# Patient Record
Sex: Male | Born: 1945
Health system: Southern US, Community
[De-identification: ages and names within clinical notes are randomized; demographics above are authoritative.]

## PROBLEM LIST (undated history)

## (undated) DIAGNOSIS — E119 Type 2 diabetes mellitus without complications: Secondary | ICD-10-CM

## (undated) DIAGNOSIS — I10 Essential (primary) hypertension: Secondary | ICD-10-CM

## (undated) DIAGNOSIS — E785 Hyperlipidemia, unspecified: Secondary | ICD-10-CM

## (undated) DIAGNOSIS — M1990 Unspecified osteoarthritis, unspecified site: Secondary | ICD-10-CM

## (undated) DIAGNOSIS — G2581 Restless legs syndrome: Secondary | ICD-10-CM

## (undated) DIAGNOSIS — N529 Male erectile dysfunction, unspecified: Secondary | ICD-10-CM

## (undated) DIAGNOSIS — B029 Zoster without complications: Secondary | ICD-10-CM

## (undated) HISTORY — PX: JOINT REPLACEMENT: SHX530

## (undated) HISTORY — DX: Unspecified osteoarthritis, unspecified site: M19.90

## (undated) HISTORY — DX: Essential (primary) hypertension: I10

## (undated) HISTORY — DX: Hyperlipidemia, unspecified: E78.5

## (undated) HISTORY — DX: Male erectile dysfunction, unspecified: N52.9

## (undated) HISTORY — DX: Restless legs syndrome: G25.81

## (undated) HISTORY — PX: SPINE SURGERY: SHX786

## (undated) HISTORY — DX: Type 2 diabetes mellitus without complications: E11.9

## (undated) HISTORY — DX: Zoster without complications: B02.9

---

## 2000-02-07 ENCOUNTER — Encounter: Admission: RE | Admit: 2000-02-07 | Discharge: 2000-02-07 | Payer: Self-pay | Admitting: Infectious Diseases

## 2007-10-25 ENCOUNTER — Ambulatory Visit (HOSPITAL_COMMUNITY): Admission: RE | Admit: 2007-10-25 | Discharge: 2007-10-25 | Payer: Self-pay | Admitting: Urology

## 2010-11-09 ENCOUNTER — Encounter: Payer: Self-pay | Admitting: Physician Assistant

## 2010-11-09 DIAGNOSIS — E119 Type 2 diabetes mellitus without complications: Secondary | ICD-10-CM | POA: Insufficient documentation

## 2010-11-09 DIAGNOSIS — I10 Essential (primary) hypertension: Secondary | ICD-10-CM

## 2010-12-09 LAB — HM DIABETES EYE EXAM

## 2010-12-14 ENCOUNTER — Encounter: Payer: Self-pay | Admitting: Physician Assistant

## 2010-12-14 NOTE — Op Note (Signed)
NAMEWILLIAN, DONSON                 ACCOUNT NO.:  [id number]   MEDICAL RECORD NO.:  [id number]          PATIENT TYPE:  AMB   LOCATION:  DAY                          FACILITY:  [facility]   PHYSICIAN:  Excell Seltzer. Annabell Howells, M.D.    DATE OF BIRTH:  [birth year]   DATE OF PROCEDURE:  10/25/2007  DATE OF DISCHARGE:                               OPERATIVE REPORT   PREOPERATIVE DIAGNOSIS:  Phimosis and balanitis.   POSTOPERATIVE DIAGNOSIS:  Phimosis and balanitis.   SURGEON:  Dr. Bjorn Pippin.   ANESTHESIA:  General.   SPECIMEN:  None.   COMPLICATIONS:  None.   INDICATIONS:  Bary is a 65 year old white male sent by Dr. Elana Alm for  progressive phimosis and intermittent balanitis.  He has elected  circumcision for therapy.   PROCEDURE:  The patient was taken to the operating room, where general  anesthetic was induced.  He was placed in the supine position.  His  genitalia was prepped with Betadine solution and draped in the usual  sterile fashion.  Dorsal and ventral slits were made and the redundant  wings of foreskin were excised.  Bleeding was controlled with the Bovie.  The skin edges were reapproximated with running 4-0 chromic interrupted  in the quadrants.  A dressing of Xeroform, Kling and Coban was placed,  and a penile block was performed with 0.5% Marcaine 8 mL.  His  anesthetic was reversed and he was moved to the recovery room in stable  condition.  There were no complications.      Excell Seltzer. Annabell Howells, M.D.  Electronically Signed     JJW/MEDQ  D:  10/25/2007  T:  10/25/2007  Job:  [id number]   cc:   S. Kyra Manges, M.D.  Fax: [phone]

## 2011-04-25 LAB — HEMOGLOBIN AND HEMATOCRIT, BLOOD
HCT: 39.3
Hemoglobin: 13.4

## 2011-04-25 LAB — URINALYSIS, ROUTINE W REFLEX MICROSCOPIC
Nitrite: NEGATIVE
Specific Gravity, Urine: 1.017
Urobilinogen, UA: 0.2

## 2011-04-25 LAB — BASIC METABOLIC PANEL
Chloride: 107
GFR calc Af Amer: >60
Potassium: 4.5
Sodium: 142

## 2012-10-15 ENCOUNTER — Other Ambulatory Visit: Payer: Self-pay | Admitting: Family Medicine

## 2012-10-15 LAB — COMPLETE METABOLIC PANEL WITH GFR
ALT: 16 U/L (ref 0–53)
AST: 18 U/L (ref 0–37)
Albumin: 4.3 g/dL (ref 3.5–5.2)
Alkaline Phosphatase: 57 U/L (ref 39–117)
BUN: 22 mg/dL (ref 6–23)
CO2: 30 mEq/L (ref 19–32)
Calcium: 9.9 mg/dL (ref 8.4–10.5)
Chloride: 101 mEq/L (ref 96–112)
Creat: 1.13 mg/dL (ref 0.50–1.35)
GFR, Est African American: 78 mL/min
GFR, Est Non African American: 67 mL/min
Glucose, Bld: 115 mg/dL — ABNORMAL HIGH (ref 70–99)
Potassium: 4.5 mEq/L (ref 3.5–5.3)
Sodium: 139 mEq/L (ref 135–145)
Total Bilirubin: 0.9 mg/dL (ref 0.3–1.2)
Total Protein: 7.4 g/dL (ref 6.0–8.3)

## 2012-10-16 LAB — MICROALBUMIN, URINE: Microalb, Ur: 1.61 mg/dL (ref 0.00–1.89)

## 2012-10-18 LAB — NMR LIPOPROFILE WITH LIPIDS
Cholesterol, Total: 170 mg/dL (ref <200)
HDL Particle Number: 29.9 umol/L — ABNORMAL LOW (ref >=30.5)
HDL Size: 8.5 nm — ABNORMAL LOW (ref >=9.2)
HDL-C: 44 mg/dL (ref >=40)
LDL (calc): 112 mg/dL — ABNORMAL HIGH (ref <100)
LDL Particle Number: 1137 nmol/L — ABNORMAL HIGH (ref <1000)
LDL Size: 21.7 nm (ref >20.5)
LP-IR Score: 51 — ABNORMAL HIGH (ref <=45)
Large HDL-P: 2.3 umol/L — ABNORMAL LOW (ref >=4.8)
Large VLDL-P: 1.8 nmol/L (ref <=2.7)
Small LDL Particle Number: 253 nmol/L (ref <=527)
Triglycerides: 72 mg/dL (ref <150)
VLDL Size: 41.7 nm (ref >=46.6)

## 2012-12-02 ENCOUNTER — Other Ambulatory Visit: Payer: Self-pay | Admitting: Family Medicine

## 2012-12-30 ENCOUNTER — Other Ambulatory Visit: Payer: Self-pay | Admitting: Family Medicine

## 2013-01-27 ENCOUNTER — Other Ambulatory Visit: Payer: Self-pay | Admitting: Family Medicine

## 2013-02-14 ENCOUNTER — Encounter: Payer: Self-pay | Admitting: Family Medicine

## 2013-02-14 ENCOUNTER — Ambulatory Visit (INDEPENDENT_AMBULATORY_CARE_PROVIDER_SITE_OTHER): Payer: Medicare PPO | Admitting: Family Medicine

## 2013-02-14 VITALS — BP 124/73 | HR 56 | Temp 97.0°F | Ht 71.0 in | Wt 290.4 lb

## 2013-02-14 DIAGNOSIS — F17200 Nicotine dependence, unspecified, uncomplicated: Secondary | ICD-10-CM

## 2013-02-14 DIAGNOSIS — E669 Obesity, unspecified: Secondary | ICD-10-CM

## 2013-02-14 DIAGNOSIS — E785 Hyperlipidemia, unspecified: Secondary | ICD-10-CM

## 2013-02-14 DIAGNOSIS — N529 Male erectile dysfunction, unspecified: Secondary | ICD-10-CM | POA: Insufficient documentation

## 2013-02-14 DIAGNOSIS — M25559 Pain in unspecified hip: Secondary | ICD-10-CM

## 2013-02-14 DIAGNOSIS — M25552 Pain in left hip: Secondary | ICD-10-CM | POA: Insufficient documentation

## 2013-02-14 DIAGNOSIS — I10 Essential (primary) hypertension: Secondary | ICD-10-CM

## 2013-02-14 DIAGNOSIS — E119 Type 2 diabetes mellitus without complications: Secondary | ICD-10-CM

## 2013-02-14 LAB — COMPLETE METABOLIC PANEL WITH GFR
ALT: 20 U/L (ref 0–53)
AST: 19 U/L (ref 0–37)
Albumin: 4.2 g/dL (ref 3.5–5.2)
Alkaline Phosphatase: 49 U/L (ref 39–117)
BUN: 17 mg/dL (ref 6–23)
CO2: 28 mEq/L (ref 19–32)
Calcium: 9.5 mg/dL (ref 8.4–10.5)
Chloride: 102 mEq/L (ref 96–112)
Creat: 0.95 mg/dL (ref 0.50–1.35)
GFR, Est African American: >89 mL/min
GFR, Est Non African American: 82 mL/min
Glucose, Bld: 104 mg/dL — ABNORMAL HIGH (ref 70–99)
Potassium: 4.7 mEq/L (ref 3.5–5.3)
Sodium: 138 mEq/L (ref 135–145)
Total Bilirubin: 1.1 mg/dL (ref 0.3–1.2)
Total Protein: 7.1 g/dL (ref 6.0–8.3)

## 2013-02-14 LAB — POCT UA - MICROALBUMIN: Microalbumin Ur, POC: 20 mg/L

## 2013-02-14 LAB — POCT GLYCOSYLATED HEMOGLOBIN (HGB A1C): Hemoglobin A1C: 6.1

## 2013-02-14 MED ORDER — PIOGLITAZONE HCL 30 MG PO TABS: ORAL_TABLET | ORAL | Status: DC

## 2013-02-14 MED ORDER — LOSARTAN POTASSIUM-HCTZ 100-12.5 MG PO TABS: ORAL_TABLET | ORAL | Status: DC

## 2013-02-14 MED ORDER — PRAVASTATIN SODIUM 20 MG PO TABS: 20.0000 mg | ORAL_TABLET | Freq: Every day | ORAL | Status: DC

## 2013-02-14 MED ORDER — METHYLPREDNISOLONE ACETATE 80 MG/ML IJ SUSP
80.0000 mg | Freq: Once | INTRAMUSCULAR | Status: AC
  Administered 2013-02-14: 80 mg via INTRAMUSCULAR

## 2013-02-14 NOTE — Progress Notes (Signed)
Patient ID: Brett Jensen, male   DOB: 17-Aug-1945, 67 y.o.   MRN: [id number] SUBJECTIVE: CC: Chief Complaint  Patient presents with  . Follow-up    4 month follow up  c/o left hip pain   . Medication Refill    needs refillls     HPI: 1)Patient is here for follow up of Diabetes Mellitus: Symptoms of DM: Denies Nocturia ,Denies Urinary Frequency , denies Blurred vision ,deniesDizziness,denies.Dysuria,denies paresthesias, denies extremity pain or ulcers.Brett Kitchendenies chest pain. has had an annual eye exam. do check the feet. Does check CBGs. Average CBG:113 Denies episodes of hypoglycemia. Does have an emergency hypoglycemic plan. admits toCompliance with medications. Denies Problems with medications.  Breakfast:1 boiled egg Lunch: chicken sandwich Dinner: 2 tomato sandwiches.  2)Patient is here for follow up of hypertension: denies Headache;deniesChest Pain;denies weakness;denies Shortness of Breath or Orthopnea;denies Visual changes;denies palpitations;denies cough;denies pedal edema;denies symptoms of TIA or stroke; admits to Compliance with medications. denies Problems with medications.  3) left hip pain : 2 months ago started to hurt while he was walking. He wants to get a steroid shot in the left buttocks  Even though it may make his  Sugar go high.  Past Medical History  Diagnosis Date  . Diabetes mellitus without complication   . Hypertension   . Restless leg syndrome   . Hyperlipidemia   . Erectile dysfunction   . Shingles    Past Surgical History  Procedure Laterality Date  . Spine surgery      lower back  . Joint replacement Right     ankle   History   Social History  . Marital Status: Widowed    Spouse Name: N/A    Number of Children: N/A  . Years of Education: N/A   Occupational History  . Not on file.   Social History Main Topics  . Smoking status: Current Every Day Smoker    Types: Cigars  . Smokeless tobacco: Not on file  . Alcohol Use: No  . Drug  Use: No  . Sexually Active: Not on file   Other Topics Concern  . Not on file   Social History Narrative  . No narrative on file   Family History  Problem Relation Age of Onset  . Cancer Mother   . Cancer Father    Current Outpatient Prescriptions on File Prior to Visit  Medication Sig Dispense Refill  . losartan-hydrochlorothiazide (HYZAAR) 100-12.5 MG per tablet TAKE ONE TABLET BY MOUTH ONE TIME DAILY  30 tablet  2  . pioglitazone (ACTOS) 30 MG tablet TAKE ONE TABLET BY MOUTH ONE TIME DAILY  30 tablet  1   No current facility-administered medications on file prior to visit.   No Known Allergies  There is no immunization history on file for this patient. Prior to Admission medications   Medication Sig Start Date End Date Taking? Authorizing Provider  losartan-hydrochlorothiazide (HYZAAR) 100-12.5 MG per tablet TAKE ONE TABLET BY MOUTH ONE TIME DAILY 01/27/13  Yes Ileana Ladd, MD  pioglitazone (ACTOS) 30 MG tablet TAKE ONE TABLET BY MOUTH ONE TIME DAILY 12/30/12  Yes Ileana Ladd, MD  pravastatin (PRAVACHOL) 20 MG tablet Take by mouth daily.  01/27/13  Yes Historical Provider, MD    ROS: As above in the HPI. All other systems are stable or negative.  OBJECTIVE: APPEARANCE:  Patient in no acute distress.The patient appeared well nourished and normally developed. Acyanotic. Waist: VITAL SIGNS:BP 124/73  Pulse 56  Temp(Src) 97 F (36.1 C) (  Oral)  Ht 5\' 11"  (1.803 m)  Wt 290 lb 6.4 oz (131.725 kg)  BMI 40.52 kg/m2 WM obese  SKIN: warm and  Dry without overt rashes, tattoos and scars  HEAD and Neck: without JVD, Head and scalp: normal Eyes:No scleral icterus. Fundi normal, eye movements normal. Ears: Auricle normal, canal normal, Tympanic membranes normal, insufflation normal. Nose: normal Throat: normal Neck & thyroid: normal  CHEST & LUNGS: Chest wall: normal Lungs: Clear  CVS: Reveals the PMI to be normally located. Regular rhythm, First and Second Heart  sounds are normal,  absence of murmurs, rubs or gallops. Peripheral vasculature: Radial pulses: normal Dorsal pedis pulses: normal Posterior pulses: normal  ABDOMEN:  Appearance: obese Benign, no organomegaly, no masses, no Abdominal Aortic enlargement. No Guarding , no rebound. No Bruits. Bowel sounds: normal  RECTAL: N/A GU: N/A  EXTREMETIES: nonedematous. Both Femoral and Pedal pulses are normal.  MUSCULOSKELETAL:  Spine: normal Joints: left hip pain on ROM especially on internal rotation Right ankle surgical scars  NEUROLOGIC: oriented to time,place and person; nonfocal. Strength is normal Sensory is normal Reflexes are normal Cranial Nerves are normal.  ASSESSMENT: Left hip pain - Plan: methylPREDNISolone acetate (DEPO-MEDROL) injection 80 mg  Obesity, unspecified  HTN (hypertension) - Plan: losartan-hydrochlorothiazide (HYZAAR) 100-12.5 MG per tablet, COMPLETE METABOLIC PANEL WITH GFR  DM (diabetes mellitus) - Plan: pioglitazone (ACTOS) 30 MG tablet, POCT glycosylated hemoglobin (Hb A1C), POCT UA - Microalbumin, COMPLETE METABOLIC PANEL WITH GFR, Microalbumin, urine  Erectile dysfunction  HLD (hyperlipidemia) - Plan: pravastatin (PRAVACHOL) 20 MG tablet, COMPLETE METABOLIC PANEL WITH GFR, NMR Lipoprofile with Lipids    PLAN:      Dr Woodroe Mode Recommendations  Diet and Exercise discussed with patient.  For nutrition information, I recommend books:  1).Eat to Live by Dr Monico Hoar. 2).Prevent and Reverse Heart Disease by Dr Suzzette Righter. 3) Dr Katherina Right Book:  Program to Reverse Diabetes  Exercise recommendations are:  If unable to walk, then the patient can exercise in a chair 3 times a day. By flapping arms like a bird gently and raising legs outwards to the front.  If ambulatory, the patient can go for walks for 30 minutes 3 times a week. Then increase the intensity and duration as tolerated.  Goal is to try to attain exercise  frequency to 5 times a week.  If applicable: Best to perform resistance exercises (machines or weights) 2 days a week and cardio type exercises 3 days per week.  Counseled on Obesity and DM and  Smoking cessation Counseled on smoking cessation for 8 minutes. Lifestyle changes needed and weight loss needed Hand outs in the AVS .                             Orders Placed This Encounter  Procedures  . COMPLETE METABOLIC PANEL WITH GFR  . NMR Lipoprofile with Lipids  . Microalbumin, urine  . POCT glycosylated hemoglobin (Hb A1C)  . POCT UA - Microalbumin   Meds ordered this encounter  Medications  . DISCONTD: pravastatin (PRAVACHOL) 20 MG tablet    Sig: Take by mouth daily.   . pioglitazone (ACTOS) 30 MG tablet    Sig: TAKE ONE TABLET BY MOUTH ONE TIME DAILY    Dispense:  30 tablet    Refill:  11  . losartan-hydrochlorothiazide (HYZAAR) 100-12.5 MG per tablet    Sig: TAKE ONE TABLET BY MOUTH ONE TIME DAILY  Dispense:  30 tablet    Refill:  11  . pravastatin (PRAVACHOL) 20 MG tablet    Sig: Take 1 tablet (20 mg total) by mouth daily.    Dispense:  30 tablet    Refill:  11  . methylPREDNISolone acetate (DEPO-MEDROL) injection 80 mg    Sig:    Return in about 4 months (around 06/17/2013) for Recheck medical problems.  Wiktoria Hemrick P. Modesto Charon, M.D.

## 2013-02-14 NOTE — Progress Notes (Signed)
tolerarted injection well

## 2013-02-14 NOTE — Patient Instructions (Addendum)
Dr Woodroe Mode Recommendations  Diet and Exercise discussed with patient.  For nutrition information, I recommend books:  1).Eat to Live by Dr Monico Hoar. 2).Prevent and Reverse Heart Disease by Dr Suzzette Righter. 3) Dr Katherina Right Book:  Program to Reverse Diabetes  Exercise recommendations are:  If unable to walk, then the patient can exercise in a chair 3 times a day. By flapping arms like a bird gently and raising legs outwards to the front.  If ambulatory, the patient can go for walks for 30 minutes 3 times a week. Then increase the intensity and duration as tolerated.  Goal is to try to attain exercise frequency to 5 times a week.  If applicable: Best to perform resistance exercises (machines or weights) 2 days a week and cardio type exercises 3 days per week.  Smoking Cessation Quitting smoking is important to your health and has many advantages. However, it is not always easy to quit since nicotine is a very addictive drug. Often times, people try 3 times or more before being able to quit. This document explains the best ways for you to prepare to quit smoking. Quitting takes hard work and a lot of effort, but you can do it. ADVANTAGES OF QUITTING SMOKING  You will live longer, feel better, and live better.  Your body will feel the impact of quitting smoking almost immediately.  Within 20 minutes, blood pressure decreases. Your pulse returns to its normal level.  After 8 hours, carbon monoxide levels in the blood return to normal. Your oxygen level increases.  After 24 hours, the chance of having a heart attack starts to decrease. Your breath, hair, and body stop smelling like smoke.  After 48 hours, damaged nerve endings begin to recover. Your sense of taste and smell improve.  After 72 hours, the body is virtually free of nicotine. Your bronchial tubes relax and breathing becomes easier.  After 2 to 12 weeks, lungs can hold more air. Exercise becomes  easier and circulation improves.  The risk of having a heart attack, stroke, cancer, or lung disease is greatly reduced.  After 1 year, the risk of coronary heart disease is cut in half.  After 5 years, the risk of stroke falls to the same as a nonsmoker.  After 10 years, the risk of lung cancer is cut in half and the risk of other cancers decreases significantly.  After 15 years, the risk of coronary heart disease drops, usually to the level of a nonsmoker.  If you are pregnant, quitting smoking will improve your chances of having a healthy baby.  The people you live with, especially any children, will be healthier.  You will have extra money to spend on things other than cigarettes. QUESTIONS TO THINK ABOUT BEFORE ATTEMPTING TO QUIT You may want to talk about your answers with your caregiver.  Why do you want to quit?  If you tried to quit in the past, what helped and what did not?  What will be the most difficult situations for you after you quit? How will you plan to handle them?  Who can help you through the tough times? Your family? Friends? A caregiver?  What pleasures do you get from smoking? What ways can you still get pleasure if you quit? Here are some questions to ask your caregiver:  How can you help me to be successful at quitting?  What medicine do you think would be best for me and how should I  take it?  What should I do if I need more help?  What is smoking withdrawal like? How can I get information on withdrawal? GET READY  Set a quit date.  Change your environment by getting rid of all cigarettes, ashtrays, matches, and lighters in your home, car, or work. Do not let people smoke in your home.  Review your past attempts to quit. Think about what worked and what did not. GET SUPPORT AND ENCOURAGEMENT You have a better chance of being successful if you have help. You can get support in many ways.  Tell your family, friends, and co-workers that you are  going to quit and need their support. Ask them not to smoke around you.  Get individual, group, or telephone counseling and support. Programs are available at [employer] and health centers. Call your local health department for information about programs in your area.  Spiritual beliefs and practices may help some smokers quit.  Download a "quit meter" on your computer to keep track of quit statistics, such as how long you have gone without smoking, cigarettes not smoked, and money saved.  Get a self-help book about quitting smoking and staying off of tobacco. LEARN NEW SKILLS AND BEHAVIORS  Distract yourself from urges to smoke. Talk to someone, go for a walk, or occupy your time with a task.  Change your normal routine. Take a different route to work. Drink tea instead of coffee. Eat breakfast in a different place.  Reduce your stress. Take a hot bath, exercise, or read a book.  Plan something enjoyable to do every day. Reward yourself for not smoking.  Explore interactive web-based programs that specialize in helping you quit. GET MEDICINE AND USE IT CORRECTLY Medicines can help you stop smoking and decrease the urge to smoke. Combining medicine with the above behavioral methods and support can greatly increase your chances of successfully quitting smoking.  Nicotine replacement therapy helps deliver nicotine to your body without the negative effects and risks of smoking. Nicotine replacement therapy includes nicotine gum, lozenges, inhalers, nasal sprays, and skin patches. Some may be available over-the-counter and others require a prescription.  Antidepressant medicine helps people abstain from smoking, but how this works is unknown. This medicine is available by prescription.  Nicotinic receptor partial agonist medicine simulates the effect of nicotine in your brain. This medicine is available by prescription. Ask your caregiver for advice about which medicines to use and how  to use them based on your health history. Your caregiver will tell you what side effects to look out for if you choose to be on a medicine or therapy. Carefully read the information on the package. Do not use any other product containing nicotine while using a nicotine replacement product.  RELAPSE OR DIFFICULT SITUATIONS Most relapses occur within the first 3 months after quitting. Do not be discouraged if you start smoking again. Remember, most people try several times before finally quitting. You may have symptoms of withdrawal because your body is used to nicotine. You may crave cigarettes, be irritable, feel very hungry, cough often, get headaches, or have difficulty concentrating. The withdrawal symptoms are only temporary. They are strongest when you first quit, but they will go away within 10 14 days. To reduce the chances of relapse, try to:  Avoid drinking alcohol. Drinking lowers your chances of successfully quitting.  Reduce the amount of caffeine you consume. Once you quit smoking, the amount of caffeine in your body increases and can give you symptoms,  such as a rapid heartbeat, sweating, and anxiety.  Avoid smokers because they can make you want to smoke.  Do not let weight gain distract you. Many smokers will gain weight when they quit, usually less than 10 pounds. Eat a healthy diet and stay active. You can always lose the weight gained after you quit.  Find ways to improve your mood other than smoking. FOR MORE INFORMATION  www.smokefree.gov  Document Released: 07/12/2001 Document Revised: 01/17/2012 Document Reviewed: 10/27/2011 [facility] Patient Information 2014 [place], [place].   Obesity Obesity is defined as having too much total body fat and a body mass index (BMI) of 30 or more. BMI is an estimate of body fat and is calculated from your height and weight. Obesity happens when you consume more calories than you can burn by exercising or performing daily physical tasks.  Prolonged obesity can cause major illnesses or emergencies, such as:   A stroke.  Heart disease.  Diabetes.  Cancer.  Arthritis.  High blood pressure (hypertension).  High cholesterol.  Sleep apnea.  Erectile dysfunction.  Infertility problems. CAUSES   Regularly eating unhealthy foods.  Physical inactivity.  Certain disorders, such as an underactive thyroid (hypothyroidism), Cushing's syndrome, and polycystic ovarian syndrome.  Certain medicines, such as steroids, some depression medicines, and antipsychotics.  Genetics.  Lack of sleep. DIAGNOSIS  A caregiver can diagnose obesity after calculating your BMI. Obesity will be diagnosed if your BMI is 30 or higher.  There are other methods of measuring obesity levels. Some other methods include measuring your skin fold thickness, your waist circumference, and comparing your hip circumference to your waist circumference. TREATMENT  A healthy treatment program includes some or all of the following:  Long-term dietary changes.  Exercise and physical activity.  Behavioral and lifestyle changes.  Medicine only under the supervision of your caregiver. Medicines may help, but only if they are used with diet and exercise programs. An unhealthy treatment program includes:  Fasting.  Fad diets.  Supplements and drugs. These choices do not succeed in long-term weight control.  HOME CARE INSTRUCTIONS   Exercise and perform physical activity as directed by your caregiver. To increase physical activity, try the following:  Use stairs instead of elevators.  Park farther away from store entrances.  Garden, bike, or walk instead of watching television or using the computer.  Eat healthy, low-calorie foods and drinks on a regular basis. Eat more fruits and vegetables. Use low-calorie cookbooks or take healthy cooking classes.  Limit fast food, sweets, and processed snack foods.  Eat smaller portions.  Keep a daily  journal of everything you eat. There are many free websites to help you with this. It may be helpful to measure your foods so you can determine if you are eating the correct portion sizes.  Avoid drinking alcohol. Drink more water and drinks without calories.  Take vitamins and supplements only as recommended by your caregiver.  Weight-loss support groups, Optometrist, counselors, and stress reduction education can also be very helpful. SEEK IMMEDIATE MEDICAL CARE IF:  You have chest pain or tightness.  You have trouble breathing or feel short of breath.  You have weakness or leg numbness.  You feel confused or have trouble talking.  You have sudden changes in your vision. MAKE SURE YOU:  Understand these instructions.  Will watch your condition.  Will get help right away if you are not doing well or get worse. Document Released: 08/25/2004 Document Revised: 01/17/2012 Document Reviewed: 08/24/2011 [employer] Patient Information  2014 [place], [place]. Methylprednisolone Suspension for Injection What is this medicine? METHYLPREDNISOLONE (meth ill pred NISS oh lone) is a corticosteroid. It is commonly used to treat inflammation of the skin, joints, lungs, and other organs. Common conditions treated include asthma, allergies, and arthritis. It is also used for other conditions, such as blood disorders and diseases of the adrenal glands. This medicine may be used for other purposes; ask your health care provider or pharmacist if you have questions. What should I tell my health care provider before I take this medicine? They need to know if you have any of these conditions: -cataracts or glaucoma -Cushings -heart disease -high blood pressure -infection including tuberculosis -low calcium or potassium levels in the blood -recent surgery -seizures -stomach or intestinal disease, including colitis -threadworms -thyroid problems -an unusual or allergic reaction to  methylprednisolone, corticosteroids, benzyl alcohol, other medicines, foods, dyes, or preservatives -pregnant or trying to get pregnant -breast-feeding How should I use this medicine? This medicine is for injection into a muscle, joint, or other tissue. It is given by a health care professional in a hospital or clinic setting. Talk to your pediatrician regarding the use of this medicine in children. While this drug may be prescribed for selected conditions, precautions do apply. Overdosage: If you think you have taken too much of this medicine contact a poison control center or emergency room at once. NOTE: This medicine is only for you. Do not share this medicine with others. What if I miss a dose? This does not apply. What may interact with this medicine? Do not take this medicine with any of the following medications: -mifepristone -radiopaque contrast agents This medicine may also interact with the following medications: -aspirin and aspirin-like medicines -cyclosporin -ketoconazole -phenobarbital -phenytoin -rifampin -tacrolimus -troleandomycin -vaccines -warfarin This list may not describe all possible interactions. Give your health care provider a list of all the medicines, herbs, non-prescription drugs, or dietary supplements you use. Also tell them if you smoke, drink alcohol, or use illegal drugs. Some items may interact with your medicine. What should I watch for while using this medicine? Visit your doctor or health care professional for regular checks on your progress. If you are taking this medicine for a long time, carry an identification card with your name and address, the type and dose of your medicine, and your doctor's name and address. The medicine may increase your risk of getting an infection. Stay away from people who are sick. Tell your doctor or health care professional if you are around anyone with measles or chickenpox. You may need to avoid some vaccines.  Talk to your health care provider for more information. If you are going to have surgery, tell your doctor or health care professional that you have taken this medicine within the last twelve months. Ask your doctor or health care professional about your diet. You may need to lower the amount of salt you eat. The medicine can increase your blood sugar. If you are a diabetic check with your doctor if you need help adjusting the dose of your diabetic medicine. What side effects may I notice from receiving this medicine? Side effects that you should report to your doctor or health care professional as soon as possible: -allergic reactions like skin rash, itching or hives, swelling of the face, lips, or tongue -bloody or tarry stools -changes in vision -eye pain or bulging eyes -fever, sore throat, sneezing, cough, or other signs of infection, wounds that will not heal -increased thirst -irregular  heartbeat -muscle cramps -pain in hips, back, ribs, arms, shoulders, or legs -swelling of the ankles, feet, hands -trouble passing urine or change in the amount of urine -unusual bleeding or bruising -unusually weak or tired -weight gain or weight loss Side effects that usually do not require medical attention (report to your doctor or health care professional if they continue or are bothersome): -changes in emotions or moods -constipation or diarrhea -headache -irritation at site where injected -nausea, vomiting -skin problems, acne, thin and shiny skin -trouble sleeping -unusual hair growth on the face or body This list may not describe all possible side effects. Call your doctor for medical advice about side effects. You may report side effects to FDA at 1-800-FDA-1088. Where should I keep my medicine? This drug is given in a hospital or clinic and will not be stored at home. NOTE: This sheet is a summary. It may not cover all possible information. If you have questions about this medicine,  talk to your doctor, pharmacist, or health care provider.  2013, [employer]. (02/06/2008 2:36:31 PM)

## 2013-02-15 LAB — MICROALBUMIN, URINE: Microalb, Ur: 1.88 mg/dL (ref 0.00–1.89)

## 2013-02-15 LAB — NMR LIPOPROFILE WITH LIPIDS
Cholesterol, Total: 146 mg/dL (ref <200)
HDL Particle Number: 30.1 umol/L — ABNORMAL LOW (ref >=30.5)
HDL Size: 8.7 nm — ABNORMAL LOW (ref >=9.2)
HDL-C: 44 mg/dL (ref >=40)
LDL (calc): 81 mg/dL (ref <100)
LDL Particle Number: 940 nmol/L (ref <1000)
LDL Size: 21.4 nm (ref >20.5)
LP-IR Score: 54 — ABNORMAL HIGH (ref <=45)
Large HDL-P: 2.8 umol/L — ABNORMAL LOW (ref >=4.8)
Large VLDL-P: 2.8 nmol/L — ABNORMAL HIGH (ref <=2.7)
Small LDL Particle Number: 261 nmol/L (ref <=527)
Triglycerides: 103 mg/dL (ref <150)
VLDL Size: 45.9 nm (ref <=46.6)

## 2013-02-17 NOTE — Progress Notes (Signed)
Quick Note:  Lab result at goal. No change in Medications for now. No Change in plans and follow up. ______ 

## 2013-03-05 ENCOUNTER — Encounter: Payer: Self-pay | Admitting: *Deleted

## 2013-03-06 ENCOUNTER — Other Ambulatory Visit: Payer: Self-pay

## 2013-04-16 ENCOUNTER — Encounter: Payer: Self-pay | Admitting: *Deleted

## 2013-06-06 ENCOUNTER — Other Ambulatory Visit: Payer: Self-pay

## 2013-06-18 ENCOUNTER — Ambulatory Visit: Payer: Medicare PPO | Admitting: Family Medicine

## 2013-06-24 ENCOUNTER — Ambulatory Visit (INDEPENDENT_AMBULATORY_CARE_PROVIDER_SITE_OTHER): Payer: Medicare PPO | Admitting: Family Medicine

## 2013-06-24 ENCOUNTER — Encounter: Payer: Self-pay | Admitting: Family Medicine

## 2013-06-24 VITALS — BP 131/70 | HR 62 | Temp 97.6°F | Ht 71.0 in | Wt 294.2 lb

## 2013-06-24 DIAGNOSIS — M25559 Pain in unspecified hip: Secondary | ICD-10-CM

## 2013-06-24 DIAGNOSIS — M25552 Pain in left hip: Secondary | ICD-10-CM

## 2013-06-24 DIAGNOSIS — E669 Obesity, unspecified: Secondary | ICD-10-CM

## 2013-06-24 DIAGNOSIS — E119 Type 2 diabetes mellitus without complications: Secondary | ICD-10-CM

## 2013-06-24 DIAGNOSIS — E785 Hyperlipidemia, unspecified: Secondary | ICD-10-CM | POA: Insufficient documentation

## 2013-06-24 DIAGNOSIS — E1169 Type 2 diabetes mellitus with other specified complication: Secondary | ICD-10-CM | POA: Insufficient documentation

## 2013-06-24 DIAGNOSIS — N529 Male erectile dysfunction, unspecified: Secondary | ICD-10-CM

## 2013-06-24 DIAGNOSIS — Z23 Encounter for immunization: Secondary | ICD-10-CM | POA: Insufficient documentation

## 2013-06-24 DIAGNOSIS — I10 Essential (primary) hypertension: Secondary | ICD-10-CM

## 2013-06-24 LAB — POCT GLYCOSYLATED HEMOGLOBIN (HGB A1C): Hemoglobin A1C: 5.5

## 2013-06-24 NOTE — Patient Instructions (Signed)
Diabetes and Foot Care Diabetes may cause you to have problems because of poor blood supply (circulation) to your feet and legs. This may cause the skin on your feet to become thinner, break easier, and heal more slowly. Your skin may become dry, and the skin may peel and crack. You may also have nerve damage in your legs and feet causing decreased feeling in them. You may not notice minor injuries to your feet that could lead to infections or more serious problems. Taking care of your feet is one of the most important things you can do for yourself.  HOME CARE INSTRUCTIONS  Wear shoes at all times, even in the house. Do not go barefoot. Bare feet are easily injured.  Check your feet daily for blisters, cuts, and redness. If you cannot see the bottom of your feet, use a mirror or ask someone for help.  Wash your feet with warm water (do not use hot water) and mild soap. Then pat your feet and the areas between your toes until they are completely dry. Do not soak your feet as this can dry your skin.  Apply a moisturizing lotion or petroleum jelly (that does not contain alcohol and is unscented) to the skin on your feet and to dry, brittle toenails. Do not apply lotion between your toes.  Trim your toenails straight across. Do not dig under them or around the cuticle. File the edges of your nails with an emery board or nail file.  Do not cut corns or calluses or try to remove them with medicine.  Wear clean socks or stockings every day. Make sure they are not too tight. Do not wear knee-high stockings since they may decrease blood flow to your legs.  Wear shoes that fit properly and have enough cushioning. To break in new shoes, wear them for just a few hours a day. This prevents you from injuring your feet. Always look in your shoes before you put them on to be sure there are no objects inside.  Do not cross your legs. This may decrease the blood flow to your feet.  If you find a minor scrape,  cut, or break in the skin on your feet, keep it and the skin around it clean and dry. These areas may be cleansed with mild soap and water. Do not cleanse the area with peroxide, alcohol, or iodine.  When you remove an adhesive bandage, be sure not to damage the skin around it.  If you have a wound, look at it several times a day to make sure it is healing.  Do not use heating pads or hot water bottles. They may burn your skin. If you have lost feeling in your feet or legs, you may not know it is happening until it is too late.  Make sure your health care provider performs a complete foot exam at least annually or more often if you have foot problems. Report any cuts, sores, or bruises to your health care provider immediately. SEEK MEDICAL CARE IF:   You have an injury that is not healing.  You have cuts or breaks in the skin.  You have an ingrown nail.  You notice redness on your legs or feet.  You feel burning or tingling in your legs or feet.  You have pain or cramps in your legs and feet.  Your legs or feet are numb.  Your feet always feel cold. SEEK IMMEDIATE MEDICAL CARE IF:   There is increasing redness,   swelling, or pain in or around a wound.  There is a red line that goes up your leg.  Pus is coming from a wound.  You develop a fever or as directed by your health care provider.  You notice a bad smell coming from an ulcer or wound. Document Released: 07/15/2000 Document Revised: 03/20/2013 Document Reviewed: 12/25/2012 [employer] Patient Information 2014 [employer].        Dr Jaimee Corum's Recommendations  For nutrition information, I recommend books:  1).Eat to Live by Dr Joel Fuhrman. 2).Prevent and Reverse Heart Disease by Dr Caldwell Esselstyn. 3) Dr Neal Barnard's Book:  Program to Reverse Diabetes  Exercise recommendations are:  If unable to walk, then the patient can exercise in a chair 3 times a day. By flapping arms like a bird gently and  raising legs outwards to the front.  If ambulatory, the patient can go for walks for 30 minutes 3 times a week. Then increase the intensity and duration as tolerated.  Goal is to try to attain exercise frequency to 5 times a week.  If applicable: Best to perform resistance exercises (machines or weights) 2 days a week and cardio type exercises 3 days per week.  

## 2013-06-24 NOTE — Progress Notes (Signed)
  Subjective:    Patient ID: Brett Jensen, male    DOB: June 05, 1946, 67 y.o.   MRN: [id number]  HPI  4 MONTH FOLLOW UP             Patient Active Problem List   Diagnosis Date Noted  . Obesity, unspecified 02/14/2013  . Left hip pain 02/14/2013  . Erectile dysfunction 02/14/2013  . HTN (hypertension) 11/09/2010  . DM (diabetes mellitus) 11/09/2010    Current outpatient prescriptions:losartan-hydrochlorothiazide (HYZAAR) 100-12.5 MG per tablet, TAKE ONE TABLET BY MOUTH ONE TIME DAILY, Disp: 30 tablet, Rfl: 11;  pioglitazone (ACTOS) 30 MG tablet, TAKE ONE TABLET BY MOUTH ONE TIME DAILY, Disp: 30 tablet, Rfl: 11;  pravastatin (PRAVACHOL) 20 MG tablet, Take 1 tablet (20 mg total) by mouth daily., Disp: 30 tablet, Rfl: 11           Review of Systems     Objective:   Physical Exam BP 131/70  Pulse 62  Temp(Src) 97.6 F (36.4 C) (Oral)  Ht 5\' 11"  (1.803 m)  Wt 294 lb 3.2 oz (133.448 kg)  BMI 41.05 kg/m2           Assessment & Plan:

## 2013-06-24 NOTE — Progress Notes (Signed)
Patient ID: Brett Jensen, male   DOB: 1946/01/18, 67 y.o.   MRN: [id number] SUBJECTIVE: CC: Chief Complaint  Patient presents with  . Follow-up    4 MONTH FOLLOW UP     HPI: Patient is here for follow up of Diabetes Mellitus/HTN/HLD/ED: Symptoms evaluated: Denies Nocturia ,Denies Urinary Frequency , denies Blurred vision ,deniesDizziness,denies.Dysuria,denies paresthesias, denies extremity pain or ulcers.Marland Kitchendenies chest pain. has had an annual eye exam. do check the feet. Does check CBGs. Average CBG:105-108 Denies episodes of hypoglycemia. Does have an emergency hypoglycemic plan. admits toCompliance with medications. Denies Problems with medications.  Past Medical History  Diagnosis Date  . Diabetes mellitus without complication   . Hypertension   . Restless leg syndrome   . Erectile dysfunction   . Shingles   . Hyperlipidemia    Past Surgical History  Procedure Laterality Date  . Spine surgery      lower back  . Joint replacement Right     ankle   History   Social History  . Marital Status: Widowed    Spouse Name: N/A    Number of Children: N/A  . Years of Education: N/A   Occupational History  . Not on file.   Social History Main Topics  . Smoking status: Current Every Day Smoker    Types: Cigars  . Smokeless tobacco: Not on file  . Alcohol Use: No  . Drug Use: No  . Sexual Activity: Not on file   Other Topics Concern  . Not on file   Social History Narrative  . No narrative on file   Family History  Problem Relation Age of Onset  . Cancer Mother   . Cancer Father    Current Outpatient Prescriptions on File Prior to Visit  Medication Sig Dispense Refill  . losartan-hydrochlorothiazide (HYZAAR) 100-12.5 MG per tablet TAKE ONE TABLET BY MOUTH ONE TIME DAILY  30 tablet  11  . pioglitazone (ACTOS) 30 MG tablet TAKE ONE TABLET BY MOUTH ONE TIME DAILY  30 tablet  11  . pravastatin (PRAVACHOL) 20 MG tablet Take 1 tablet (20 mg total) by mouth daily.  30  tablet  11   No current facility-administered medications on file prior to visit.   No Known Allergies Immunization History  Administered Date(s) Administered  . Influenza,inj,Quad PF,36+ Mos 06/24/2013   Prior to Admission medications   Medication Sig Start Date End Date Taking? Authorizing Provider  losartan-hydrochlorothiazide (HYZAAR) 100-12.5 MG per tablet TAKE ONE TABLET BY MOUTH ONE TIME DAILY 02/14/13  Yes Ileana Ladd, MD  pioglitazone (ACTOS) 30 MG tablet TAKE ONE TABLET BY MOUTH ONE TIME DAILY 02/14/13  Yes Ileana Ladd, MD  pravastatin (PRAVACHOL) 20 MG tablet Take 1 tablet (20 mg total) by mouth daily. 02/14/13  Yes Ileana Ladd, MD     ROS: As above in the HPI. All other systems are stable or negative.  OBJECTIVE: APPEARANCE:  Patient in no acute distress.The patient appeared well nourished and normally developed. Acyanotic. Waist: VITAL SIGNS:BP 131/70  Pulse 62  Temp(Src) 97.6 F (36.4 C) (Oral)  Ht 5\' 11"  (1.803 m)  Wt 294 lb 3.2 oz (133.448 kg)  BMI 41.05 kg/m2 WM obese  SKIN: warm and  Dry without overt rashes, tattoos and scars  HEAD and Neck: without JVD, Head and scalp: normal Eyes:No scleral icterus. Fundi normal, eye movements normal. Ears: Auricle normal, canal normal, Tympanic membranes normal, insufflation normal. Nose: normal Throat: normal Neck & thyroid: normal  CHEST & LUNGS:  Chest wall: normal Lungs: Clear  CVS: Reveals the PMI to be normally located. Regular rhythm, First and Second Heart sounds are normal,  absence of murmurs, rubs or gallops. Peripheral vasculature: Radial pulses: normal Dorsal pedis pulses: normal Posterior pulses: normal  ABDOMEN:  Appearance: Obese Benign, no organomegaly, no masses, no Abdominal Aortic enlargement. No Guarding , no rebound. No Bruits. Bowel sounds: normal  RECTAL: N/A GU: N/A  EXTREMETIES: nonedematous.  MUSCULOSKELETAL:  Spine: normal Joints: intact  NEUROLOGIC:  oriented to time,place and person; nonfocal. Strength is normal Sensory is normal Reflexes are normal Cranial Nerves are normal.  DM foot exam: see module  Results for orders placed in visit on 06/24/13  POCT GLYCOSYLATED HEMOGLOBIN (HGB A1C)      Result Value Range   Hemoglobin A1C 5.5      ASSESSMENT:  DM (diabetes mellitus) - Plan: POCT glycosylated hemoglobin (Hb A1C), CMP14+EGFR  HTN (hypertension)  HLD (hyperlipidemia) - Plan: Lipid panel  Need for prophylactic vaccination and inoculation against influenza  Erectile dysfunction - Plan: CMP14+EGFR  Left hip pain  Obesity, unspecified  PLAN:  Orders Placed This Encounter  Procedures  . CMP14+EGFR  . Lipid panel  . POCT glycosylated hemoglobin (Hb A1C)   Meds ordered this encounter  Medications  . aspirin EC 81 MG tablet    Sig: Take 81 mg by mouth daily.        Dr Woodroe Mode Recommendations  For nutrition information, I recommend books:  1).Eat to Live by Dr Monico Hoar. 2).Prevent and Reverse Heart Disease by Dr Suzzette Righter. 3) Dr Katherina Right Book:  Program to Reverse Diabetes  Exercise recommendations are:  If unable to walk, then the patient can exercise in a chair 3 times a day. By flapping arms like a bird gently and raising legs outwards to the front.  If ambulatory, the patient can go for walks for 30 minutes 3 times a week. Then increase the intensity and duration as tolerated.  Goal is to try to attain exercise frequency to 5 times a week.  If applicable: Best to perform resistance exercises (machines or weights) 2 days a week and cardio type exercises 3 days per week.  Smoking cessation counselling.  There are no discontinued medications. Return in about 6 months (around 12/22/2013) for Recheck medical problems.  Cassie Henkels P. Modesto Charon, M.D.

## 2013-06-25 LAB — LIPID PANEL
Chol/HDL Ratio: 3.5 ratio units (ref 0.0–5.0)
Cholesterol, Total: 160 mg/dL (ref 100–199)
HDL: 46 mg/dL (ref >39)
LDL Calculated: 93 mg/dL (ref 0–99)
Triglycerides: 107 mg/dL (ref 0–149)
VLDL Cholesterol Cal: 21 mg/dL (ref 5–40)

## 2013-06-25 LAB — CMP14+EGFR
ALT: 15 IU/L (ref 0–44)
AST: 19 IU/L (ref 0–40)
Albumin/Globulin Ratio: 1.7 (ref 1.1–2.5)
Albumin: 4.3 g/dL (ref 3.6–4.8)
Alkaline Phosphatase: 59 IU/L (ref 39–117)
BUN/Creatinine Ratio: 15 (ref 10–22)
BUN: 15 mg/dL (ref 8–27)
CO2: 25 mmol/L (ref 18–29)
Calcium: 9.5 mg/dL (ref 8.6–10.2)
Chloride: 99 mmol/L (ref 97–108)
Creatinine, Ser: 0.99 mg/dL (ref 0.76–1.27)
GFR calc Af Amer: 91 mL/min/1.73
GFR calc non Af Amer: 78 mL/min/1.73
Globulin, Total: 2.6 g/dL (ref 1.5–4.5)
Glucose: 120 mg/dL — ABNORMAL HIGH (ref 65–99)
Potassium: 4.6 mmol/L (ref 3.5–5.2)
Sodium: 140 mmol/L (ref 134–144)
Total Bilirubin: 0.8 mg/dL (ref 0.0–1.2)
Total Protein: 6.9 g/dL (ref 6.0–8.5)

## 2013-09-03 ENCOUNTER — Other Ambulatory Visit: Payer: Self-pay | Admitting: Family Medicine

## 2013-11-08 ENCOUNTER — Encounter: Payer: Self-pay | Admitting: *Deleted

## 2013-12-24 ENCOUNTER — Ambulatory Visit: Payer: Medicare PPO | Admitting: General Practice

## 2013-12-24 ENCOUNTER — Encounter: Payer: Self-pay | Admitting: Family Medicine

## 2013-12-24 ENCOUNTER — Ambulatory Visit (INDEPENDENT_AMBULATORY_CARE_PROVIDER_SITE_OTHER): Payer: Medicare PPO | Admitting: Family Medicine

## 2013-12-24 ENCOUNTER — Ambulatory Visit: Payer: Medicare PPO | Admitting: Family Medicine

## 2013-12-24 VITALS — BP 137/78 | HR 66 | Temp 97.6°F | Ht 71.0 in | Wt 293.6 lb

## 2013-12-24 DIAGNOSIS — E785 Hyperlipidemia, unspecified: Secondary | ICD-10-CM

## 2013-12-24 DIAGNOSIS — E119 Type 2 diabetes mellitus without complications: Secondary | ICD-10-CM

## 2013-12-24 DIAGNOSIS — I10 Essential (primary) hypertension: Secondary | ICD-10-CM

## 2013-12-24 LAB — POCT CBC
Granulocyte percent: 59.1 %G (ref 37–80)
HCT, POC: 43.3 % — AB (ref 43.5–53.7)
Hemoglobin: 13.8 g/dL — AB (ref 14.1–18.1)
Lymph, poc: 3.3 (ref 0.6–3.4)
MCH, POC: 28.2 pg (ref 27–31.2)
MCHC: 31.8 g/dL (ref 31.8–35.4)
MCV: 88.7 fL (ref 80–97)
MPV: 8.1 fL (ref 0–99.8)
POC Granulocyte: 5.1 (ref 2–6.9)
POC LYMPH PERCENT: 38.2 %L (ref 10–50)
Platelet Count, POC: 271 10*3/uL (ref 142–424)
RBC: 4.9 M/uL (ref 4.69–6.13)
RDW, POC: 12.8 %
WBC: 8.6 10*3/uL (ref 4.6–10.2)

## 2013-12-24 LAB — POCT GLYCOSYLATED HEMOGLOBIN (HGB A1C): Hemoglobin A1C: 6.1

## 2013-12-24 MED ORDER — GLUCOSE BLOOD VI STRP: 1.0000 | ORAL_STRIP | Status: DC | PRN

## 2013-12-24 MED ORDER — PIOGLITAZONE HCL 30 MG PO TABS: ORAL_TABLET | ORAL | Status: DC

## 2013-12-24 MED ORDER — LOSARTAN POTASSIUM-HCTZ 100-12.5 MG PO TABS: ORAL_TABLET | ORAL | Status: DC

## 2013-12-24 MED ORDER — PRAVASTATIN SODIUM 20 MG PO TABS: 20.0000 mg | ORAL_TABLET | Freq: Every day | ORAL | Status: DC

## 2013-12-24 NOTE — Progress Notes (Signed)
   Subjective:    Patient ID: Brett Jensen, male    DOB: 07-11-1946, 68 y.o.   MRN: [id number]  HPI This 68 y.o. male presents for evaluation of routine follow up.  He has hx of hypertension and diabetes.   Review of Systems    No chest pain, SOB, HA, dizziness, vision change, N/V, diarrhea, constipation, dysuria, urinary urgency or frequency, myalgias, arthralgias or rash.  Objective:   Physical Exam  Vital signs noted  Well developed well nourished male.  HEENT - Head atraumatic Normocephalic                Eyes - PERRLA, Conjuctiva - clear Sclera- Clear EOMI                Ears - EAC's Wnl TM's Wnl Gross Hearing WNL                Nose - Nares patent                 Throat - oropharanx wnl Respiratory - Lungs CTA bilateral Cardiac - RRR S1 and S2 without murmur GI - Abdomen soft Nontender and bowel sounds active x 4 Extremities - No edema. Neuro - Grossly intact.      Assessment & Plan:  Hyperlipemia - Plan: Lipid panel  Diabetes mellitus, type 2 - Plan: POCT CBC, POCT glycosylated hemoglobin (Hb A1C), CMP14+EGFR  Hypertension - Plan: POCT CBC  HTN (hypertension) - Plan: losartan-hydrochlorothiazide (HYZAAR) 100-12.5 MG per tablet  DM (diabetes mellitus) - Plan: pioglitazone (ACTOS) 30 MG tablet  HLD (hyperlipidemia) - Plan: pravastatin (PRAVACHOL) 20 MG tablet  Lysbeth Penner FNP

## 2013-12-25 LAB — CMP14+EGFR
ALT: 20 IU/L (ref 0–44)
AST: 24 IU/L (ref 0–40)
Albumin/Globulin Ratio: 1.7 (ref 1.1–2.5)
Albumin: 4.3 g/dL (ref 3.6–4.8)
Alkaline Phosphatase: 62 IU/L (ref 39–117)
BUN/Creatinine Ratio: 15 (ref 10–22)
BUN: 17 mg/dL (ref 8–27)
CO2: 26 mmol/L (ref 18–29)
Calcium: 9.5 mg/dL (ref 8.6–10.2)
Chloride: 101 mmol/L (ref 97–108)
Creatinine, Ser: 1.13 mg/dL (ref 0.76–1.27)
GFR calc Af Amer: 77 mL/min/{1.73_m2} (ref >59)
GFR calc non Af Amer: 67 mL/min/{1.73_m2} (ref >59)
Globulin, Total: 2.6 g/dL (ref 1.5–4.5)
Glucose: 120 mg/dL — ABNORMAL HIGH (ref 65–99)
Potassium: 4.6 mmol/L (ref 3.5–5.2)
Sodium: 141 mmol/L (ref 134–144)
Total Bilirubin: 0.7 mg/dL (ref 0.0–1.2)
Total Protein: 6.9 g/dL (ref 6.0–8.5)

## 2013-12-25 LAB — LIPID PANEL
Chol/HDL Ratio: 3.7 ratio units (ref 0.0–5.0)
Cholesterol, Total: 164 mg/dL (ref 100–199)
HDL: 44 mg/dL (ref >39)
LDL Calculated: 97 mg/dL (ref 0–99)
Triglycerides: 115 mg/dL (ref 0–149)
VLDL Cholesterol Cal: 23 mg/dL (ref 5–40)

## 2014-04-23 ENCOUNTER — Encounter: Payer: Self-pay | Admitting: *Deleted

## 2014-05-16 ENCOUNTER — Other Ambulatory Visit: Payer: Self-pay

## 2014-06-20 ENCOUNTER — Ambulatory Visit (INDEPENDENT_AMBULATORY_CARE_PROVIDER_SITE_OTHER): Payer: Medicare PPO | Admitting: Family Medicine

## 2014-06-20 ENCOUNTER — Encounter: Payer: Self-pay | Admitting: Family Medicine

## 2014-06-20 VITALS — BP 130/71 | HR 67 | Temp 97.0°F | Ht 71.0 in | Wt 299.6 lb

## 2014-06-20 DIAGNOSIS — E119 Type 2 diabetes mellitus without complications: Secondary | ICD-10-CM

## 2014-06-20 DIAGNOSIS — Z139 Encounter for screening, unspecified: Secondary | ICD-10-CM

## 2014-06-20 DIAGNOSIS — I10 Essential (primary) hypertension: Secondary | ICD-10-CM

## 2014-06-20 DIAGNOSIS — R5383 Other fatigue: Secondary | ICD-10-CM

## 2014-06-20 DIAGNOSIS — E785 Hyperlipidemia, unspecified: Secondary | ICD-10-CM

## 2014-06-20 LAB — POCT GLYCOSYLATED HEMOGLOBIN (HGB A1C): Hemoglobin A1C: 6

## 2014-06-20 LAB — POCT UA - MICROALBUMIN: Microalbumin Ur, POC: 20 mg/L

## 2014-06-20 MED ORDER — PRAVASTATIN SODIUM 20 MG PO TABS: 20.0000 mg | ORAL_TABLET | Freq: Every day | ORAL | Status: DC

## 2014-06-20 MED ORDER — PIOGLITAZONE HCL 30 MG PO TABS: ORAL_TABLET | ORAL | Status: DC

## 2014-06-20 MED ORDER — LOSARTAN POTASSIUM-HCTZ 100-12.5 MG PO TABS: ORAL_TABLET | ORAL | Status: DC

## 2014-06-20 NOTE — Progress Notes (Signed)
   Subjective:    Patient ID: Brett Jensen, male    DOB: 11-Nov-1945, 68 y.o.   MRN: [id number]  HPI Patient is here for routine follow up.  He has hx of diabetes, htn, and hyperlipidemia.  He c/o right shoulder arthralgias.    Review of Systems  Constitutional: Negative for fever.  HENT: Negative for ear pain.   Eyes: Negative for discharge.  Respiratory: Negative for cough.   Cardiovascular: Negative for chest pain.  Gastrointestinal: Negative for abdominal distention.  Endocrine: Negative for polyuria.  Genitourinary: Negative for difficulty urinating.  Musculoskeletal: Negative for gait problem and neck pain.  Skin: Negative for color change and rash.  Neurological: Negative for speech difficulty and headaches.  Psychiatric/Behavioral: Negative for agitation.       Objective:    BP 130/71 mmHg  Pulse 67  Temp(Src) 97 F (36.1 C) (Oral)  Ht $R'5\' 11"'vw$  (1.803 m)  Wt 299 lb 9.6 oz (135.898 kg)  BMI 41.80 kg/m2 Physical Exam  Constitutional: He is oriented to person, place, and time. He appears well-developed and well-nourished.  HENT:  Head: Normocephalic and atraumatic.  Mouth/Throat: Oropharynx is clear and moist.  Eyes: Pupils are equal, round, and reactive to light.  Neck: Normal range of motion. Neck supple.  Cardiovascular: Normal rate and regular rhythm.   No murmur heard. Pulmonary/Chest: Effort normal and breath sounds normal.  Abdominal: Soft. Bowel sounds are normal. There is no tenderness.  Neurological: He is alert and oriented to person, place, and time.  Skin: Skin is warm and dry.  Psychiatric: He has a normal mood and affect.   Procedure - one cc of kenalog and one cc 1 % lidocaine injected posterior left shoulder and tolerated well.       Assessment & Plan:     ICD-9-CM ICD-10-CM   1. Other fatigue 780.79 R53.83 CBC With differential/Platelet     PSA, total and free     Thyroid Panel With TSH  2. Hyperlipemia 272.4 E78.5   3. Essential  hypertension, benign 401.1 I10   4. Diabetes mellitus type 2, controlled, without complications 979.15 W41.3 POCT glycosylated hemoglobin (Hb A1C)     POCT UA - Microalbumin     Lipid panel     CMP14+EGFR  5. Screening V82.9 Z13.9 PSA, total and free  6. HLD (hyperlipidemia) 272.4 E78.5 pravastatin (PRAVACHOL) 20 MG tablet  Right shoulder arthralgia - Injection posterior shoulder    No Follow-up on file.  Lysbeth Penner FNP

## 2014-06-20 NOTE — Addendum Note (Signed)
Addended by: Prescott GumLAND, Mykaila Blunck M on: 06/20/2014 03:04 PM   Modules accepted: Orders

## 2014-06-21 LAB — CBC WITH DIFFERENTIAL
Basophils Absolute: 0 10*3/uL (ref 0.0–0.2)
Basos: 0 %
Eos: 4 %
Eosinophils Absolute: 0.3 10*3/uL (ref 0.0–0.4)
HCT: 40.3 % (ref 37.5–51.0)
Hemoglobin: 13.3 g/dL (ref 12.6–17.7)
Immature Grans (Abs): 0 10*3/uL (ref 0.0–0.1)
Immature Granulocytes: 0 %
Lymphocytes Absolute: 3.3 10*3/uL — ABNORMAL HIGH (ref 0.7–3.1)
Lymphs: 43 %
MCH: 29.2 pg (ref 26.6–33.0)
MCHC: 33 g/dL (ref 31.5–35.7)
MCV: 88 fL (ref 79–97)
Monocytes Absolute: 0.6 10*3/uL (ref 0.1–0.9)
Monocytes: 8 %
Neutrophils Absolute: 3.6 10*3/uL (ref 1.4–7.0)
Neutrophils Relative %: 45 %
Platelets: 286 10*3/uL (ref 150–379)
RBC: 4.56 x10E6/uL (ref 4.14–5.80)
RDW: 13.2 % (ref 12.3–15.4)
WBC: 7.7 10*3/uL (ref 3.4–10.8)

## 2014-06-21 LAB — CMP14+EGFR
ALT: 22 IU/L (ref 0–44)
AST: 29 IU/L (ref 0–40)
Albumin/Globulin Ratio: 1.5 (ref 1.1–2.5)
Albumin: 4.4 g/dL (ref 3.6–4.8)
Alkaline Phosphatase: 63 IU/L (ref 39–117)
BUN/Creatinine Ratio: 17 (ref 10–22)
BUN: 17 mg/dL (ref 8–27)
CO2: 25 mmol/L (ref 18–29)
Calcium: 9.5 mg/dL (ref 8.6–10.2)
Chloride: 101 mmol/L (ref 97–108)
Creatinine, Ser: 1.03 mg/dL (ref 0.76–1.27)
GFR calc Af Amer: 86 mL/min/{1.73_m2} (ref >59)
GFR calc non Af Amer: 74 mL/min/{1.73_m2} (ref >59)
Globulin, Total: 2.9 g/dL (ref 1.5–4.5)
Glucose: 122 mg/dL — ABNORMAL HIGH (ref 65–99)
Potassium: 4.3 mmol/L (ref 3.5–5.2)
Sodium: 140 mmol/L (ref 134–144)
Total Bilirubin: 0.8 mg/dL (ref 0.0–1.2)
Total Protein: 7.3 g/dL (ref 6.0–8.5)

## 2014-06-21 LAB — PSA, TOTAL AND FREE
PSA, Free Pct: 35.7 %
PSA, Free: 0.25 ng/mL
PSA: 0.7 ng/mL (ref 0.0–4.0)

## 2014-06-21 LAB — THYROID PANEL WITH TSH
Free Thyroxine Index: 2 (ref 1.2–4.9)
T3 Uptake Ratio: 29 % (ref 24–39)
T4, Total: 7 ug/dL (ref 4.5–12.0)
TSH: 2.68 u[IU]/mL (ref 0.450–4.500)

## 2014-06-21 LAB — LIPID PANEL
Chol/HDL Ratio: 4 ratio units (ref 0.0–5.0)
Cholesterol, Total: 179 mg/dL (ref 100–199)
HDL: 45 mg/dL (ref >39)
LDL Calculated: 109 mg/dL — ABNORMAL HIGH (ref 0–99)
Triglycerides: 123 mg/dL (ref 0–149)
VLDL Cholesterol Cal: 25 mg/dL (ref 5–40)

## 2014-06-21 LAB — MICROALBUMIN, URINE: Microalbumin, Urine: 24.4 ug/mL — ABNORMAL HIGH (ref 0.0–17.0)

## 2014-06-24 ENCOUNTER — Ambulatory Visit: Payer: Medicare PPO | Admitting: Family Medicine

## 2014-06-30 ENCOUNTER — Telehealth: Payer: Self-pay | Admitting: Family Medicine

## 2014-06-30 ENCOUNTER — Other Ambulatory Visit: Payer: Self-pay | Admitting: Family Medicine

## 2014-06-30 NOTE — Telephone Encounter (Signed)
-----   Message from Deatra CanterWilliam J Oxford, FNP sent at 06/30/2014  2:05 PM EST ----- Labs look good

## 2014-07-01 NOTE — Telephone Encounter (Signed)
-----   Message from William J Oxford, FNP sent at 06/30/2014  2:05 PM EST ----- °Labs look good °

## 2014-07-01 NOTE — Telephone Encounter (Signed)
Letter sent with results

## 2014-11-28 ENCOUNTER — Ambulatory Visit (INDEPENDENT_AMBULATORY_CARE_PROVIDER_SITE_OTHER): Payer: Medicare PPO

## 2014-11-28 ENCOUNTER — Ambulatory Visit (INDEPENDENT_AMBULATORY_CARE_PROVIDER_SITE_OTHER): Payer: Medicare PPO | Admitting: Family Medicine

## 2014-11-28 ENCOUNTER — Encounter: Payer: Self-pay | Admitting: Family Medicine

## 2014-11-28 VITALS — BP 139/82 | HR 73 | Temp 97.4°F | Ht 71.0 in | Wt 299.6 lb

## 2014-11-28 DIAGNOSIS — M25562 Pain in left knee: Secondary | ICD-10-CM

## 2014-11-28 MED ORDER — MELOXICAM 15 MG PO TABS: 15.0000 mg | ORAL_TABLET | Freq: Every day | ORAL | Status: DC

## 2014-11-28 NOTE — Progress Notes (Signed)
Subjective:  Patient ID: Brett Jensen, male    DOB: 10-16-1945  Age: 69 y.o. MRN: [id number]  CC: Knee Pain   HPI Brett Jensen presents for recurrent left knee pain. He has had injections in the past and will like to have that done again today. The knee is stiff and swollen and inhibiting ambulation. His pain in the right also but the left is more severe.  History Brett Jensen has a past medical history of Diabetes mellitus without complication; Hypertension; Restless leg syndrome; Erectile dysfunction; Shingles; and Hyperlipidemia.   He has past surgical history that includes Spine surgery and Joint replacement (Right).   His family history includes Cancer in his father and mother.He reports that he has been smoking Cigars.  He does not have any smokeless tobacco history on file. He reports that he does not drink alcohol or use illicit drugs.  Current Outpatient Prescriptions on File Prior to Visit  Medication Sig Dispense Refill  . aspirin EC 81 MG tablet Take 81 mg by mouth daily.    Marland Kitchen glucose blood (ACCU-CHEK AVIVA PLUS) test strip 1 each by Other route as needed for other. Use as instructed 50 each 5  . losartan-hydrochlorothiazide (HYZAAR) 100-12.5 MG per tablet TAKE ONE TABLET BY MOUTH ONE TIME DAILY 30 tablet 11  . pioglitazone (ACTOS) 30 MG tablet TAKE ONE TABLET BY MOUTH ONE TIME DAILY 30 tablet 11  . pravastatin (PRAVACHOL) 20 MG tablet Take 1 tablet (20 mg total) by mouth daily. 30 tablet 11   No current facility-administered medications on file prior to visit.    ROS Review of Systems  Constitutional: Negative for fever, chills, diaphoresis and unexpected weight change.  HENT: Negative for congestion, hearing loss, rhinorrhea, sore throat and trouble swallowing.   Gastrointestinal: Negative for nausea, vomiting, abdominal pain, diarrhea, constipation and abdominal distention.  Endocrine: Negative for cold intolerance and heat intolerance.  Genitourinary: Negative for  dysuria, hematuria and flank pain.  Musculoskeletal: Negative for joint swelling and arthralgias.  Skin: Negative for rash.  Neurological: Negative for dizziness and headaches.  Psychiatric/Behavioral: Negative for dysphoric mood, decreased concentration and agitation. The patient is not nervous/anxious.     Objective:  BP 139/82 mmHg  Pulse 73  Temp(Src) 97.4 F (36.3 C) (Oral)  Ht  (1.803 m)  Wt 299 lb 9.6 oz (135.898 kg)  BMI 41.80 kg/m2  BP Readings from Last 3 Encounters:  11/28/14 139/82  06/20/14 130/71  12/24/13 137/78    Wt Readings from Last 3 Encounters:  11/28/14 299 lb 9.6 oz (135.898 kg)  06/20/14 299 lb 9.6 oz (135.898 kg)  12/24/13 293 lb 9.6 oz (133.176 kg)     Physical Exam  Musculoskeletal: He exhibits tenderness (medial joint line left knee and slightly inferiorly).   Tender range of motion with diminished flexion past about 80  Lab Results  Component Value Date   HGBA1C 6.0 06/20/2014   HGBA1C 6.1 12/24/2013   HGBA1C 5.5 06/24/2013    Lab Results  Component Value Date   WBC 7.7 06/20/2014   HGB 13.3 06/20/2014   HCT 40.3 06/20/2014   PLT 286 06/20/2014   GLUCOSE 122* 06/20/2014   CHOL 179 06/20/2014   TRIG 123 06/20/2014   HDL 45 06/20/2014   LDLCALC 109* 06/20/2014   ALT 22 06/20/2014   AST 29 06/20/2014   NA 140 06/20/2014   K 4.3 06/20/2014   CL 101 06/20/2014   CREATININE 1.03 06/20/2014   BUN 17 06/20/2014  CO2 25 06/20/2014   TSH 2.680 06/20/2014   PSA 0.7 06/20/2014   HGBA1C 6.0 06/20/2014   MICROALBUR 1.88 02/14/2013    Dg Chest 2 View  10/25/2007   Clinical Data: PREOPERATIVE RESPIRATORY EXAM. CIRCUMCISION.   CHEST - 2 VIEW   Comparison: None   Findings: Mild cardiomegaly is noted. Mild peribronchial thickening is present without focal airspace disease, pleural effusions, or pneumothorax. Mild elevation of the right hemidiaphragm is present.   Thoracic spondylosis is present. The visualized upper abdomen is  unremarkable.   IMPRESSION: Mild peribronchial thickening without focal airspace disease.   Mild cardiomegaly.  Provider: Ronalee Beltsobin Jensen   Assessment & Plan:   Brett FearingJames was seen today for knee pain.  Diagnoses and all orders for this visit:  Left knee pain Orders: -     DG Knee 1-2 Views Left  Other orders -     meloxicam (MOBIC) 15 MG tablet; Take 1 tablet (15 mg total) by mouth daily. For muscle and joint pain  A steroid injection was performed at medial joint line left knee. Local with 2% lidocaine at the injection site. With sterile technique the joint space was infiltrated using 1% plain Lidocaine and 9 mg of Celestone.This was well tolerated. I am having Brett Jensen start on meloxicam. I am also having him maintain his aspirin EC, glucose blood, losartan-hydrochlorothiazide, pioglitazone, and pravastatin.  Meds ordered this encounter  Medications  . meloxicam (MOBIC) 15 MG tablet    Sig: Take 1 tablet (15 mg total) by mouth daily. For muscle and joint pain    Dispense:  30 tablet    Refill:  2   Wound care reviewed. Diagnostic knee x-ray performed showing mild to moderate degenerative joint disease.  Follow-up: Return in about 2 weeks (around 12/12/2014), or if symptoms worsen or fail to improve.  Mechele ClaudeWarren Kimbella Jensen, M.D.

## 2014-12-23 ENCOUNTER — Ambulatory Visit (INDEPENDENT_AMBULATORY_CARE_PROVIDER_SITE_OTHER): Payer: Medicare PPO | Admitting: Family Medicine

## 2014-12-23 ENCOUNTER — Encounter: Payer: Self-pay | Admitting: Family Medicine

## 2014-12-23 VITALS — BP 128/66 | HR 61 | Temp 97.1°F | Ht 71.0 in | Wt 296.0 lb

## 2014-12-23 DIAGNOSIS — E119 Type 2 diabetes mellitus without complications: Secondary | ICD-10-CM

## 2014-12-23 DIAGNOSIS — M1712 Unilateral primary osteoarthritis, left knee: Secondary | ICD-10-CM | POA: Diagnosis not present

## 2014-12-23 DIAGNOSIS — E785 Hyperlipidemia, unspecified: Secondary | ICD-10-CM | POA: Diagnosis not present

## 2014-12-23 DIAGNOSIS — I10 Essential (primary) hypertension: Secondary | ICD-10-CM

## 2014-12-23 DIAGNOSIS — M1990 Unspecified osteoarthritis, unspecified site: Secondary | ICD-10-CM

## 2014-12-23 LAB — POCT GLYCOSYLATED HEMOGLOBIN (HGB A1C): Hemoglobin A1C: 6

## 2014-12-23 MED ORDER — LOSARTAN POTASSIUM-HCTZ 100-12.5 MG PO TABS: ORAL_TABLET | ORAL | Status: DC

## 2014-12-23 MED ORDER — PRAVASTATIN SODIUM 20 MG PO TABS: 20.0000 mg | ORAL_TABLET | Freq: Every day | ORAL | Status: DC

## 2014-12-23 MED ORDER — PIOGLITAZONE HCL 30 MG PO TABS: ORAL_TABLET | ORAL | Status: DC

## 2014-12-23 MED ORDER — CLOBETASOL PROPIONATE 0.05 % EX CREA: 1.0000 "application " | TOPICAL_CREAM | Freq: Two times a day (BID) | CUTANEOUS | Status: DC

## 2014-12-23 NOTE — Addendum Note (Signed)
Addended by: Gwenith DailyHUDY, KRISTEN N on: 12/23/2014 09:17 AM   Modules accepted: Orders

## 2014-12-23 NOTE — Progress Notes (Signed)
   Subjective:    Patient ID: Brett Jensen, male    DOB: 10/29/1945, 69 y.o.   MRN: [id number]  HPI  69 year old gentleman here to follow-up diabetes, hyperlipidemia, and hypertension. He is compliant with his medicines. He checks blood sugars at home and generally they are around 100 fasting. His only medi his only complaint today is left knee pain. He had been taking Mobic but saw no relief and I think it would probably be better given his diabetes to stay away from NSAIDs. I have discussed this with him cation is Actos which is a little bit unusual but as long as it's working I'm not inclined to modify treatment.  Patient Active Problem List   Diagnosis Date Noted  . Arthritis   . Hyperlipidemia   . Obesity, unspecified 02/14/2013  . Erectile dysfunction 02/14/2013  . HTN (hypertension) 11/09/2010  . DM (diabetes mellitus) 11/09/2010   Outpatient Encounter Prescriptions as of 12/23/2014  Medication Sig  . aspirin EC 81 MG tablet Take 81 mg by mouth daily.  Marland Kitchen. glucose blood (ACCU-CHEK AVIVA PLUS) test strip 1 each by Other route as needed for other. Use as instructed  . losartan-hydrochlorothiazide (HYZAAR) 100-12.5 MG per tablet TAKE ONE TABLET BY MOUTH ONE TIME DAILY  . meloxicam (MOBIC) 15 MG tablet Take 1 tablet (15 mg total) by mouth daily. For muscle and joint pain (Patient not taking: Reported on 12/23/2014)  . pioglitazone (ACTOS) 30 MG tablet TAKE ONE TABLET BY MOUTH ONE TIME DAILY  . pravastatin (PRAVACHOL) 20 MG tablet Take 1 tablet (20 mg total) by mouth daily.   No facility-administered encounter medications on file as of 12/23/2014.       Review of Systems  Constitutional: Negative.   HENT: Negative.   Respiratory: Negative.   Cardiovascular: Negative.   Gastrointestinal: Negative.   Musculoskeletal: Positive for arthralgias.       Objective:   Physical Exam  Constitutional: He is oriented to person, place, and time. He appears well-developed and well-nourished.    Cardiovascular: Normal rate, regular rhythm and normal heart sounds.   Pulmonary/Chest: Effort normal and breath sounds normal.  Musculoskeletal:  Left knee palpated prepped and injected using lateral approach with 1 mL Depo-Medrol and 1 mL Marcaine. Patient tolerated this well.  Neurological: He is alert and oriented to person, place, and time.  Psychiatric: He has a normal mood and affect.   BP 128/66 mmHg  Pulse 61  Temp(Src) 97.1 F (36.2 C) (Oral)  Ht 5\' 11"  (1.803 m)  Wt 296 lb (134.265 kg)  BMI 41.30 kg/m2        Assessment & Plan:  1. Hyperlipidemia Lipids were last checked 6 months ago. There are no problems with medicine complications. Plan to recheck in 6 more months  2. Essential hypertension Blood pressure is well controlled on losartan hydrochlorothiazide  3. Type 2 diabetes mellitus without complication Suspect A1c will be good based on his home monitoring. Continue with Actos - POCT glycosylated hemoglobin (Hb A1C)  4. Arthritis This is degenerative arthritis involving the. As described above knee was injected with Depo-Medrol. Cautioned to stay away from NSAIDs.  Frederica KusterStephen M Cerise Lieber MD

## 2015-01-26 ENCOUNTER — Other Ambulatory Visit: Payer: Self-pay

## 2015-02-12 ENCOUNTER — Encounter: Payer: Self-pay | Admitting: Family Medicine

## 2015-02-12 ENCOUNTER — Other Ambulatory Visit: Payer: Self-pay | Admitting: *Deleted

## 2015-02-12 ENCOUNTER — Ambulatory Visit (INDEPENDENT_AMBULATORY_CARE_PROVIDER_SITE_OTHER): Payer: Medicare PPO | Admitting: Family Medicine

## 2015-02-12 VITALS — BP 125/69 | HR 70 | Temp 97.6°F | Ht 71.0 in | Wt 300.0 lb

## 2015-02-12 DIAGNOSIS — M1990 Unspecified osteoarthritis, unspecified site: Secondary | ICD-10-CM

## 2015-02-12 MED ORDER — LOSARTAN POTASSIUM-HCTZ 100-12.5 MG PO TABS: ORAL_TABLET | ORAL | Status: DC

## 2015-02-12 MED ORDER — TRAMADOL HCL 50 MG PO TABS: 50.0000 mg | ORAL_TABLET | Freq: Three times a day (TID) | ORAL | Status: DC | PRN

## 2015-02-12 NOTE — Progress Notes (Signed)
   Subjective:    Patient ID: Brett CrankerJames D Jensen, male    DOB: 09/19/1945, 69 y.o.   MRN: [id number]  HPI 69 year old gentleman with knee pain. He got some relief from injection that he had in late April. Now the right knee is starting to click. Review of his x-ray showed mild tricompartment syndrome in both knee joints. He has questions about injections of a "gel".  He would like to get back to walking but I explained that this is probably not a good idea given his knee pain and symptoms that suggested alternative exercise such as a stationary bike or swimming  Patient Active Problem List   Diagnosis Date Noted  . Arthritis   . Hyperlipidemia   . Obesity, unspecified 02/14/2013  . Erectile dysfunction 02/14/2013  . HTN (hypertension) 11/09/2010  . DM (diabetes mellitus) 11/09/2010   Outpatient Encounter Prescriptions as of 02/12/2015  Medication Sig  . aspirin EC 81 MG tablet Take 81 mg by mouth daily.  . clobetasol cream (TEMOVATE) 0.05 % Apply 1 application topically 2 (two) times daily.  Marland Kitchen. glucose blood (ACCU-CHEK AVIVA PLUS) test strip 1 each by Other route as needed for other. Use as instructed  . losartan-hydrochlorothiazide (HYZAAR) 100-12.5 MG per tablet TAKE ONE TABLET BY MOUTH ONE TIME DAILY  . meloxicam (MOBIC) 15 MG tablet Take 1 tablet (15 mg total) by mouth daily. For muscle and joint pain  . pioglitazone (ACTOS) 30 MG tablet TAKE ONE TABLET BY MOUTH ONE TIME DAILY  . pravastatin (PRAVACHOL) 20 MG tablet Take 1 tablet (20 mg total) by mouth daily.   No facility-administered encounter medications on file as of 02/12/2015.      Review of Systems  HENT: Negative.   Respiratory: Negative.   Cardiovascular: Negative.   Musculoskeletal: Positive for arthralgias.  Psychiatric/Behavioral: Negative.        Objective:   Physical Exam  Constitutional: He appears well-developed and well-nourished.  Musculoskeletal:  Both knees were examined. There is no instability. There is  minimal if any effusion. I can detect some mild crepitance on flexion and extension of the right knee, but none on the left.    BP 125/69 mmHg  Pulse 70  Temp(Src) 97.6 F (36.4 C) (Oral)  Ht 5\' 11"  (1.803 m)  Wt 300 lb (136.079 kg)  BMI 41.86 kg/m2       Assessment & Plan:  1. Arthritis Patient has mild degenerative changes in both knees. We will continue steroidal injections had not less than 4 month intervals as long as it is helpful area also gave him a prescription today for tramadol to take at bedtime or as needed for pain. As noted above would not recommend excessive walking but rather stationary bike or walking in water as alternative  Frederica KusterStephen M Miller MD

## 2015-03-17 ENCOUNTER — Encounter: Payer: Self-pay | Admitting: *Deleted

## 2015-05-06 ENCOUNTER — Other Ambulatory Visit: Payer: Self-pay

## 2015-05-06 MED ORDER — GLUCOSE BLOOD VI STRP: ORAL_STRIP | Status: DC

## 2015-05-11 ENCOUNTER — Emergency Department (HOSPITAL_COMMUNITY)
Admission: EM | Admit: 2015-05-11 | Discharge: 2015-05-11 | Disposition: A | Discharge status: Home / Self Care | Payer: Medicare PPO | Attending: Emergency Medicine | Admitting: Emergency Medicine

## 2015-05-11 ENCOUNTER — Encounter (HOSPITAL_COMMUNITY): Payer: Self-pay | Admitting: *Deleted

## 2015-05-11 DIAGNOSIS — M1990 Unspecified osteoarthritis, unspecified site: Secondary | ICD-10-CM | POA: Diagnosis not present

## 2015-05-11 DIAGNOSIS — Z7982 Long term (current) use of aspirin: Secondary | ICD-10-CM | POA: Diagnosis not present

## 2015-05-11 DIAGNOSIS — E785 Hyperlipidemia, unspecified: Secondary | ICD-10-CM | POA: Insufficient documentation

## 2015-05-11 DIAGNOSIS — S39012A Strain of muscle, fascia and tendon of lower back, initial encounter: Secondary | ICD-10-CM | POA: Diagnosis not present

## 2015-05-11 DIAGNOSIS — X58XXXA Exposure to other specified factors, initial encounter: Secondary | ICD-10-CM | POA: Diagnosis not present

## 2015-05-11 DIAGNOSIS — Y9289 Other specified places as the place of occurrence of the external cause: Secondary | ICD-10-CM | POA: Insufficient documentation

## 2015-05-11 DIAGNOSIS — I10 Essential (primary) hypertension: Secondary | ICD-10-CM | POA: Diagnosis not present

## 2015-05-11 DIAGNOSIS — Z8619 Personal history of other infectious and parasitic diseases: Secondary | ICD-10-CM | POA: Diagnosis not present

## 2015-05-11 DIAGNOSIS — Y998 Other external cause status: Secondary | ICD-10-CM | POA: Diagnosis not present

## 2015-05-11 DIAGNOSIS — Z720 Tobacco use: Secondary | ICD-10-CM | POA: Diagnosis not present

## 2015-05-11 DIAGNOSIS — E119 Type 2 diabetes mellitus without complications: Secondary | ICD-10-CM | POA: Insufficient documentation

## 2015-05-11 DIAGNOSIS — S79911A Unspecified injury of right hip, initial encounter: Secondary | ICD-10-CM | POA: Diagnosis present

## 2015-05-11 DIAGNOSIS — Z79899 Other long term (current) drug therapy: Secondary | ICD-10-CM | POA: Insufficient documentation

## 2015-05-11 DIAGNOSIS — Y9389 Activity, other specified: Secondary | ICD-10-CM | POA: Diagnosis not present

## 2015-05-11 MED ORDER — ONDANSETRON 4 MG PO TBDP
8.0000 mg | ORAL_TABLET | Freq: Once | ORAL | Status: AC
  Administered 2015-05-11: 8 mg via ORAL
  Filled 2015-05-11: qty 2

## 2015-05-11 MED ORDER — HYDROCODONE-ACETAMINOPHEN 5-325 MG PO TABS: 1.0000 | ORAL_TABLET | Freq: Four times a day (QID) | ORAL | Status: DC | PRN

## 2015-05-11 MED ORDER — ONDANSETRON HCL 4 MG PO TABS: 4.0000 mg | ORAL_TABLET | Freq: Four times a day (QID) | ORAL | Status: DC

## 2015-05-11 MED ORDER — HYDROCODONE-ACETAMINOPHEN 5-325 MG PO TABS
2.0000 | ORAL_TABLET | Freq: Once | ORAL | Status: AC
  Administered 2015-05-11: 2 via ORAL
  Filled 2015-05-11: qty 2

## 2015-05-11 NOTE — Discharge Instructions (Signed)

## 2015-05-11 NOTE — ED Provider Notes (Signed)
CSN: [id number]     Arrival date & time 05/11/15  1552 History  By signing my name below, I, Brett Jensen, attest that this documentation has been prepared under the direction and in the presence of Brett Horseman, PA-C Electronically Signed: Soijett Jensen, ED Scribe. 05/11/2015. 5:07 PM.   Chief Complaint  Patient presents with  . Hip Pain     The history is provided by the patient. No language interpreter was used.    Brett Jensen is a 69 y.o. male with a medical hx of DM, HTN, and arthritis in bilateral knees, who presents to the Emergency Department complaining of sudden right hip pain onset today PTA. He reports that he ws doing yard work when he thinks he turned the wrong way and that is when his pain began. He notes that he had similar symptoms 4 years ago when he ruptured a disc in his back that he had surgery on. He reports that the right hip pain radiates down his right leg while ambulating. He notes that he has tried aleve at 2 PM with no relief of his symptoms. He denies color change, wound, rash, difficulty urinating, constipation, swelling, gait problem, and any other symptoms. Pt PCP is Brett Jensen.   Past Medical History  Diagnosis Date  . Diabetes mellitus without complication (HCC)   . Hypertension   . Restless leg syndrome   . Erectile dysfunction   . Shingles   . Hyperlipidemia   . Arthritis     bilateral knees   Past Surgical History  Procedure Laterality Date  . Spine surgery      lower back  . Joint replacement Right     ankle   Family History  Problem Relation Age of Onset  . Cancer Mother   . Cancer Father    Social History  Substance Use Topics  . Smoking status: Current Every Day Smoker    Types: Cigars  . Smokeless tobacco: Never Used  . Alcohol Use: No    Review of Systems  Gastrointestinal: Negative for constipation.  Genitourinary: Negative for difficulty urinating.  Musculoskeletal: Positive for arthralgias (right hip). Negative for  joint swelling and gait problem.  Skin: Negative for color change, rash and wound.      Allergies  Review of patient's allergies indicates no known allergies.  Home Medications   Prior to Admission medications   Medication Sig Start Date End Date Taking? Authorizing Provider  aspirin EC 81 MG tablet Take 81 mg by mouth daily.    Historical Provider, MD  clobetasol cream (TEMOVATE) 0.05 % Apply 1 application topically 2 (two) times daily. 12/23/14   Brett Kuster, MD  glucose blood (ACCU-CHEK AVIVA PLUS) test strip Test once daily 05/06/15   Brett Penna, MD  losartan-hydrochlorothiazide [facility]) 100-12.5 MG per tablet TAKE ONE TABLET BY MOUTH ONE TIME DAILY 02/12/15   Brett Kuster, MD  meloxicam (MOBIC) 15 MG tablet Take 1 tablet (15 mg total) by mouth daily. For muscle and joint pain 11/28/14   Brett Claude, MD  pioglitazone (ACTOS) 30 MG tablet TAKE ONE TABLET BY MOUTH ONE TIME DAILY 12/23/14   Brett Kuster, MD  pravastatin (PRAVACHOL) 20 MG tablet Take 1 tablet (20 mg total) by mouth daily. 12/23/14   Brett Kuster, MD  traMADol (ULTRAM) 50 MG tablet Take 1 tablet (50 mg total) by mouth every 8 (eight) hours as needed. 02/12/15   Brett Kuster, MD   BP 127/58 mmHg  Pulse 67  Temp(Src) 97.8 F (36.6 C) (Oral)  Resp 18  Ht 6' (1.829 m)  Wt 300 lb (136.079 kg)  BMI 40.68 kg/m2  SpO2 97% Physical Exam  Constitutional: He is oriented to person, place, and time. He appears well-developed and well-nourished. No distress.  HENT:  Head: Normocephalic and atraumatic.  Eyes: Conjunctivae and EOM are normal. Right eye exhibits no discharge. Left eye exhibits no discharge. No scleral icterus.  Neck: Normal range of motion. Neck supple. No tracheal deviation present.  Cardiovascular: Normal rate, regular rhythm and normal heart sounds.  Exam reveals no gallop and no friction rub.   No murmur heard. Pulmonary/Chest: Effort normal and breath sounds normal. No respiratory  distress. He has no wheezes.  Abdominal: Soft. He exhibits no distension. There is no tenderness.  Musculoskeletal: Normal range of motion.  Right lumbar paraspinal muscles tender to palpation, no bony tenderness, step-offs, or gross abnormality or deformity of spine, patient is able to ambulate, moves all extremities  Bilateral great toe extension intact Bilateral plantar/dorsiflexion intact  Neurological: He is alert and oriented to person, place, and time.  Sensation and strength intact bilaterally   Skin: Skin is warm and dry. He is not diaphoretic.  Psychiatric: He has a normal mood and affect. His behavior is normal. Judgment and thought content normal.  Nursing note and vitals reviewed.   ED Course  Procedures (including critical care time) DIAGNOSTIC STUDIES: Oxygen Saturation is 97% on RA, nl by my interpretation.    COORDINATION OF CARE: 5:04 PM Discussed treatment plan with pt at bedside which includes zofran, vicodin, vicodin Rx, and pt agreed to plan.      MDM   Final diagnoses:  Lumbar strain, initial encounter    Patient with back pain.  No neurological deficits and normal neuro exam.  Patient is ambulatory.  No loss of bowel or bladder control.  Doubt cauda equina.  Denies fever,  doubt epidural abscess or other lesion. Recommend back exercises, stretching, RICE, and will treat with a short course of norco.  Encouraged the patient that there could be a need for additional workup and/or imaging such as MRI, if the symptoms do not resolve. Patient advised that if the back pain does not resolve, or radiates, this could progress to more serious conditions and is encouraged to follow-up with PCP or orthopedics within 2 weeks.    5:34 PM Nurse notifies me that patient has a question about getting an x-ray.  When I went to answer the patient's question, he had left.   I, Brett Jensen, personally performed the services described in this documentation. All medical  record entries made by the scribe were at my direction and in my presence.  I have reviewed the chart and discharge instructions and agree that the record reflects my personal performance and is accurate and complete. Tyashia Morrisette.  05/11/2015. 5:24 PM.       Brett Horseman, PA-C 05/11/15 1735  Gerhard Munch, MD 05/11/15 [phone]

## 2015-05-11 NOTE — ED Notes (Signed)
Pt reports working outside, sudden onset of right hip pain that radiates down his leg and pain when ambulating.

## 2015-06-30 ENCOUNTER — Ambulatory Visit (INDEPENDENT_AMBULATORY_CARE_PROVIDER_SITE_OTHER): Payer: Medicare PPO | Admitting: Family Medicine

## 2015-06-30 ENCOUNTER — Encounter: Payer: Self-pay | Admitting: Family Medicine

## 2015-06-30 ENCOUNTER — Telehealth: Payer: Self-pay | Admitting: *Deleted

## 2015-06-30 VITALS — BP 129/74 | HR 77 | Temp 97.3°F | Ht 72.0 in | Wt 297.2 lb

## 2015-06-30 DIAGNOSIS — E785 Hyperlipidemia, unspecified: Secondary | ICD-10-CM

## 2015-06-30 DIAGNOSIS — E119 Type 2 diabetes mellitus without complications: Secondary | ICD-10-CM

## 2015-06-30 DIAGNOSIS — I10 Essential (primary) hypertension: Secondary | ICD-10-CM

## 2015-06-30 LAB — POCT GLYCOSYLATED HEMOGLOBIN (HGB A1C): HEMOGLOBIN A1C: 6.3

## 2015-06-30 MED ORDER — ATORVASTATIN CALCIUM 20 MG PO TABS: 20.0000 mg | ORAL_TABLET | Freq: Every day | ORAL | Status: DC

## 2015-06-30 NOTE — Telephone Encounter (Signed)
Needs diabetic eye exam this year. If he has had an eye exam we need to get the report so that his chart can be updated.

## 2015-06-30 NOTE — Progress Notes (Signed)
   Subjective:    Patient ID: Brett Jensen, male    DOB: 01/12/1946, 69 y.o.   MRN: [id number]  HPI 69 year old gentleman here to follow-up diabetes, hypertension, and hyperlipidemia. Generally, he feels well. He takes Actos as his only medicine for diabetes and last A1c was 6.0. Lipids are not quite at goal. He would like to try different statin to see if that might help his myalgias but I think what he is describing as myalgias is more arthritic pain in his knees and hips.  Patient Active Problem List   Diagnosis Date Noted  . Arthritis   . Hyperlipidemia   . Obesity, unspecified 02/14/2013  . Erectile dysfunction 02/14/2013  . HTN (hypertension) 11/09/2010  . DM (diabetes mellitus) ([place]) 11/09/2010   Outpatient Encounter Prescriptions as of 06/30/2015  Medication Sig  . aspirin EC 81 MG tablet Take 81 mg by mouth daily.  . clobetasol cream (TEMOVATE) 0.07 % Apply 1 application topically 2 (two) times daily.  Marland Kitchen glucose blood (ACCU-CHEK AVIVA PLUS) test strip Test once daily  . losartan-hydrochlorothiazide (HYZAAR) 100-12.5 MG per tablet TAKE ONE TABLET BY MOUTH ONE TIME DAILY  . ondansetron (ZOFRAN) 4 MG tablet Take 1 tablet (4 mg total) by mouth every 6 (six) hours.  . pioglitazone (ACTOS) 30 MG tablet TAKE ONE TABLET BY MOUTH ONE TIME DAILY  . pravastatin (PRAVACHOL) 20 MG tablet Take 1 tablet (20 mg total) by mouth daily.  Marland Kitchen HYDROcodone-acetaminophen (NORCO/VICODIN) 5-325 MG tablet Take 1-2 tablets by mouth every 6 (six) hours as needed. (Patient not taking: Reported on 06/30/2015)  . meloxicam (MOBIC) 15 MG tablet Take 1 tablet (15 mg total) by mouth daily. For muscle and joint pain (Patient not taking: Reported on 06/30/2015)  . traMADol (ULTRAM) 50 MG tablet Take 1 tablet (50 mg total) by mouth every 8 (eight) hours as needed. (Patient not taking: Reported on 06/30/2015)   No facility-administered encounter medications on file as of 06/30/2015.    129  Review of Systems    Respiratory: Negative.   Cardiovascular: Negative.   Genitourinary: Negative.   Musculoskeletal: Positive for arthralgias.  Neurological: Negative.   Psychiatric/Behavioral: Negative.        Objective:   Physical Exam  Constitutional: He is oriented to person, place, and time. He appears well-developed and well-nourished.  Cardiovascular: Normal rate and regular rhythm.   Pulmonary/Chest: Effort normal and breath sounds normal.  Musculoskeletal:  Knees injected with Depo-Medrol and Marcaine after usual prep of the skin with alcohol. Injections went in easily and were well tolerated.  Neurological: He is alert and oriented to person, place, and time.  Psychiatric: He has a normal mood and affect.          Assessment & Plan:  1. Hyperlipidemia *It has been 1 year since lipids were checked. We will do that today** - CMP14+EGFR - Lipid panel - Microalbumin / creatinine urine ratio - POCT glycosylated hemoglobin (Hb A1C)  2. Essential hypertension Blood pressure is well controlled on Hyzaar - CMP14+EGFR - Lipid panel - Microalbumin / creatinine urine ratio - POCT glycosylated hemoglobin (Hb A1C)  3. Type 2 diabetes mellitus without complication, without long-term current use of insulin (HCC) Diabetes seems to be mild with only medication being Actos. Will check A1c and microalbumin - CMP14+EGFR - Lipid panel - Microalbumin / creatinine urine ratio - POCT glycosylated hemoglobin (Hb A1C)  Wardell Honour MD

## 2015-07-01 LAB — LIPID PANEL
CHOLESTEROL TOTAL: 178 mg/dL (ref 100–199)
Chol/HDL Ratio: 3.9 ratio units (ref 0.0–5.0)
HDL: 46 mg/dL (ref >39)
LDL Calculated: 111 mg/dL — ABNORMAL HIGH (ref 0–99)
TRIGLYCERIDES: 105 mg/dL (ref 0–149)
VLDL CHOLESTEROL CAL: 21 mg/dL (ref 5–40)

## 2015-07-01 LAB — CMP14+EGFR
A/G RATIO: 1.5 (ref 1.1–2.5)
ALK PHOS: 69 IU/L (ref 39–117)
ALT: 22 IU/L (ref 0–44)
AST: 24 IU/L (ref 0–40)
Albumin: 4.1 g/dL (ref 3.6–4.8)
BILIRUBIN TOTAL: 0.9 mg/dL (ref 0.0–1.2)
BUN/Creatinine Ratio: 20 (ref 10–22)
BUN: 21 mg/dL (ref 8–27)
CO2: 24 mmol/L (ref 18–29)
Calcium: 9.3 mg/dL (ref 8.6–10.2)
Chloride: 97 mmol/L (ref 97–106)
Creatinine, Ser: 1.06 mg/dL (ref 0.76–1.27)
GFR calc Af Amer: 82 mL/min/{1.73_m2} (ref >59)
GFR calc non Af Amer: 71 mL/min/{1.73_m2} (ref >59)
GLUCOSE: 119 mg/dL — AB (ref 65–99)
Globulin, Total: 2.8 g/dL (ref 1.5–4.5)
POTASSIUM: 4.5 mmol/L (ref 3.5–5.2)
Sodium: 135 mmol/L — ABNORMAL LOW (ref 136–144)
TOTAL PROTEIN: 6.9 g/dL (ref 6.0–8.5)

## 2015-07-01 LAB — MICROALBUMIN / CREATININE URINE RATIO
CREATININE, UR: 96.7 mg/dL
MICROALB/CREAT RATIO: 33 mg/g creat — ABNORMAL HIGH (ref 0.0–30.0)
Microalbumin, Urine: 31.9 ug/mL

## 2015-07-02 ENCOUNTER — Ambulatory Visit: Payer: Medicare PPO | Admitting: Family Medicine

## 2015-07-02 DIAGNOSIS — E119 Type 2 diabetes mellitus without complications: Secondary | ICD-10-CM | POA: Diagnosis not present

## 2015-07-02 DIAGNOSIS — H2513 Age-related nuclear cataract, bilateral: Secondary | ICD-10-CM | POA: Diagnosis not present

## 2015-09-09 ENCOUNTER — Other Ambulatory Visit: Payer: Self-pay | Admitting: *Deleted

## 2015-09-09 ENCOUNTER — Other Ambulatory Visit: Payer: Self-pay

## 2015-09-09 MED ORDER — LOSARTAN POTASSIUM-HCTZ 100-12.5 MG PO TABS: ORAL_TABLET | ORAL | Status: DC

## 2015-09-09 MED ORDER — GLUCOSE BLOOD VI STRP: ORAL_STRIP | Status: DC

## 2015-09-09 MED ORDER — PIOGLITAZONE HCL 30 MG PO TABS: ORAL_TABLET | ORAL | Status: DC

## 2015-09-15 ENCOUNTER — Telehealth: Payer: Self-pay | Admitting: Family Medicine

## 2015-09-15 MED ORDER — TRIAMCINOLONE ACETONIDE 0.1 % EX CREA: 1.0000 "application " | TOPICAL_CREAM | Freq: Two times a day (BID) | CUTANEOUS | Status: DC

## 2015-09-15 NOTE — Telephone Encounter (Signed)
I suppose we could use this medicine but I'm not familiar with its use for restless legs. i do not  that it will cause any harm.

## 2015-10-06 ENCOUNTER — Other Ambulatory Visit: Payer: Self-pay | Admitting: Family Medicine

## 2015-10-15 ENCOUNTER — Other Ambulatory Visit: Payer: Self-pay | Admitting: *Deleted

## 2015-10-15 MED ORDER — PIOGLITAZONE HCL 30 MG PO TABS: 30.0000 mg | ORAL_TABLET | Freq: Every day | ORAL | Status: DC

## 2015-10-20 ENCOUNTER — Other Ambulatory Visit: Payer: Self-pay | Admitting: *Deleted

## 2015-10-20 MED ORDER — PIOGLITAZONE HCL 30 MG PO TABS: 30.0000 mg | ORAL_TABLET | Freq: Every day | ORAL | Status: DC

## 2015-11-25 ENCOUNTER — Other Ambulatory Visit: Payer: Self-pay | Admitting: *Deleted

## 2015-12-29 ENCOUNTER — Encounter: Payer: Self-pay | Admitting: Family Medicine

## 2015-12-29 ENCOUNTER — Ambulatory Visit (INDEPENDENT_AMBULATORY_CARE_PROVIDER_SITE_OTHER): Payer: Medicare Other | Admitting: Family Medicine

## 2015-12-29 VITALS — BP 119/67 | HR 61 | Temp 97.1°F | Ht 72.0 in | Wt 302.0 lb

## 2015-12-29 DIAGNOSIS — E119 Type 2 diabetes mellitus without complications: Secondary | ICD-10-CM | POA: Diagnosis not present

## 2015-12-29 DIAGNOSIS — I10 Essential (primary) hypertension: Secondary | ICD-10-CM | POA: Diagnosis not present

## 2015-12-29 DIAGNOSIS — E785 Hyperlipidemia, unspecified: Secondary | ICD-10-CM | POA: Diagnosis not present

## 2015-12-29 LAB — BAYER DCA HB A1C WAIVED: HB A1C: 6.4 % (ref <7.0)

## 2015-12-29 NOTE — Progress Notes (Signed)
Subjective:    Patient ID: Brett Jensen, male    DOB: 06/13/1946, 70 y.o.   MRN: [id number]  HPI Pt here for follow up and management of chronic medical problems which includes diabetes, hypertension and hyperlipidemia. He is taking medications regularly. His only concern is some swelling in his left foot. This occurs almost daily. He denies any problem with varicose veins or other venous issues. Checking sugars at home usually less than 120. Last A1c 6 months ago was 6.3. He takes only Actos for his diabetes. He denies any burning tingling or numbness in his feet     Patient Active Problem List   Diagnosis Date Noted  . Arthritis   . Hyperlipidemia   . Obesity, unspecified 02/14/2013  . Erectile dysfunction 02/14/2013  . HTN (hypertension) 11/09/2010  . DM (diabetes mellitus) (HCC) 11/09/2010   Outpatient Encounter Prescriptions as of 12/29/2015  Medication Sig  . aspirin EC 81 MG tablet Take 81 mg by mouth daily.  Marland Kitchen. atorvastatin (LIPITOR) 20 MG tablet Take 1 tablet (20 mg total) by mouth daily.  . clobetasol cream (TEMOVATE) 0.05 % Apply 1 application topically 2 (two) times daily.  Marland Kitchen. glucose blood (ACCU-CHEK AVIVA PLUS) test strip Test once daily  . losartan-hydrochlorothiazide (HYZAAR) 100-12.5 MG tablet TAKE ONE TABLET BY MOUTH ONE TIME DAILY  . pioglitazone (ACTOS) 30 MG tablet Take 1 tablet (30 mg total) by mouth daily.  Marland Kitchen. triamcinolone cream (KENALOG) 0.1 % Apply 1 application topically 2 (two) times daily.  . [DISCONTINUED] HYDROcodone-acetaminophen (NORCO/VICODIN) 5-325 MG tablet Take 1-2 tablets by mouth every 6 (six) hours as needed. (Patient not taking: Reported on 06/30/2015)  . [DISCONTINUED] meloxicam (MOBIC) 15 MG tablet Take 1 tablet (15 mg total) by mouth daily. For muscle and joint pain (Patient not taking: Reported on 06/30/2015)  . [DISCONTINUED] ondansetron (ZOFRAN) 4 MG tablet Take 1 tablet (4 mg total) by mouth every 6 (six) hours.  . [DISCONTINUED]  traMADol (ULTRAM) 50 MG tablet Take 1 tablet (50 mg total) by mouth every 8 (eight) hours as needed. (Patient not taking: Reported on 06/30/2015)   No facility-administered encounter medications on file as of 12/29/2015.      Review of Systems  Constitutional: Negative.   HENT: Negative.   Eyes: Negative.   Respiratory: Negative.   Cardiovascular: Positive for leg swelling (left foot in worse than right).  Gastrointestinal: Negative.   Endocrine: Negative.   Genitourinary: Negative.   Musculoskeletal: Negative.   Skin: Negative.   Allergic/Immunologic: Negative.   Neurological: Negative.   Hematological: Negative.   Psychiatric/Behavioral: Negative.        Objective:   Physical Exam  Constitutional: He is oriented to person, place, and time. He appears well-developed and well-nourished.  Eyes:  Cataract, right eye  Cardiovascular: Normal rate, regular rhythm, normal heart sounds and intact distal pulses.   Pulmonary/Chest: Effort normal and breath sounds normal.  Neurological: He is alert and oriented to person, place, and time.  Psychiatric: He has a normal mood and affect. His behavior is normal.   BP 119/67 mmHg  Pulse 61  Temp(Src) 97.1 F (36.2 C) (Oral)  Ht 6' (1.829 m)  Wt 302 lb (136.986 kg)  BMI 40.95 kg/m2        Assessment & Plan:  1. Type 2 diabetes mellitus without complication, without long-term current use of insulin (HCC) Check A1c today as long as it stays below 7 will continue with Actos - Bayer DCA Hb A1c Waived  2.  Hyperlipidemia Lipids were not quite at goal 6 months ago. We will reassess today - Lipid panel  3. Essential hypertension Blood pressure is very good at 119/67. He is on losartan hydrochlorothiazide - CMP14+  Frederica Kuster MD

## 2015-12-29 NOTE — Patient Instructions (Signed)
Medicare Annual Wellness Visit  [place] and the medical providers at [facility] strive to bring you the best medical care.  In doing so we not only want to address your current medical conditions and concerns but also to detect new conditions early and prevent illness, disease and health-related problems.    Medicare offers a yearly Wellness Visit which allows our clinical staff to assess your need for preventative services including immunizations, lifestyle education, counseling to decrease risk of preventable diseases and screening for fall risk and other medical concerns.    This visit is provided free of charge (no copay) for all Medicare recipients. The clinical pharmacists at [facility] have begun to conduct these Wellness Visits which will also include a thorough review of all your medications.    As you primary medical provider recommend that you make an appointment for your Annual Wellness Visit if you have not done so already this year.  You may set up this appointment before you leave today or you may call back ([phone]) and schedule an appointment.  Please make sure when you call that you mention that you are scheduling your Annual Wellness Visit with the clinical pharmacist so that the appointment may be made for the proper length of time.     Continue current medications. Continue good therapeutic lifestyle changes which include good diet and exercise. Fall precautions discussed with patient. If an FOBT was given today- please return it to our front desk. If you are over 50 years old - you may need Prevnar 13 or the adult Pneumonia vaccine.  **Flu shots are available--- please call and schedule a FLU-CLINIC appointment**  After your visit with us today you will receive a survey in the mail or online from [employer] regarding your care with us. Please take a moment to fill this out. Your feedback is very  important to us as you can help us better understand your patient needs as well as improve your experience and satisfaction. WE CARE ABOUT YOU!!!    

## 2015-12-30 LAB — CMP14+EGFR
A/G RATIO: 1.3 (ref 1.2–2.2)
ALBUMIN: 4 g/dL (ref 3.6–4.8)
ALT: 17 IU/L (ref 0–44)
AST: 17 IU/L (ref 0–40)
Alkaline Phosphatase: 81 IU/L (ref 39–117)
BUN / CREAT RATIO: 20 (ref 10–24)
BUN: 19 mg/dL (ref 8–27)
Bilirubin Total: 0.9 mg/dL (ref 0.0–1.2)
CALCIUM: 9.4 mg/dL (ref 8.6–10.2)
CO2: 23 mmol/L (ref 18–29)
CREATININE: 0.96 mg/dL (ref 0.76–1.27)
Chloride: 101 mmol/L (ref 96–106)
GFR calc Af Amer: 93 mL/min/{1.73_m2} (ref >59)
GFR, EST NON AFRICAN AMERICAN: 80 mL/min/{1.73_m2} (ref >59)
GLOBULIN, TOTAL: 3 g/dL (ref 1.5–4.5)
Glucose: 122 mg/dL — ABNORMAL HIGH (ref 65–99)
POTASSIUM: 4.6 mmol/L (ref 3.5–5.2)
SODIUM: 139 mmol/L (ref 134–144)
Total Protein: 7 g/dL (ref 6.0–8.5)

## 2015-12-30 LAB — LIPID PANEL
CHOL/HDL RATIO: 3.1 ratio (ref 0.0–5.0)
Cholesterol, Total: 145 mg/dL (ref 100–199)
HDL: 47 mg/dL (ref >39)
LDL CALC: 79 mg/dL (ref 0–99)
TRIGLYCERIDES: 96 mg/dL (ref 0–149)
VLDL Cholesterol Cal: 19 mg/dL (ref 5–40)

## 2016-01-05 ENCOUNTER — Other Ambulatory Visit: Payer: Self-pay | Admitting: Family Medicine

## 2016-06-13 ENCOUNTER — Other Ambulatory Visit: Payer: Self-pay | Admitting: Family Medicine

## 2016-07-04 LAB — HM DIABETES EYE EXAM

## 2016-07-06 NOTE — Progress Notes (Signed)
   Subjective:    Patient ID: Brett Jensen, male    DOB: 01/11/1946, 70 y.o.   MRN: [id number]  HPI 70-year-old gentleman here to follow-up diabetes, lipids, and blood pressure. When he checks sugars at home highest is usually 120, fasting. Lipids were last assessed 6 months ago and were at goal. Blood pressure continues to be controlled with an ARB and diuretic. He had eye exam earlier this week and was told he had a cataract on the right eye but no retinopathy.  Patient Active Problem List   Diagnosis Date Noted  . Arthritis   . Hyperlipidemia   . Obesity, unspecified 02/14/2013  . Erectile dysfunction 02/14/2013  . HTN (hypertension) 11/09/2010  . DM (diabetes mellitus) (HCC) 11/09/2010   Outpatient Encounter Prescriptions as of 07/07/2016  Medication Sig  . aspirin EC 81 MG tablet Take 81 mg by mouth daily.  Marland Kitchen. atorvastatin (LIPITOR) 20 MG tablet Take 1 tablet (20 mg total) by mouth daily.  . clobetasol cream (TEMOVATE) 0.05 % Apply 1 application topically 2 (two) times daily.  Marland Kitchen. glucose blood (ACCU-CHEK AVIVA PLUS) test strip Test once daily  . losartan-hydrochlorothiazide (HYZAAR) 100-12.5 MG tablet TAKE ONE TABLET BY MOUTH ONE TIME DAILY  . pioglitazone (ACTOS) 30 MG tablet Take 1 tablet (30 mg total) by mouth daily.  Marland Kitchen. triamcinolone cream (KENALOG) 0.1 % Apply 1 application topically 2 (two) times daily.  . [DISCONTINUED] pioglitazone (ACTOS) 30 MG tablet TAKE 1 TABLET DAILY   No facility-administered encounter medications on file as of 07/07/2016.       Review of Systems  Constitutional: Negative.   Respiratory: Negative.   Cardiovascular: Negative.   Genitourinary: Negative.   Neurological: Negative.        Objective:   Physical Exam  Constitutional: He is oriented to person, place, and time. He appears well-developed and well-nourished.  Cardiovascular: Normal rate, regular rhythm, normal heart sounds and intact distal pulses.   Pulmonary/Chest: Effort normal and  breath sounds normal.  Neurological: He is alert and oriented to person, place, and time.  Psychiatric: He has a normal mood and affect. His behavior is normal.   BP 138/66   Pulse (!) 55   Temp 97.1 F (36.2 C) (Oral)   Ht 6' (1.829 m)   Wt (!) 302 lb (137 kg)   BMI 40.96 kg/m         Assessment & Plan:  1. Type 2 diabetes mellitus without complication, without long-term current use of insulin (HCC) Last A1c was 6.4. Expect similar results today. Only medicine is Actos 30 mg. - Bayer DCA Hb A1c Waived  2. Essential hypertension Pressure controlled at 138/66. Continue same  3. Hyperlipidemia, unspecified hyperlipidemia type LDL cholesterol was 79 and total 145.  Brett KusterStephen M Miller MD

## 2016-07-07 ENCOUNTER — Encounter: Payer: Self-pay | Admitting: Family Medicine

## 2016-07-07 ENCOUNTER — Ambulatory Visit (INDEPENDENT_AMBULATORY_CARE_PROVIDER_SITE_OTHER): Payer: Medicare Other | Admitting: Family Medicine

## 2016-07-07 VITALS — BP 138/66 | HR 55 | Temp 97.1°F | Ht 72.0 in | Wt 302.0 lb

## 2016-07-07 DIAGNOSIS — I10 Essential (primary) hypertension: Secondary | ICD-10-CM

## 2016-07-07 DIAGNOSIS — E119 Type 2 diabetes mellitus without complications: Secondary | ICD-10-CM | POA: Diagnosis not present

## 2016-07-07 DIAGNOSIS — E785 Hyperlipidemia, unspecified: Secondary | ICD-10-CM

## 2016-07-07 LAB — BAYER DCA HB A1C WAIVED: HB A1C (BAYER DCA - WAIVED): 6.5 % (ref <7.0)

## 2016-07-07 MED ORDER — ATORVASTATIN CALCIUM 20 MG PO TABS: 20.0000 mg | ORAL_TABLET | Freq: Every day | ORAL | 1 refills | Status: DC

## 2016-07-07 MED ORDER — PIOGLITAZONE HCL 30 MG PO TABS: 30.0000 mg | ORAL_TABLET | Freq: Every day | ORAL | 1 refills | Status: DC

## 2016-07-07 MED ORDER — LOSARTAN POTASSIUM-HCTZ 100-12.5 MG PO TABS: 1.0000 | ORAL_TABLET | Freq: Every day | ORAL | 1 refills | Status: DC

## 2016-07-07 NOTE — Addendum Note (Signed)
Addended by: Fawn KirkHOLT, CATHY on: 07/07/2016 09:29 AM   Modules accepted: Orders

## 2016-07-26 ENCOUNTER — Other Ambulatory Visit: Payer: Self-pay | Admitting: Family Medicine

## 2016-10-24 ENCOUNTER — Other Ambulatory Visit: Payer: Self-pay | Admitting: *Deleted

## 2016-10-26 ENCOUNTER — Other Ambulatory Visit: Payer: Self-pay

## 2016-10-26 MED ORDER — ACCU-CHEK AVIVA PLUS W/DEVICE KIT: 1.0000 | PACK | Freq: Two times a day (BID) | 0 refills | Status: DC

## 2017-01-02 ENCOUNTER — Ambulatory Visit (INDEPENDENT_AMBULATORY_CARE_PROVIDER_SITE_OTHER): Payer: Medicare Other | Admitting: Family Medicine

## 2017-01-02 ENCOUNTER — Encounter: Payer: Self-pay | Admitting: Family Medicine

## 2017-01-02 VITALS — BP 133/81 | HR 70 | Temp 97.3°F | Ht 72.0 in | Wt 302.0 lb

## 2017-01-02 DIAGNOSIS — E11319 Type 2 diabetes mellitus with unspecified diabetic retinopathy without macular edema: Secondary | ICD-10-CM | POA: Diagnosis not present

## 2017-01-02 DIAGNOSIS — Z1159 Encounter for screening for other viral diseases: Secondary | ICD-10-CM | POA: Diagnosis not present

## 2017-01-02 DIAGNOSIS — I10 Essential (primary) hypertension: Secondary | ICD-10-CM

## 2017-01-02 DIAGNOSIS — E782 Mixed hyperlipidemia: Secondary | ICD-10-CM | POA: Diagnosis not present

## 2017-01-02 LAB — BAYER DCA HB A1C WAIVED: HB A1C: 6.2 % (ref <7.0)

## 2017-01-02 MED ORDER — LOSARTAN POTASSIUM-HCTZ 100-12.5 MG PO TABS: 1.0000 | ORAL_TABLET | Freq: Every day | ORAL | 1 refills | Status: DC

## 2017-01-02 MED ORDER — PIOGLITAZONE HCL 30 MG PO TABS: 30.0000 mg | ORAL_TABLET | Freq: Every day | ORAL | 1 refills | Status: DC

## 2017-01-02 MED ORDER — ATORVASTATIN CALCIUM 20 MG PO TABS: 20.0000 mg | ORAL_TABLET | Freq: Every day | ORAL | 1 refills | Status: DC

## 2017-01-02 NOTE — Progress Notes (Signed)
 BP 133/81   Pulse 70   Temp 97.3 F (36.3 C) (Oral)   Ht 6' (1.829 m)   Wt (!) 302 lb (137 kg)   BMI 40.96 kg/m    Subjective:    Patient ID: Brett Jensen, male    DOB: 05/24/1946, 71 y.o.   MRN: [id number]  HPI: Brett Jensen is a 71 y.o. male presenting on 01/02/2017 for Diabetes (6 mo; patient is fasting); Hyperlipidemia; Hypertension; and Numbness in leg (shin of right leg radiating into foot)   HPI Type 2 diabetes mellitus Patient comes in today for recheck of his diabetes. Patient has been currently taking Actos. Patient is currently on an ACE inhibitor. Patient has not seen an ophthalmologist this year, he saw one in December 2017 which showed he had retinopathy and he also has cataracts replaced. Patient numbness on right anterior shin but otherwise normal in his lower extremities..   Hypertension Patient is currently on losartan-hydrochlorothiazide, and her blood pressure today is 133/81. Patient denies any lightheadedness or dizziness. Patient denies headaches, blurred vision, chest pains, shortness of breath, or weakness. Denies any side effects from medication and is content with current medication.   Hyperlipidemia Patient is coming in for recheck of his hyperlipidemia. He is currently taking Lipitor. He denies any issues with myalgias or history of liver damage from it. He denies any focal numbness or weakness or chest pain. Patient knows he is overweight/obese, he hasn't had a lot of success with this and doesn't seem like he really wants to make that much of changes but he says he will keep trying  Relevant past medical, surgical, family and social history reviewed and updated as indicated. Interim medical history since our last visit reviewed. Allergies and medications reviewed and updated.  Review of Systems  Constitutional: Negative for chills and fever.  Eyes: Negative for visual disturbance.  Respiratory: Negative for shortness of breath and wheezing.     Cardiovascular: Negative for chest pain and leg swelling.  Musculoskeletal: Negative for back pain and gait problem.  Skin: Negative for rash.  Neurological: Positive for numbness. Negative for dizziness, weakness, light-headedness and headaches.  All other systems reviewed and are negative.   Per HPI unless specifically indicated above        Objective:    BP 133/81   Pulse 70   Temp 97.3 F (36.3 C) (Oral)   Ht 6' (1.829 m)   Wt (!) 302 lb (137 kg)   BMI 40.96 kg/m   Wt Readings from Last 3 Encounters:  01/02/17 (!) 302 lb (137 kg)  07/07/16 (!) 302 lb (137 kg)  12/29/15 (!) 302 lb (137 kg)    Physical Exam  Constitutional: He is oriented to person, place, and time. He appears well-developed and well-nourished. No distress.  Eyes: Conjunctivae are normal. No scleral icterus.  Neck: Neck supple. No thyromegaly present.  Cardiovascular: Normal rate, regular rhythm, normal heart sounds and intact distal pulses.   No murmur heard. Pulmonary/Chest: Effort normal and breath sounds normal. No respiratory distress. He has no wheezes. He has no rales.  Musculoskeletal: Normal range of motion. He exhibits no edema.  Lymphadenopathy:    He has no cervical adenopathy.  Neurological: He is alert and oriented to person, place, and time. Coordination normal.  Skin: Skin is warm and dry. No rash noted. He is not diaphoretic.  Psychiatric: He has a normal mood and affect. His behavior is normal.  Nursing note and vitals reviewed.     Results for orders placed or performed in visit on 07/14/16  HM DIABETES EYE EXAM  Result Value Ref Range   HM Diabetic Eye Exam Retinopathy (A) No Retinopathy      Assessment & Plan:   Problem List Items Addressed This Visit      Cardiovascular and Mediastinum   HTN (hypertension)   Relevant Medications   losartan-hydrochlorothiazide (HYZAAR) 100-12.5 MG tablet   atorvastatin (LIPITOR) 20 MG tablet   Other Relevant Orders   CMP14+EGFR      Endocrine   DM (diabetes mellitus) (HCC)   Relevant Medications   pioglitazone (ACTOS) 30 MG tablet   losartan-hydrochlorothiazide (HYZAAR) 100-12.5 MG tablet   atorvastatin (LIPITOR) 20 MG tablet   Other Relevant Orders   Bayer DCA Hb A1c Waived     Other   Morbid obesity ([place])   Relevant Medications   pioglitazone (ACTOS) 30 MG tablet   Other Relevant Orders   Lipid panel   Hyperlipidemia - Primary   Relevant Medications   losartan-hydrochlorothiazide (HYZAAR) 100-12.5 MG tablet   atorvastatin (LIPITOR) 20 MG tablet   Other Relevant Orders   Lipid panel    Other Visit Diagnoses    Need for hepatitis C screening test       Relevant Orders   Hepatitis C antibody       Follow up plan: Return in about 6 months (around 07/04/2017), or if symptoms worsen or fail to improve, for Diabetes and hypertension.  Counseling provided for all of the vaccine components Orders Placed This Encounter  Procedures  . Bayer DCA Hb A1c Waived  . CMP14+EGFR  . Lipid panel  . Hepatitis C antibody    Caryl Pina, MD [facility] 01/02/2017, 9:11 AM

## 2017-01-03 LAB — CMP14+EGFR
A/G RATIO: 1.5 (ref 1.2–2.2)
ALBUMIN: 4.3 g/dL (ref 3.5–4.8)
ALT: 17 IU/L (ref 0–44)
AST: 22 IU/L (ref 0–40)
Alkaline Phosphatase: 69 IU/L (ref 39–117)
BUN / CREAT RATIO: 14 (ref 10–24)
BUN: 12 mg/dL (ref 8–27)
Bilirubin Total: 1.1 mg/dL (ref 0.0–1.2)
CALCIUM: 9.4 mg/dL (ref 8.6–10.2)
CO2: 26 mmol/L (ref 18–29)
CREATININE: 0.88 mg/dL (ref 0.76–1.27)
Chloride: 101 mmol/L (ref 96–106)
GFR calc Af Amer: 100 mL/min/{1.73_m2} (ref >59)
GFR, EST NON AFRICAN AMERICAN: 86 mL/min/{1.73_m2} (ref >59)
GLOBULIN, TOTAL: 2.8 g/dL (ref 1.5–4.5)
Glucose: 125 mg/dL — ABNORMAL HIGH (ref 65–99)
POTASSIUM: 4.3 mmol/L (ref 3.5–5.2)
SODIUM: 139 mmol/L (ref 134–144)
Total Protein: 7.1 g/dL (ref 6.0–8.5)

## 2017-01-03 LAB — LIPID PANEL
CHOL/HDL RATIO: 3 ratio (ref 0.0–5.0)
Cholesterol, Total: 131 mg/dL (ref 100–199)
HDL: 44 mg/dL (ref >39)
LDL CALC: 69 mg/dL (ref 0–99)
Triglycerides: 92 mg/dL (ref 0–149)
VLDL Cholesterol Cal: 18 mg/dL (ref 5–40)

## 2017-01-03 LAB — HEPATITIS C ANTIBODY

## 2017-07-05 ENCOUNTER — Ambulatory Visit: Payer: Medicare Other | Admitting: Family Medicine

## 2017-07-05 ENCOUNTER — Encounter: Payer: Self-pay | Admitting: Family Medicine

## 2017-07-05 VITALS — BP 136/75 | HR 64 | Temp 97.3°F | Ht 72.0 in | Wt 305.0 lb

## 2017-07-05 DIAGNOSIS — E11319 Type 2 diabetes mellitus with unspecified diabetic retinopathy without macular edema: Secondary | ICD-10-CM | POA: Diagnosis not present

## 2017-07-05 DIAGNOSIS — I10 Essential (primary) hypertension: Secondary | ICD-10-CM

## 2017-07-05 DIAGNOSIS — E1159 Type 2 diabetes mellitus with other circulatory complications: Secondary | ICD-10-CM | POA: Diagnosis not present

## 2017-07-05 DIAGNOSIS — Z23 Encounter for immunization: Secondary | ICD-10-CM

## 2017-07-05 DIAGNOSIS — E785 Hyperlipidemia, unspecified: Secondary | ICD-10-CM | POA: Diagnosis not present

## 2017-07-05 DIAGNOSIS — I152 Hypertension secondary to endocrine disorders: Secondary | ICD-10-CM

## 2017-07-05 DIAGNOSIS — M1711 Unilateral primary osteoarthritis, right knee: Secondary | ICD-10-CM | POA: Diagnosis not present

## 2017-07-05 DIAGNOSIS — E1169 Type 2 diabetes mellitus with other specified complication: Secondary | ICD-10-CM | POA: Diagnosis not present

## 2017-07-05 LAB — BAYER DCA HB A1C WAIVED: HB A1C (BAYER DCA - WAIVED): 6.5 % (ref <7.0)

## 2017-07-05 LAB — HM DIABETES EYE EXAM

## 2017-07-05 MED ORDER — METHYLPREDNISOLONE ACETATE 80 MG/ML IJ SUSP
80.0000 mg | Freq: Once | INTRAMUSCULAR | Status: AC
  Administered 2017-07-05: 80 mg via INTRAMUSCULAR

## 2017-07-05 NOTE — Progress Notes (Signed)
BP 136/75   Pulse 64   Temp (!) 97.3 F (36.3 C) (Oral)   Ht 6' (1.829 m)   Wt (!) 305 lb (138.3 kg)   BMI 41.37 kg/m    Subjective:    Patient ID: Brett Jensen, male    DOB: 05-01-1946, 71 y.o.   MRN: [id number]  HPI: Brett Jensen is a 71 y.o. male presenting on 07/05/2017 for Diabetes (6 mo); Hyperlipidemia (stopped taking Lipitor because of joint pain, about a month ago); Hypertension; and Knee Pain (right knee,would like injection)   HPI Hyperlipidemia Patient is coming in for recheck of his hyperlipidemia. The patient is currently taking Lipitor but says having some myalgias wants to stop it for a period see if they improve. They deny any issues with history of liver damage from it. They deny any focal numbness or weakness or chest pain.   Type 2 diabetes mellitus Patient comes in today for recheck of his diabetes. Patient has been currently taking Actos. Patient is currently on an ACE inhibitor/ARB. Patient has not seen an ophthalmologist this year. Patient denies any issues with their feet.   Hypertension Patient is currently on losartan-hydrochlorothiazide, and their blood pressure today is 136/75. Patient denies any lightheadedness or dizziness. Patient denies headaches, blurred vision, chest pains, shortness of breath, or weakness. Denies any side effects from medication and is content with current medication.   Right knee osteoarthritis and pain Patient is coming in with right knee pain that is been bothering him over the past couple weeks.  He says he has had this issue before and has had injections for before and the most recent injection was over a year ago.  He says it is starting to bother him a lot especially on the middle aspect of his knee and he would like to get another injection to help with that.  He denies any fevers or chills or redness or warmth.  Relevant past medical, surgical, family and social history reviewed and updated as indicated. Interim medical  history since our last visit reviewed. Allergies and medications reviewed and updated.  Review of Systems  Constitutional: Negative for chills and fever.  Eyes: Negative for discharge.  Respiratory: Negative for shortness of breath and wheezing.   Cardiovascular: Negative for chest pain and leg swelling.  Musculoskeletal: Positive for arthralgias. Negative for back pain, gait problem and joint swelling.  Skin: Negative for color change and rash.  Neurological: Negative for dizziness, weakness, light-headedness and numbness.  All other systems reviewed and are negative.   Per HPI unless specifically indicated above        Objective:    BP 136/75   Pulse 64   Temp (!) 97.3 F (36.3 C) (Oral)   Ht 6' (1.829 m)   Wt (!) 305 lb (138.3 kg)   BMI 41.37 kg/m   Wt Readings from Last 3 Encounters:  07/05/17 (!) 305 lb (138.3 kg)  01/02/17 (!) 302 lb (137 kg)  07/07/16 (!) 302 lb (137 kg)    Physical Exam  Constitutional: He is oriented to person, place, and time. He appears well-developed and well-nourished. No distress.  Eyes: Conjunctivae are normal. No scleral icterus.  Neck: Neck supple. No thyromegaly present.  Cardiovascular: Normal rate, regular rhythm, normal heart sounds and intact distal pulses.  No murmur heard. Pulmonary/Chest: Effort normal and breath sounds normal. No respiratory distress. He has no wheezes.  Musculoskeletal: Normal range of motion. He exhibits tenderness (Medial right knee tenderness, unable to  elicit with range of motion or via palpation.). He exhibits no edema.       Right knee: He exhibits normal range of motion, no swelling, no effusion, no deformity, normal alignment, no LCL laxity, normal patellar mobility, no bony tenderness, normal meniscus and no MCL laxity. Tenderness found. Medial joint line tenderness noted.  Lymphadenopathy:    He has no cervical adenopathy.  Neurological: He is alert and oriented to person, place, and time.  Coordination normal.  Skin: Skin is warm and dry. No rash noted. He is not diaphoretic.  Psychiatric: He has a normal mood and affect. His behavior is normal.  Nursing note and vitals reviewed.   Knee injection: Consent form signed. Risk factors of bleeding and infection discussed with patient and patient is agreeable towards injection. Patient prepped with Betadine. Lateral approach towards injection used. Injected 80 mg of Depo-Medrol and 1 mL of 2% lidocaine. Patient tolerated procedure well and no side effects from noted. Minimal to no bleeding. Simple bandage applied after.     Assessment & Plan:   Problem List Items Addressed This Visit      Cardiovascular and Mediastinum   Hypertension associated with diabetes ([place]) - Primary   Relevant Orders   CMP14+EGFR     Endocrine   DM (diabetes mellitus) ([place])   Relevant Orders   CMP14+EGFR   Bayer DCA Hb A1c Waived   Hyperlipidemia associated with type 2 diabetes mellitus ([place])   Relevant Orders   Lipid panel     Other   Morbid obesity ([place])   Relevant Orders   Lipid panel    Other Visit Diagnoses    Primary osteoarthritis of right knee       Relevant Medications   methylPREDNISolone acetate (DEPO-MEDROL) injection 80 mg (Start on 07/05/2017  8:45 AM)       Follow up plan: Return in about 3 months (around 10/03/2017), or if symptoms worsen or fail to improve, for Diabetes and hypertension.  Counseling provided for all of the vaccine components Orders Placed This Encounter  Procedures  . Pneumococcal polysaccharide vaccine 23-valent greater than or equal to 2yo subcutaneous/IM  . CMP14+EGFR  . Lipid panel  . Bayer [facility] Hb A1c Waived    Caryl Pina, MD [facility] 07/05/2017, 8:29 AM

## 2017-07-06 LAB — LIPID PANEL
CHOLESTEROL TOTAL: 147 mg/dL (ref 100–199)
Chol/HDL Ratio: 3.3 ratio (ref 0.0–5.0)
HDL: 45 mg/dL (ref >39)
LDL Calculated: 83 mg/dL (ref 0–99)
TRIGLYCERIDES: 96 mg/dL (ref 0–149)
VLDL Cholesterol Cal: 19 mg/dL (ref 5–40)

## 2017-07-06 LAB — CMP14+EGFR
A/G RATIO: 1.5 (ref 1.2–2.2)
ALK PHOS: 73 IU/L (ref 39–117)
ALT: 23 IU/L (ref 0–44)
AST: 27 IU/L (ref 0–40)
Albumin: 4.2 g/dL (ref 3.5–4.8)
BUN/Creatinine Ratio: 14 (ref 10–24)
BUN: 14 mg/dL (ref 8–27)
Bilirubin Total: 0.9 mg/dL (ref 0.0–1.2)
CALCIUM: 9.4 mg/dL (ref 8.6–10.2)
CHLORIDE: 101 mmol/L (ref 96–106)
CO2: 24 mmol/L (ref 20–29)
Creatinine, Ser: 0.99 mg/dL (ref 0.76–1.27)
GFR calc Af Amer: 88 mL/min/{1.73_m2} (ref >59)
GFR, EST NON AFRICAN AMERICAN: 76 mL/min/{1.73_m2} (ref >59)
GLOBULIN, TOTAL: 2.8 g/dL (ref 1.5–4.5)
Glucose: 124 mg/dL — ABNORMAL HIGH (ref 65–99)
POTASSIUM: 5.1 mmol/L (ref 3.5–5.2)
SODIUM: 141 mmol/L (ref 134–144)
Total Protein: 7 g/dL (ref 6.0–8.5)

## 2017-07-18 ENCOUNTER — Other Ambulatory Visit: Payer: Self-pay | Admitting: Family Medicine

## 2017-07-18 DIAGNOSIS — I10 Essential (primary) hypertension: Secondary | ICD-10-CM

## 2017-07-18 DIAGNOSIS — E11319 Type 2 diabetes mellitus with unspecified diabetic retinopathy without macular edema: Secondary | ICD-10-CM

## 2017-10-23 ENCOUNTER — Other Ambulatory Visit: Payer: Self-pay | Admitting: Family Medicine

## 2017-10-23 DIAGNOSIS — E11319 Type 2 diabetes mellitus with unspecified diabetic retinopathy without macular edema: Secondary | ICD-10-CM

## 2017-10-23 NOTE — Telephone Encounter (Signed)
OV 01/03/18 6mos

## 2017-11-06 ENCOUNTER — Other Ambulatory Visit: Payer: Self-pay | Admitting: Family Medicine

## 2017-11-06 ENCOUNTER — Telehealth: Payer: Self-pay | Admitting: Family Medicine

## 2017-11-06 MED ORDER — CLOBETASOL PROPIONATE 0.05 % EX CREA
1.0000 | TOPICAL_CREAM | Freq: Two times a day (BID) | CUTANEOUS | 1 refills | Status: DC
Start: 2017-11-06 — End: 2020-05-20

## 2017-11-06 NOTE — Telephone Encounter (Signed)
Left detailed message rx sent  °

## 2018-01-03 ENCOUNTER — Encounter: Payer: Self-pay | Admitting: Family Medicine

## 2018-01-03 ENCOUNTER — Ambulatory Visit: Payer: Medicare Other | Admitting: Family Medicine

## 2018-01-03 VITALS — BP 137/70 | HR 61 | Temp 97.4°F | Ht 72.0 in | Wt 307.0 lb

## 2018-01-03 DIAGNOSIS — E1159 Type 2 diabetes mellitus with other circulatory complications: Secondary | ICD-10-CM | POA: Diagnosis not present

## 2018-01-03 DIAGNOSIS — E11319 Type 2 diabetes mellitus with unspecified diabetic retinopathy without macular edema: Secondary | ICD-10-CM | POA: Diagnosis not present

## 2018-01-03 DIAGNOSIS — T466X5A Adverse effect of antihyperlipidemic and antiarteriosclerotic drugs, initial encounter: Secondary | ICD-10-CM | POA: Insufficient documentation

## 2018-01-03 DIAGNOSIS — Z23 Encounter for immunization: Secondary | ICD-10-CM

## 2018-01-03 DIAGNOSIS — M791 Myalgia, unspecified site: Secondary | ICD-10-CM

## 2018-01-03 DIAGNOSIS — E785 Hyperlipidemia, unspecified: Secondary | ICD-10-CM

## 2018-01-03 DIAGNOSIS — E1169 Type 2 diabetes mellitus with other specified complication: Secondary | ICD-10-CM | POA: Diagnosis not present

## 2018-01-03 DIAGNOSIS — I10 Essential (primary) hypertension: Secondary | ICD-10-CM | POA: Diagnosis not present

## 2018-01-03 LAB — BAYER DCA HB A1C WAIVED: HB A1C: 6.1 % (ref <7.0)

## 2018-01-03 NOTE — Progress Notes (Signed)
BP 137/70   Pulse 61   Temp (!) 97.4 F (36.3 C) (Oral)   Ht 6' (1.829 m)   Wt (!) 307 lb (139.3 kg)   BMI 41.64 kg/m    Subjective:    Patient ID: Brett Jensen, male    DOB: 11/23/1945, 72 y.o.   MRN: [id number]  HPI: Brett Jensen is a 72 y.o. male presenting on 01/03/2018 for Diabetes; Hypertension; and Hyperlipidemia (stopped Lipitor 6 months ago because of joint pain)   HPI Type 2 diabetes mellitus Patient comes in today for recheck of his diabetes. Patient has been currently taking Actos. Patient is currently on an ACE inhibitor/ARB. Patient has seen an ophthalmologist this year. Patient denies any issues with their feet.   Hypertension Patient is currently on losartan-hydrochlorothiazide, and their blood pressure today is 137/70. Patient denies any lightheadedness or dizziness. Patient denies headaches, blurred vision, chest pains, shortness of breath, or weakness. Denies any side effects from medication and is content with current medication.   Hyperlipidemia Patient is coming in for recheck of his hyperlipidemia. The patient is currently taking atorvastatin but patient says he has not taken it in 3 or 4 months because of myalgias and he does not want to try any other statins because he said he is tried at least 2 or 3 other ones.. They deny any issues with myalgias or history of liver damage from it. They deny any focal numbness or weakness or chest pain.  Patient is morbidly obese and we discussed this and he says he is trying but he still gaining weight.  He says he has not had myalgia issues like when he had been on the statin  Relevant past medical, surgical, family and social history reviewed and updated as indicated. Interim medical history since our last visit reviewed. Allergies and medications reviewed and updated.  Review of Systems  Constitutional: Negative for chills and fever.  Respiratory: Negative for shortness of breath and wheezing.   Cardiovascular:  Negative for chest pain and leg swelling.  Musculoskeletal: Positive for myalgias. Negative for back pain and gait problem.  Skin: Negative for rash.  Neurological: Negative for dizziness, weakness, light-headedness and numbness.  All other systems reviewed and are negative.   Per HPI unless specifically indicated above   Allergies as of 01/03/2018   No Known Allergies     Medication List        Accurate as of 01/03/18  8:34 AM. Always use your most recent med list.          ACCU-CHEK AVIVA PLUS test strip Generic drug:  glucose blood CHECK BLOOD SUGAR ONCE A DAY OR AS DIRECTED   ACCU-CHEK AVIVA PLUS w/Device Kit 1 Device by Does not apply route 2 (two) times daily at 10 AM and 5 PM.   aspirin EC 81 MG tablet Take 81 mg by mouth daily.   atorvastatin 20 MG tablet Commonly known as:  LIPITOR Take 1 tablet (20 mg total) by mouth daily.   clobetasol cream 0.05 % Commonly known as:  TEMOVATE Apply 1 application topically 2 (two) times daily.   losartan-hydrochlorothiazide 100-12.5 MG tablet Commonly known as:  HYZAAR TAKE 1 TABLET DAILY   pioglitazone 30 MG tablet Commonly known as:  ACTOS TAKE 1 TABLET DAILY   triamcinolone cream 0.1 % Commonly known as:  KENALOG APPLY TO AFFECTED AREAS TWICE A DAY          Objective:    BP 137/70   Pulse  61   Temp (!) 97.4 F (36.3 C) (Oral)   Ht 6' (1.829 m)   Wt (!) 307 lb (139.3 kg)   BMI 41.64 kg/m   Wt Readings from Last 3 Encounters:  01/03/18 (!) 307 lb (139.3 kg)  07/05/17 (!) 305 lb (138.3 kg)  01/02/17 (!) 302 lb (137 kg)    Physical Exam  Constitutional: He is oriented to person, place, and time. He appears well-developed and well-nourished. No distress.  Eyes: Conjunctivae are normal. No scleral icterus.  Neck: Neck supple. No thyromegaly present.  Cardiovascular: Normal rate, regular rhythm, normal heart sounds and intact distal pulses.  No murmur heard. Pulmonary/Chest: Effort normal and breath  sounds normal. No respiratory distress. He has no wheezes.  Musculoskeletal: Normal range of motion. He exhibits edema (1+ bilateral).  Lymphadenopathy:    He has no cervical adenopathy.  Neurological: He is alert and oriented to person, place, and time. Coordination normal.  Skin: Skin is warm and dry. No rash noted. He is not diaphoretic.  Psychiatric: He has a normal mood and affect. His behavior is normal.  Nursing note and vitals reviewed.   Results for orders placed or performed in visit on 09/01/17  HM DIABETES EYE EXAM  Result Value Ref Range   HM Diabetic Eye Exam No Retinopathy No Retinopathy      Assessment & Plan:   Problem List Items Addressed This Visit      Cardiovascular and Mediastinum   Hypertension associated with diabetes ([place])   Relevant Orders   CBC with Differential/Platelet     Endocrine   DM (diabetes mellitus) ([place]) - Primary   Relevant Orders   Microalbumin / creatinine urine ratio   Bayer DCA Hb A1c Waived   CMP14+EGFR   Hyperlipidemia associated with type 2 diabetes mellitus ([place])   Relevant Orders   Lipid panel     Other   Morbid obesity ([place])   Myalgia due to statin      Patient has been having trouble with his diet and gaining weight so, we discussed this  Patient's blood sugars he says are running around 106, will check A1c today.  Foot exam normal, patient already got his eye exam this year   Follow up plan: Return in about 6 months (around 07/05/2018), or if symptoms worsen or fail to improve, for Diabetes and hypertension recheck.  Counseling provided for all of the vaccine components Orders Placed This Encounter  Procedures  . Microalbumin / creatinine urine ratio  . Bayer DCA Hb A1c Waived  . CMP14+EGFR  . CBC with Differential/Platelet  . Lipid panel    Caryl Pina, MD [facility] 01/03/2018, 8:34 AM

## 2018-01-03 NOTE — Addendum Note (Signed)
Addended by: Lorelee CoverOSTOSKY, JESSICA C on: 01/03/2018 01:39 PM   Modules accepted: Orders

## 2018-01-04 LAB — CBC WITH DIFFERENTIAL/PLATELET
Basophils Absolute: 0 10*3/uL (ref 0.0–0.2)
Basos: 0 %
EOS (ABSOLUTE): 0.2 10*3/uL (ref 0.0–0.4)
Eos: 3 %
Hematocrit: 40.9 % (ref 37.5–51.0)
Hemoglobin: 13.2 g/dL (ref 13.0–17.7)
IMMATURE GRANULOCYTES: 0 %
Immature Grans (Abs): 0 10*3/uL (ref 0.0–0.1)
LYMPHS ABS: 2.5 10*3/uL (ref 0.7–3.1)
Lymphs: 38 %
MCH: 29.1 pg (ref 26.6–33.0)
MCHC: 32.3 g/dL (ref 31.5–35.7)
MCV: 90 fL (ref 79–97)
MONOS ABS: 0.6 10*3/uL (ref 0.1–0.9)
Monocytes: 9 %
NEUTROS PCT: 50 %
Neutrophils Absolute: 3.3 10*3/uL (ref 1.4–7.0)
PLATELETS: 301 10*3/uL (ref 150–450)
RBC: 4.53 x10E6/uL (ref 4.14–5.80)
RDW: 13.4 % (ref 12.3–15.4)
WBC: 6.7 10*3/uL (ref 3.4–10.8)

## 2018-01-04 LAB — CMP14+EGFR
A/G RATIO: 1.3 (ref 1.2–2.2)
ALT: 23 IU/L (ref 0–44)
AST: 28 IU/L (ref 0–40)
Albumin: 4 g/dL (ref 3.5–4.8)
Alkaline Phosphatase: 67 IU/L (ref 39–117)
BILIRUBIN TOTAL: 0.9 mg/dL (ref 0.0–1.2)
BUN / CREAT RATIO: 14 (ref 10–24)
BUN: 15 mg/dL (ref 8–27)
CO2: 25 mmol/L (ref 20–29)
CREATININE: 1.08 mg/dL (ref 0.76–1.27)
Calcium: 9.8 mg/dL (ref 8.6–10.2)
Chloride: 100 mmol/L (ref 96–106)
GFR calc Af Amer: 79 mL/min/{1.73_m2} (ref >59)
GFR calc non Af Amer: 68 mL/min/{1.73_m2} (ref >59)
GLOBULIN, TOTAL: 3.2 g/dL (ref 1.5–4.5)
Glucose: 124 mg/dL — ABNORMAL HIGH (ref 65–99)
POTASSIUM: 4.6 mmol/L (ref 3.5–5.2)
SODIUM: 138 mmol/L (ref 134–144)
Total Protein: 7.2 g/dL (ref 6.0–8.5)

## 2018-01-04 LAB — LIPID PANEL
CHOL/HDL RATIO: 3.8 ratio (ref 0.0–5.0)
Cholesterol, Total: 169 mg/dL (ref 100–199)
HDL: 44 mg/dL (ref >39)
LDL Calculated: 104 mg/dL — ABNORMAL HIGH (ref 0–99)
TRIGLYCERIDES: 106 mg/dL (ref 0–149)
VLDL Cholesterol Cal: 21 mg/dL (ref 5–40)

## 2018-03-05 ENCOUNTER — Other Ambulatory Visit: Payer: Self-pay | Admitting: Family Medicine

## 2018-03-05 DIAGNOSIS — I10 Essential (primary) hypertension: Secondary | ICD-10-CM

## 2018-03-06 ENCOUNTER — Telehealth: Payer: Self-pay | Admitting: Family Medicine

## 2018-03-06 NOTE — Telephone Encounter (Signed)
OV 01/03/18 RTC 6 mos

## 2018-06-04 ENCOUNTER — Other Ambulatory Visit: Payer: Self-pay | Admitting: Family Medicine

## 2018-06-04 DIAGNOSIS — I10 Essential (primary) hypertension: Secondary | ICD-10-CM

## 2018-06-05 NOTE — Telephone Encounter (Signed)
OV 01/03/18 rtc 6 mos

## 2018-07-02 ENCOUNTER — Encounter: Payer: Self-pay | Admitting: Family Medicine

## 2018-07-02 ENCOUNTER — Ambulatory Visit: Payer: Medicare Other | Admitting: Family Medicine

## 2018-07-02 VITALS — BP 129/65 | HR 61 | Temp 96.7°F | Ht 72.0 in | Wt 302.0 lb

## 2018-07-02 DIAGNOSIS — E1159 Type 2 diabetes mellitus with other circulatory complications: Secondary | ICD-10-CM | POA: Diagnosis not present

## 2018-07-02 DIAGNOSIS — I739 Peripheral vascular disease, unspecified: Secondary | ICD-10-CM

## 2018-07-02 DIAGNOSIS — E1169 Type 2 diabetes mellitus with other specified complication: Secondary | ICD-10-CM | POA: Diagnosis not present

## 2018-07-02 DIAGNOSIS — I10 Essential (primary) hypertension: Secondary | ICD-10-CM

## 2018-07-02 DIAGNOSIS — E785 Hyperlipidemia, unspecified: Secondary | ICD-10-CM

## 2018-07-02 DIAGNOSIS — I152 Hypertension secondary to endocrine disorders: Secondary | ICD-10-CM

## 2018-07-02 LAB — BAYER DCA HB A1C WAIVED: HB A1C (BAYER DCA - WAIVED): 6.5 % (ref <7.0)

## 2018-07-02 NOTE — Progress Notes (Addendum)
BP 129/65   Pulse 61   Temp (!) 96.7 F (35.9 C) (Oral)   Ht 6' (1.829 m)   Wt (!) 302 lb (137 kg)   BMI 40.96 kg/m    Subjective:    Patient ID: Brett Jensen, male    DOB: [birth year], 72 y.o.   MRN: [id number]  HPI: Brett Jensen is a 72 y.o. male presenting on 07/02/2018 for Diabetes (6 month followup- Patient states when he walks he gets numbness in right leg that goes down to his foot. States it has been going on for over a year.) and Hypertension   HPI Type 2 diabetes mellitus Patient comes in today for recheck of his diabetes. Patient has been currently taking Actos. Patient is currently on an ACE inhibitor/ARB. Patient has seen an ophthalmologist this year.  Patient is complaining of right lower leg numbness and cramping that goes from just below the knee all the way down into his foot.  He says when he is just sitting there he does not have any problems or just standing there is not have any problems with his soon as he starts walking he starts getting this cramping and numbness and pain in his right lower leg below the knee down to the foot.  He says this is really been picking up over the past 6 months and he used to love going dancing and now he can go dancing and he cannot walk like he did before because of the pain in there.  Hypertension Patient is currently on losartan-hctz, and their blood pressure today is 129/65. Patient denies any lightheadedness or dizziness. Patient denies headaches, blurred vision, chest pains, shortness of breath, or weakness. Denies any side effects from medication and is content with current medication.   Hyperlipidemia Patient is coming in for recheck of his hyperlipidemia. The patient is currently taking Lipitor. They deny any issues with myalgias or history of liver damage from it. They deny any focal numbness or weakness or chest pain.  Discussed weight and diet and lifestyle changes, patient is resistant to changes at this point but we  discussed options that he could do.  Relevant past medical, surgical, family and social history reviewed and updated as indicated. Interim medical history since our last visit reviewed. Allergies and medications reviewed and updated.  Review of Systems  Constitutional: Negative for chills and fever.  Eyes: Negative for visual disturbance.  Respiratory: Negative for shortness of breath and wheezing.   Cardiovascular: Negative for chest pain and leg swelling.  Musculoskeletal: Positive for myalgias. Negative for arthralgias, back pain and gait problem.  Skin: Negative for rash.  Neurological: Positive for numbness. Negative for dizziness, weakness and light-headedness.  All other systems reviewed and are negative.   Per HPI unless specifically indicated above   Allergies as of 07/02/2018   No Known Allergies     Medication List        Accurate as of 07/02/18 11:59 PM. Always use your most recent med list.          ACCU-CHEK AVIVA PLUS test strip Generic drug:  glucose blood CHECK BLOOD SUGAR ONCE A DAY OR AS DIRECTED   ACCU-CHEK AVIVA PLUS w/Device Kit 1 Device by Does not apply route 2 (two) times daily at 10 AM and 5 PM.   aspirin EC 81 MG tablet Take 81 mg by mouth daily.   atorvastatin 20 MG tablet Commonly known as:  LIPITOR Take 1 tablet (20 mg total) by mouth  daily.   clobetasol cream 0.05 % Commonly known as:  TEMOVATE Apply 1 application topically 2 (two) times daily.   losartan-hydrochlorothiazide 100-12.5 MG tablet Commonly known as:  HYZAAR TAKE 1 TABLET DAILY   pioglitazone 30 MG tablet Commonly known as:  ACTOS TAKE 1 TABLET DAILY   triamcinolone cream 0.1 % Commonly known as:  KENALOG APPLY TO AFFECTED AREAS TWICE A DAY          Objective:    BP 129/65   Pulse 61   Temp (!) 96.7 F (35.9 C) (Oral)   Ht 6' (1.829 m)   Wt (!) 302 lb (137 kg)   BMI 40.96 kg/m   Wt Readings from Last 3 Encounters:  07/02/18 (!) 302 lb (137 kg)    01/03/18 (!) 307 lb (139.3 kg)  07/05/17 (!) 305 lb (138.3 kg)    Physical Exam  Constitutional: He is oriented to person, place, and time. He appears well-developed and well-nourished. No distress.  Eyes: Conjunctivae are normal. No scleral icterus.  Neck: Neck supple. No thyromegaly present.  Cardiovascular: Normal rate, regular rhythm, normal heart sounds and intact distal pulses.  No murmur heard. Pulmonary/Chest: Effort normal and breath sounds normal. No respiratory distress. He has no wheezes.  Musculoskeletal: Normal range of motion. He exhibits no edema or tenderness (No tenderness on exam and no swelling of the right leg versus left leg.).  Lymphadenopathy:    He has no cervical adenopathy.  Neurological: He is alert and oriented to person, place, and time. Coordination normal.  Skin: Skin is warm and dry. No rash noted. He is not diaphoretic.  Psychiatric: He has a normal mood and affect. His behavior is normal.  Nursing note and vitals reviewed.  ABI left: 1.22 ABI right: 1.21     Assessment & Plan:   Problem List Items Addressed This Visit      Cardiovascular and Mediastinum   Hypertension associated with diabetes ([place])   Relevant Orders   CMP14+EGFR (Completed)   CBC with Differential/Platelet (Completed)     Endocrine   DM (diabetes mellitus) ([place]) - Primary   Relevant Orders   Bayer DCA Hb A1c Waived (Completed)   CBC with Differential/Platelet (Completed)   Vitamin B12 (Completed)   Hyperlipidemia associated with type 2 diabetes mellitus ([place])   Relevant Orders   CBC with Differential/Platelet (Completed)   Lipid panel (Completed)     Other   Morbid obesity ([place])    Other Visit Diagnoses    Vascular claudication ([place])       Relevant Orders   POCT ABI Screening for Pilot No Charge   Vitamin B12 (Completed)      Continue current medication will check labs, will do ABIs for right leg  ABIs normal but still sounds like claudication due to vascular  issue, will discuss with patient and consider vascular referral Follow up plan: Return in about 6 months (around 01/01/2019), or if symptoms worsen or fail to improve, for Diabetes and hypertension and cholesterol.  Counseling provided for all of the vaccine components Orders Placed This Encounter  Procedures  . Bayer DCA Hb A1c Waived  . CMP14+EGFR  . CBC with Differential/Platelet  . Lipid panel  . Vitamin B12  . POCT ABI Screening for Pilot No Charge    Caryl Pina, MD [facility] 07/10/2018, 7:48 AM

## 2018-07-03 ENCOUNTER — Other Ambulatory Visit: Payer: Self-pay | Admitting: Family Medicine

## 2018-07-03 LAB — CBC WITH DIFFERENTIAL/PLATELET
BASOS: 1 %
Basophils Absolute: 0.1 10*3/uL (ref 0.0–0.2)
EOS (ABSOLUTE): 0.2 10*3/uL (ref 0.0–0.4)
EOS: 3 %
HEMATOCRIT: 38.8 % (ref 37.5–51.0)
Hemoglobin: 13.3 g/dL (ref 13.0–17.7)
IMMATURE GRANULOCYTES: 0 %
Immature Grans (Abs): 0 10*3/uL (ref 0.0–0.1)
LYMPHS ABS: 2.4 10*3/uL (ref 0.7–3.1)
Lymphs: 37 %
MCH: 29.8 pg (ref 26.6–33.0)
MCHC: 34.3 g/dL (ref 31.5–35.7)
MCV: 87 fL (ref 79–97)
MONOS ABS: 0.6 10*3/uL (ref 0.1–0.9)
Monocytes: 9 %
NEUTROS PCT: 50 %
Neutrophils Absolute: 3.2 10*3/uL (ref 1.4–7.0)
PLATELETS: 304 10*3/uL (ref 150–450)
RBC: 4.46 x10E6/uL (ref 4.14–5.80)
RDW: 12.5 % (ref 12.3–15.4)
WBC: 6.6 10*3/uL (ref 3.4–10.8)

## 2018-07-03 LAB — CMP14+EGFR
A/G RATIO: 1.4 (ref 1.2–2.2)
ALK PHOS: 68 IU/L (ref 39–117)
ALT: 21 IU/L (ref 0–44)
AST: 27 IU/L (ref 0–40)
Albumin: 4.1 g/dL (ref 3.5–4.8)
BUN / CREAT RATIO: 14 (ref 10–24)
BUN: 14 mg/dL (ref 8–27)
Bilirubin Total: 0.9 mg/dL (ref 0.0–1.2)
CALCIUM: 9.4 mg/dL (ref 8.6–10.2)
CO2: 24 mmol/L (ref 20–29)
Chloride: 101 mmol/L (ref 96–106)
Creatinine, Ser: 1 mg/dL (ref 0.76–1.27)
GFR calc Af Amer: 87 mL/min/{1.73_m2} (ref >59)
GFR, EST NON AFRICAN AMERICAN: 75 mL/min/{1.73_m2} (ref >59)
GLOBULIN, TOTAL: 3 g/dL (ref 1.5–4.5)
Glucose: 122 mg/dL — ABNORMAL HIGH (ref 65–99)
Potassium: 4.4 mmol/L (ref 3.5–5.2)
SODIUM: 140 mmol/L (ref 134–144)
Total Protein: 7.1 g/dL (ref 6.0–8.5)

## 2018-07-03 LAB — LIPID PANEL
CHOL/HDL RATIO: 3.9 ratio (ref 0.0–5.0)
Cholesterol, Total: 174 mg/dL (ref 100–199)
HDL: 45 mg/dL (ref >39)
LDL Calculated: 110 mg/dL — ABNORMAL HIGH (ref 0–99)
Triglycerides: 93 mg/dL (ref 0–149)
VLDL Cholesterol Cal: 19 mg/dL (ref 5–40)

## 2018-07-03 LAB — HM DIABETES EYE EXAM

## 2018-07-03 LAB — VITAMIN B12: VITAMIN B 12: 359 pg/mL (ref 232–1245)

## 2018-07-30 ENCOUNTER — Telehealth: Payer: Self-pay | Admitting: Family Medicine

## 2018-07-30 DIAGNOSIS — I739 Peripheral vascular disease, unspecified: Secondary | ICD-10-CM

## 2018-07-30 NOTE — Telephone Encounter (Signed)
Patient came in discussing that he wants a vascular referral based on her previous conversation, will go ahead and place referral. Arville CareJoshua Dettinger, MD [facility] 07/30/2018, 9:04 AM

## 2018-08-03 ENCOUNTER — Other Ambulatory Visit: Payer: Self-pay

## 2018-09-06 ENCOUNTER — Other Ambulatory Visit: Payer: Self-pay

## 2018-09-06 ENCOUNTER — Ambulatory Visit (INDEPENDENT_AMBULATORY_CARE_PROVIDER_SITE_OTHER): Payer: Medicare Other | Admitting: Vascular Surgery

## 2018-09-06 ENCOUNTER — Encounter: Payer: Self-pay | Admitting: Vascular Surgery

## 2018-09-06 VITALS — BP 139/67 | HR 60 | Temp 97.5°F | Resp 20 | Ht 72.0 in | Wt 302.0 lb

## 2018-09-06 DIAGNOSIS — R200 Anesthesia of skin: Secondary | ICD-10-CM | POA: Diagnosis not present

## 2018-09-06 DIAGNOSIS — F17290 Nicotine dependence, other tobacco product, uncomplicated: Secondary | ICD-10-CM | POA: Diagnosis not present

## 2018-09-06 NOTE — Progress Notes (Signed)
Referring Physician: Dr Dettinger  Patient name: Brett Jensen MRN: [id number] DOB: 13-Apr-1946 Sex: male  REASON FOR CONSULT: Right leg numbness  HPI: Brett Jensen is a 73 y.o. male who complains of right leg numbness which has been continuous for about 10 months.  It has been slowly getting worse.  The patient states that he has some numbness there at all times.  However, it is made worse when he is walking.  The numbness improved somewhat by sitting still.  Is not really describe classic calf claudication.  He has had a previous back operation and had similar symptoms prior to that.  He states that currently he is not having back pain.  He has no history of nonhealing wounds.  Other medical problems include hypertension hyperlipidemia and diabetes.  He also has erectile dysfunction.  Are all currently stable.  He smokes about 2 to 3 cigars/day.  Greater than 3 minutes today spent regarding smoking cessation counseling.  Past Medical History:  Diagnosis Date  . Arthritis    bilateral knees  . Diabetes mellitus without complication ([place])   . Erectile dysfunction   . Hyperlipidemia   . Hypertension   . Restless leg syndrome   . Shingles    Past Surgical History:  Procedure Laterality Date  . JOINT REPLACEMENT Right    ankle  . SPINE SURGERY     lower back    Family History  Problem Relation Age of Onset  . Cancer Mother   . Cancer Father     SOCIAL HISTORY: Social History   Socioeconomic History  . Marital status: Widowed    Spouse name: Not on file  . Number of children: Not on file  . Years of education: Not on file  . Highest education level: Not on file  Occupational History  . Not on file  Social Needs  . Financial resource strain: Not on file  . Food insecurity:    Worry: Not on file    Inability: Not on file  . Transportation needs:    Medical: Not on file    Non-medical: Not on file  Tobacco Use  . Smoking status: Current Every Day Smoker    Types:  Cigars  . Smokeless tobacco: Never Used  Substance and Sexual Activity  . Alcohol use: No  . Drug use: No  . Sexual activity: Not on file  Lifestyle  . Physical activity:    Days per week: Not on file    Minutes per session: Not on file  . Stress: Not on file  Relationships  . Social connections:    Talks on phone: Not on file    Gets together: Not on file    Attends religious service: Not on file    Active member of club or organization: Not on file    Attends meetings of clubs or organizations: Not on file    Relationship status: Not on file  . Intimate partner violence:    Fear of current or ex partner: Not on file    Emotionally abused: Not on file    Physically abused: Not on file    Forced sexual activity: Not on file  Other Topics Concern  . Not on file  Social History Narrative  . Not on file    No Known Allergies  Current Outpatient Medications  Medication Sig Dispense Refill  . ACCU-CHEK AVIVA PLUS test strip CHECK BLOOD SUGAR ONCE A DAY OR AS DIRECTED 50 each 11  .  aspirin EC 81 MG tablet Take 81 mg by mouth daily.    Marland Kitchen atorvastatin (LIPITOR) 20 MG tablet Take 1 tablet (20 mg total) by mouth daily. 90 tablet 1  . Blood Glucose Monitoring Suppl (ACCU-CHEK AVIVA PLUS) w/Device KIT 1 Device by Does not apply route 2 (two) times daily at 10 AM and 5 PM. 1 kit 0  . clobetasol cream (TEMOVATE) 8.67 % Apply 1 application topically 2 (two) times daily. 30 g 1  . losartan-hydrochlorothiazide (HYZAAR) 100-12.5 MG tablet TAKE 1 TABLET DAILY 90 tablet 0  . pioglitazone (ACTOS) 30 MG tablet TAKE 1 TABLET DAILY 90 tablet 0  . triamcinolone cream (KENALOG) 0.1 % APPLY TO AFFECTED AREAS TWICE A DAY 30 g 1   No current facility-administered medications for this visit.     ROS:   General:  No weight loss, Fever, chills  HEENT: No recent headaches, no nasal bleeding, no visual changes, no sore throat  Neurologic: No dizziness, blackouts, seizures. No recent symptoms of  stroke or mini- stroke. No recent episodes of slurred speech, or temporary blindness.  Cardiac: No recent episodes of chest pain/pressure, no shortness of breath at rest.  + shortness of breath with exertion.  Denies history of atrial fibrillation or irregular heartbeat  Vascular: No history of rest pain in feet.  No history of claudication.  No history of non-healing ulcer, No history of DVT   Pulmonary: No home oxygen, no productive cough, no hemoptysis,  No asthma or wheezing  Musculoskeletal:  _0  Arthritis, _1  Low back pain,  _2  Joint pain  Hematologic:No history of hypercoagulable state.  No history of easy bleeding.  No history of anemia  Gastrointestinal: No hematochezia or melena,  No gastroesophageal reflux, no trouble swallowing  Urinary: _3  chronic Kidney disease, _4  on HD - _5  MWF or _6  TTHS, _7  Burning with urination, _8  Frequent urination, _9  Difficulty urinating;   Skin: No rashes  Psychological: No history of anxiety,  No history of depression   Physical Examination  Vitals:   09/06/18 1408  BP: 139/67  Pulse: 60  Resp: 20  Temp: (!) 97.5 F (36.4 C)  SpO2: 99%  Weight: (!) 302 lb (137 kg)  Height: 6' (1.829 m)    Body mass index is 40.96 kg/m.  General:  Alert and oriented, no acute distress HEENT: Normal Neck: No bruit or JVD Pulmonary: Clear to auscultation bilaterally Cardiac: Regular Rate and Rhythm without murmur Abdomen: Soft, non-tender, non-distended, no mass, obese Skin: No rash Extremity Pulses:  2+ radial, brachial, absent femoral, dorsalis pedis, posterior tibial pulses bilaterally Musculoskeletal: No deformity trace pretibial and pedal edema  Neurologic: Upper and lower extremity motor 5/5 and symmetric  DATA:  Patient had bilateral ABIs performed at [facility] on December 2.  This showed an ABI of greater than 1 bilaterally.  ASSESSMENT: Patient with right leg numbness and tingling.  Although this  does not sound like a classic claudication symptoms, he does not have easily palpable pulses in either feet.  His ABIs although greater than 1 may be due to artificial inflation secondary to calcium especially in light of his diabetes.   PLAN: We will obtain a CT angiogram with lower extremity runoff to rule out significant arterial occlusive disease.  If this is negative then further evaluation of his back as an etiology of the numbness and tingling in his leg may be warranted.  We will see the  patient back in a few weeks after his CT scan.   Ruta Hinds, MD [facility] Office: [phone] Pager: [phone]

## 2018-09-07 ENCOUNTER — Other Ambulatory Visit: Payer: Self-pay

## 2018-09-07 DIAGNOSIS — I708 Atherosclerosis of other arteries: Secondary | ICD-10-CM

## 2018-09-07 DIAGNOSIS — I700 Atherosclerosis of aorta: Secondary | ICD-10-CM

## 2018-09-07 DIAGNOSIS — R200 Anesthesia of skin: Secondary | ICD-10-CM

## 2018-09-11 ENCOUNTER — Ambulatory Visit
Admission: RE | Admit: 2018-09-11 | Discharge: 2018-09-11 | Disposition: A | Discharge status: Home / Self Care | Payer: Medicare Other | Source: Ambulatory Visit | Attending: Vascular Surgery | Admitting: Vascular Surgery

## 2018-09-11 DIAGNOSIS — I700 Atherosclerosis of aorta: Secondary | ICD-10-CM

## 2018-09-11 DIAGNOSIS — R200 Anesthesia of skin: Secondary | ICD-10-CM

## 2018-09-11 DIAGNOSIS — I708 Atherosclerosis of other arteries: Secondary | ICD-10-CM

## 2018-09-11 MED ORDER — IOPAMIDOL (ISOVUE-370) INJECTION 76%
125.0000 mL | Freq: Once | INTRAVENOUS | Status: AC | PRN
  Administered 2018-09-11: 125 mL via INTRAVENOUS

## 2018-09-13 ENCOUNTER — Encounter: Payer: Self-pay | Admitting: Vascular Surgery

## 2018-09-13 ENCOUNTER — Other Ambulatory Visit: Payer: Self-pay

## 2018-09-13 ENCOUNTER — Ambulatory Visit: Payer: Medicare Other | Admitting: Vascular Surgery

## 2018-09-13 VITALS — BP 134/71 | HR 48 | Temp 97.6°F | Resp 18 | Ht 72.0 in | Wt 304.7 lb

## 2018-09-13 DIAGNOSIS — R200 Anesthesia of skin: Secondary | ICD-10-CM

## 2018-09-13 NOTE — Progress Notes (Signed)
Patient is a 73 year old male who returns for follow-up today.  He was last seen on February 6.  His primary complaint was right leg numbness worse over the last 10 months.  At that last office visit he had normal ABIs bilaterally.  However his pulse exam was not completely normal so he was sent for a CT angiogram for further evaluation.  He states that today his symptoms are basically the same he still has right leg numbness.  Again he does not really describe calf claudication.  He does not really have symptoms in the left leg.  Review of systems: He denies shortness of breath.  He denies chest pain.  Physical exam:  Vitals:   09/13/18 1515  BP: 134/71  Pulse: (!) 48  Resp: 18  Temp: 97.6 F (36.4 C)  TempSrc: Oral  SpO2: 100%  Weight: (!) 304 lb 10.8 oz (138.2 kg)  Height: 6' (1.829 m)    Skin: Feet no ulceration no wound  Data: I reviewed the patient's CT angiogram which shows no significant aortoiliac occlusive disease.  He has mild areas of scattered plaque in the superficial femoral artery bilaterally.  Although the tibial vessels were not completely well visualized there was some scattered areas of atherosclerosis but again no significant flow-limiting stenosis.  Assessment: Patient with numbness in right leg.  He has ABIs which are normal.  He has a CT angiogram now which shows no real significant high-grade stenosis.  He does have evidence of some mild atherosclerosis.  I do not believe that his right leg numbness necessarily has a vascular etiology.  This may be more musculoskeletal or spine in origin.  I will leave further work-up of this at the discretion of his primary care physician.  Plan: The patient will follow-up with [place] on an as-needed basis if his symptoms worsen over time.  Fabienne Bruns, MD [facility] Office: [phone] Pager: [phone]

## 2018-09-27 ENCOUNTER — Ambulatory Visit: Payer: Medicare PPO | Admitting: Vascular Surgery

## 2018-10-30 ENCOUNTER — Other Ambulatory Visit: Payer: Self-pay | Admitting: *Deleted

## 2018-10-30 ENCOUNTER — Other Ambulatory Visit: Payer: Self-pay | Admitting: Family Medicine

## 2018-10-30 DIAGNOSIS — I10 Essential (primary) hypertension: Secondary | ICD-10-CM

## 2018-10-30 MED ORDER — LOSARTAN POTASSIUM-HCTZ 100-12.5 MG PO TABS: 1.0000 | ORAL_TABLET | Freq: Every day | ORAL | 0 refills | Status: DC

## 2018-12-14 ENCOUNTER — Telehealth: Payer: Self-pay | Admitting: Family Medicine

## 2018-12-20 ENCOUNTER — Ambulatory Visit (INDEPENDENT_AMBULATORY_CARE_PROVIDER_SITE_OTHER): Payer: Medicare Other | Admitting: *Deleted

## 2018-12-20 ENCOUNTER — Other Ambulatory Visit: Payer: Self-pay

## 2018-12-20 VITALS — BP 142/60 | HR 56 | Temp 97.5°F | Ht 72.0 in | Wt 309.0 lb

## 2018-12-20 DIAGNOSIS — Z0000 Encounter for general adult medical examination without abnormal findings: Secondary | ICD-10-CM | POA: Diagnosis not present

## 2018-12-20 NOTE — Progress Notes (Signed)
Subjective:   Brett Jensen is a 73 y.o. male who presents for an Initial Medicare Annual Wellness Visit. He is retired from the [employer]. He enjoys fishing and hunting in his free time. He states that he walks daily for exercise. He says that his diet is somewhat unhealthy and that he typically eats 3 meals a day. He is not currently active in church. He lives at his home and his son lives with him. He is widowed and he also has a daughter that lives in [place]. He has one grandchild and he does not have any pets. We discussed at length fall risks and hazards. He states that his health is about the same as it was a year ago.   Cardiac Risk Factors include: advanced age (>40mn, >>96women)    Objective:    Today's Vitals   12/20/18 1049 12/20/18 1108  BP: (!) 142/61 (!) 142/60  Pulse: (!) 56   Temp: (!) 97.5 F (36.4 C)   TempSrc: Oral   Weight: (!) 309 lb (140.2 kg)   Height: 6' (1.829 m)    Body mass index is 41.91 kg/m.  Advanced Directives 12/20/2018 09/06/2018 05/11/2015  Does Patient Have a Medical Advance Directive? No No No  Would patient like information on creating a medical advance directive? - No - Patient declined -    Current Medications (verified) Outpatient Encounter Medications as of 12/20/2018  Medication Sig  . ACCU-CHEK AVIVA PLUS test strip CHECK BLOOD SUGAR ONCE A DAY OR AS DIRECTED  . aspirin EC 81 MG tablet Take 81 mg by mouth daily.  .Marland Kitchenatorvastatin (LIPITOR) 20 MG tablet Take 1 tablet (20 mg total) by mouth daily.  . Blood Glucose Monitoring Suppl (ACCU-CHEK AVIVA PLUS) w/Device KIT 1 Device by Does not apply route 2 (two) times daily at 10 AM and 5 PM.  . clobetasol cream (TEMOVATE) 04.09% Apply 1 application topically 2 (two) times daily.  .Marland Kitchenlosartan-hydrochlorothiazide (HYZAAR) 100-12.5 MG tablet Take 1 tablet by mouth daily.  . pioglitazone (ACTOS) 30 MG tablet TAKE 1 TABLET DAILY  . triamcinolone cream (KENALOG) 0.1 % APPLY TO  AFFECTED AREAS TWICE A DAY (Patient not taking: Reported on 12/20/2018)   No facility-administered encounter medications on file as of 12/20/2018.     Allergies (verified) Patient has no known allergies.   History: Past Medical History:  Diagnosis Date  . Arthritis    bilateral knees  . Diabetes mellitus [place] complication ([place]   . Erectile dysfunction   . Hyperlipidemia   . Hypertension   . Restless leg syndrome   . Shingles    Past Surgical History:  Procedure Laterality Date  . JOINT REPLACEMENT Right    ankle  . SPINE SURGERY     lower back   Family History  Problem Relation Age of Onset  . Cancer Mother        leukemia  . Diabetes Mother   . Cancer Father        throat   . Cancer Sister        back   . Diabetes Sister   . Diabetes Sister   . Asthma Son   . Diabetes Brother   . Heart attack Brother   . Heart attack Brother    Social History   Socioeconomic History  . Marital status: Widowed    Spouse name: Not on file  . Number of children: 2  . Years of education: Not on  file  . Highest education level: Not on file  Occupational History  . Occupation: retired     Comment: state of [place]   Social Needs  . Financial resource strain: Not on file  . Food insecurity:    Worry: Not on file    Inability: Not on file  . Transportation needs:    Medical: Not on file    Non-medical: Not on file  Tobacco Use  . Smoking status: Current Every Day Smoker    Types: Cigars  . Smokeless tobacco: Never Used  Substance and Sexual Activity  . Alcohol use: No  . Drug use: No  . Sexual activity: Not on file  Lifestyle  . Physical activity:    Days per week: Not on file    Minutes per session: Not on file  . Stress: Not on file  Relationships  . Social connections:    Talks on phone: Not on file    Gets together: Not on file    Attends religious service: Not on file    Active member of club or organization: Not on file    Attends meetings of clubs or  organizations: Not on file    Relationship status: Not on file  Other Topics Concern  . Not on file  Social History Narrative  . Not on file   Tobacco Counseling Ready to quit: Not Answered Counseling given: Not Answered   Activities of Daily Living In your present state of health, do you have any difficulty performing the following activities: 12/20/2018  Hearing? N  Vision? N  Difficulty concentrating or making decisions? N  Walking or climbing stairs? N  Dressing or bathing? N  Doing errands, shopping? N  Preparing Food and eating ? N  Using the Toilet? N  In the past six months, have you accidently leaked urine? N  Do you have problems with loss of bowel control? N  Managing your Medications? N  Managing your Finances? N  Housekeeping or managing your Housekeeping? N  Some recent data might be hidden     Immunizations and Health Maintenance Immunization History  Administered Date(s) Administered  . Influenza, High Dose Seasonal PF 04/01/2014, 04/27/2018  . Influenza,inj,Quad PF,6+ Mos 06/24/2013  . Pneumococcal Conjugate-13 04/02/2015  . Pneumococcal Polysaccharide-23 07/05/2017  . Tdap 01/03/2018   There are no preventive care reminders to display for this patient.  Patient Care Team: Dettinger, Fransisca Kaufmann, MD as PCP - General (Family Medicine) Ralene Muskrat as Physician Assistant (Chiropractic Medicine)  Indicate any recent Medical Services you may have received from other than [facility] providers in the past year (date may be approximate).    Assessment:   This is a routine wellness examination for Brett Jensen.  Hearing/Vision screen No exam data present  Dietary issues and exercise activities discussed: Type of exercise: walking, Time (Minutes): 30, Frequency (Times/Week): 7, Weekly Exercise (Minutes/Week): 210, Intensity: Mild  Goals    . Prevent falls     Stay active       Depression Screen PHQ 2/9 Scores 12/20/2018 07/02/2018 01/03/2018 07/05/2017  PHQ - 2  Score 0 0 0 0    Fall Risk Fall Risk  12/20/2018 07/02/2018 01/03/2018 07/05/2017 01/02/2017  Falls in the past year? 0 0 No No No    Is the patient's home free of loose throw rugs in walkways, pet beds, electrical cords, etc?  Fall hazards and risks were discussed at length.  Cognitive Function:     6CIT Screen 12/20/2018  What  Year? 0 points  What month? 0 points  What time? 0 points  Count back from 20 0 points  Months in reverse 2 points  Repeat phrase 4 points  Total Score 6    Screening Tests Health Maintenance  Topic Date Due  . HEMOGLOBIN A1C  01/01/2019  . FOOT EXAM  01/04/2019  . INFLUENZA VACCINE  03/02/2019  . OPHTHALMOLOGY EXAM  07/04/2019  . COLONOSCOPY  02/15/2023  . TETANUS/TDAP  01/04/2028  . Hepatitis C Screening  Completed  . PNA vac Low Risk Adult  Completed    Qualifies for Shingles Vaccine? Yes qualifies but declined this today.  Cancer Screenings: Lung: Low Dose CT Chest recommended if Age 7-80 years, 30 pack-year currently smoking OR have quit w/in 15years. Patient does not qualify. Colorectal: due at next OV   Additional Screenings: declined  Hepatitis C Screening:       Plan:     Pt is to keep follow up with Dr Dettinger and other specialist. He is to try not to fall and is to stay active.  I have personally reviewed and noted the following in the patient's chart:   . Medical and social history . Use of alcohol, tobacco or illicit drugs  . Current medications and supplements . Functional ability and status . Nutritional status . Physical activity . Advanced directives . List of other physicians . Hospitalizations, surgeries, and ER visits in previous 12 months . Vitals . Screenings to include cognitive, depression, and falls . Referrals and appointments  In addition, I have reviewed and discussed with patient certain preventive protocols, quality metrics, and best practice recommendations. A written personalized care plan for  preventive services as well as general preventive health recommendations were provided to patient.     [place] Dec, [place]   08/16/5788

## 2018-12-20 NOTE — Patient Instructions (Signed)
  [name] , Thank you for taking time to come for your Medicare Wellness Visit. I appreciate your ongoing commitment to your health goals. Please review the following plan we discussed and let me know if I can assist you in the future.   These are the goals we discussed: Goals    . Prevent falls     Stay active        This is a list of the screening recommended for you and due dates:  Health Maintenance  Topic Date Due  . Hemoglobin A1C  01/01/2019  . Complete foot exam   01/04/2019  . Flu Shot  03/02/2019  . Eye exam for diabetics  07/04/2019  . Colon Cancer Screening  02/15/2023  . Tetanus Vaccine  01/04/2028  .  Hepatitis C: One time screening is recommended by Center for Disease Control  (CDC) for  adults born from [birth year] through 1965.   Completed  . Pneumonia vaccines  Completed    Keep follow up in June with Dr Dettinger

## 2019-01-02 ENCOUNTER — Ambulatory Visit (INDEPENDENT_AMBULATORY_CARE_PROVIDER_SITE_OTHER): Payer: Medicare Other | Admitting: Family Medicine

## 2019-01-02 ENCOUNTER — Encounter: Payer: Self-pay | Admitting: Family Medicine

## 2019-01-02 ENCOUNTER — Other Ambulatory Visit: Payer: Self-pay

## 2019-01-02 DIAGNOSIS — E1159 Type 2 diabetes mellitus with other circulatory complications: Secondary | ICD-10-CM

## 2019-01-02 DIAGNOSIS — E1169 Type 2 diabetes mellitus with other specified complication: Secondary | ICD-10-CM

## 2019-01-02 DIAGNOSIS — I10 Essential (primary) hypertension: Secondary | ICD-10-CM

## 2019-01-02 DIAGNOSIS — E785 Hyperlipidemia, unspecified: Secondary | ICD-10-CM

## 2019-01-02 DIAGNOSIS — I152 Hypertension secondary to endocrine disorders: Secondary | ICD-10-CM

## 2019-01-02 MED ORDER — LOSARTAN POTASSIUM-HCTZ 100-12.5 MG PO TABS: 1.0000 | ORAL_TABLET | Freq: Every day | ORAL | 3 refills | Status: DC

## 2019-01-02 MED ORDER — PIOGLITAZONE HCL 30 MG PO TABS: 30.0000 mg | ORAL_TABLET | Freq: Every day | ORAL | 3 refills | Status: DC

## 2019-01-02 NOTE — Progress Notes (Signed)
Virtual Visit via telephone Note  I connected with Brett Jensen on 01/02/19 at 0925 by telephone and verified that I am speaking with the correct person using two identifiers. Brett Jensen is currently located at home and no other people are currently with her during visit. The provider, Fransisca Kaufmann Dettinger, MD is located in their office at time of visit.  Call ended at 0932  I discussed the limitations, risks, security and privacy concerns of performing an evaluation and management service by telephone and the availability of in person appointments. I also discussed with the patient that there may be a patient responsible charge related to this service. The patient expressed understanding and agreed to proceed.   History and Present Illness: Type 2 diabetes mellitus Patient comes in today for recheck of his diabetes. Patient has been currently taking actos. Patient is currently on an ACE inhibitor/ARB. Patient has not seen an ophthalmologist this year. Patient denies any issues with their feet.   Hypertension Patient is currently on losartan-hydrochlorothiazide, and their blood pressure today is unknown because he does not have we will check. Patient denies any lightheadedness or dizziness. Patient denies headaches, blurred vision, chest pains, shortness of breath, or weakness. Denies any side effects from medication and is content with current medication.  Hyperlipidemia Patient is coming in for recheck of his hyperlipidemia. The patient is currently taking no medication currently because he said he did not tolerate the Lipitor.. They deny any issues with myalgias or history of liver damage from it. They deny any focal numbness or weakness or chest pain.   No diagnosis found.  Outpatient Encounter Medications as of 01/02/2019  Medication Sig  . ACCU-CHEK AVIVA PLUS test strip CHECK BLOOD SUGAR ONCE A DAY OR AS DIRECTED  . aspirin EC 81 MG tablet Take 81 mg by mouth daily.  Marland Kitchen  atorvastatin (LIPITOR) 20 MG tablet Take 1 tablet (20 mg total) by mouth daily.  . Blood Glucose Monitoring Suppl (ACCU-CHEK AVIVA PLUS) w/Device KIT 1 Device by Does not apply route 2 (two) times daily at 10 AM and 5 PM.  . clobetasol cream (TEMOVATE) 7.82 % Apply 1 application topically 2 (two) times daily.  Marland Kitchen losartan-hydrochlorothiazide (HYZAAR) 100-12.5 MG tablet Take 1 tablet by mouth daily.  . pioglitazone (ACTOS) 30 MG tablet TAKE 1 TABLET DAILY  . triamcinolone cream (KENALOG) 0.1 % APPLY TO AFFECTED AREAS TWICE A DAY (Patient not taking: Reported on 12/20/2018)   No facility-administered encounter medications on file as of 01/02/2019.     Review of Systems  Constitutional: Negative for chills and fever.  Eyes: Negative for visual disturbance.  Respiratory: Negative for shortness of breath and wheezing.   Cardiovascular: Negative for chest pain and leg swelling.  Musculoskeletal: Negative for back pain and gait problem.  Skin: Negative for rash.  Neurological: Negative for dizziness, weakness and light-headedness.  All other systems reviewed and are negative.   Observations/Objective: Patient sounds comfortable and in no acute distress  Assessment and Plan: Problem List Items Addressed This Visit      Cardiovascular and Mediastinum   Hypertension associated with diabetes ([place])   Relevant Medications   pioglitazone (ACTOS) 30 MG tablet   losartan-hydrochlorothiazide (HYZAAR) 100-12.5 MG tablet     Endocrine   DM (diabetes mellitus) ([facility]) - Primary   Relevant Medications   pioglitazone (ACTOS) 30 MG tablet   losartan-hydrochlorothiazide (HYZAAR) 100-12.5 MG tablet   Hyperlipidemia associated with type 2 diabetes mellitus ([place])   Relevant Medications  pioglitazone (ACTOS) 30 MG tablet   losartan-hydrochlorothiazide (HYZAAR) 100-12.5 MG tablet       Follow Up Instructions: Follow-up in 3 months for blood work and labs    I discussed the assessment and treatment  plan with the patient. The patient was provided an opportunity to ask questions and all were answered. The patient agreed with the plan and demonstrated an understanding of the instructions.   The patient was advised to call back or seek an in-person evaluation if the symptoms worsen or if the condition fails to improve as anticipated.  The above assessment and management plan was discussed with the patient. The patient verbalized understanding of and has agreed to the management plan. Patient is aware to call the clinic if symptoms persist or worsen. Patient is aware when to return to the clinic for a follow-up visit. Patient educated on when it is appropriate to go to the emergency department.    I provided 7 minutes of non-face-to-face time during this encounter.    Worthy Rancher, MD

## 2019-04-11 ENCOUNTER — Ambulatory Visit (INDEPENDENT_AMBULATORY_CARE_PROVIDER_SITE_OTHER): Payer: Medicare Other | Admitting: Family Medicine

## 2019-04-11 NOTE — Progress Notes (Signed)
Attempted to call the patient and left a message with no answer

## 2019-10-07 ENCOUNTER — Other Ambulatory Visit: Payer: Self-pay | Admitting: Family Medicine

## 2019-11-22 ENCOUNTER — Ambulatory Visit: Payer: Medicare Other | Admitting: Family Medicine

## 2019-11-22 ENCOUNTER — Other Ambulatory Visit: Payer: Self-pay

## 2019-11-22 ENCOUNTER — Encounter: Payer: Self-pay | Admitting: Family Medicine

## 2019-11-22 VITALS — BP 117/61 | HR 61 | Temp 97.4°F | Ht 72.0 in | Wt 307.0 lb

## 2019-11-22 DIAGNOSIS — I10 Essential (primary) hypertension: Secondary | ICD-10-CM | POA: Diagnosis not present

## 2019-11-22 DIAGNOSIS — E785 Hyperlipidemia, unspecified: Secondary | ICD-10-CM

## 2019-11-22 DIAGNOSIS — E1159 Type 2 diabetes mellitus with other circulatory complications: Secondary | ICD-10-CM

## 2019-11-22 DIAGNOSIS — E1169 Type 2 diabetes mellitus with other specified complication: Secondary | ICD-10-CM

## 2019-11-22 DIAGNOSIS — I152 Hypertension secondary to endocrine disorders: Secondary | ICD-10-CM

## 2019-11-22 LAB — BAYER DCA HB A1C WAIVED: HB A1C (BAYER DCA - WAIVED): 6.7 % (ref <7.0)

## 2019-11-22 MED ORDER — ATORVASTATIN CALCIUM 10 MG PO TABS: 10.0000 mg | ORAL_TABLET | ORAL | 3 refills | Status: DC

## 2019-11-22 MED ORDER — ATORVASTATIN CALCIUM 20 MG PO TABS: 20.0000 mg | ORAL_TABLET | Freq: Every day | ORAL | 3 refills | Status: DC

## 2019-11-22 NOTE — Progress Notes (Signed)
BP 117/61   Pulse 61   Temp (!) 97.4 F (36.3 C) (Temporal)   Ht 6' (1.829 m)   Wt (!) 307 lb (139.3 kg)   BMI 41.64 kg/m    Subjective:   Patient ID: Brett Jensen, male    DOB: 04/09/1946, 74 y.o.   MRN: [id number]  HPI: Brett Jensen is a 74 y.o. male presenting on 11/22/2019 for Medical Management of Chronic Issues   HPI Type 2 diabetes mellitus Patient comes in today for recheck of his diabetes. Patient has been currently taking Actos, he says his blood sugar is running 106 this morning and that is about where it has been running.. Patient is currently on an ACE inhibitor/ARB. Patient has not seen an ophthalmologist this year. Patient denies any issues with their feet.   Hypertension Patient is currently on losartan hydrochlorothiazide, and their blood pressure today is 117/61. Patient denies any lightheadedness or dizziness. Patient denies headaches, blurred vision, chest pains, shortness of breath, or weakness. Denies any side effects from medication and is content with current medication.   Hyperlipidemia Patient is coming in for recheck of his hyperlipidemia. The patient is currently taking no medication currently but will start atorvastatin. They deny any issues with myalgias or history of liver damage from it. They deny any focal numbness or weakness or chest pain.   Relevant past medical, surgical, family and social history reviewed and updated as indicated. Interim medical history since our last visit reviewed. Allergies and medications reviewed and updated.  Review of Systems  Constitutional: Negative for chills and fever.  Eyes: Negative for visual disturbance.  Respiratory: Negative for shortness of breath and wheezing.   Cardiovascular: Negative for chest pain and leg swelling.  Musculoskeletal: Negative for back pain and gait problem.  Skin: Negative for rash.  All other systems reviewed and are negative.   Per HPI unless specifically indicated  above   Allergies as of 11/22/2019   No Known Allergies     Medication List       Accurate as of November 22, 2019  8:26 AM. If you have any questions, ask your nurse or doctor.        Accu-Chek Aviva Plus test strip Generic drug: glucose blood CHECK BLOOD SUGAR ONCE A DAY OR AS DIRECTED   Accu-Chek Aviva Plus w/Device Kit 1 Device by Does not apply route 2 (two) times daily at 10 AM and 5 PM.   aspirin EC 81 MG tablet Take 81 mg by mouth daily.   atorvastatin 20 MG tablet Commonly known as: LIPITOR Take 1 tablet (20 mg total) by mouth daily. Started by: Fransisca Kaufmann Shaniquia Brafford, MD   clobetasol cream 0.05 % Commonly known as: TEMOVATE Apply 1 application topically 2 (two) times daily.   losartan-hydrochlorothiazide 100-12.5 MG tablet Commonly known as: HYZAAR Take 1 tablet by mouth daily.   pioglitazone 30 MG tablet Commonly known as: ACTOS Take 1 tablet (30 mg total) by mouth daily.   triamcinolone cream 0.1 % Commonly known as: KENALOG APPLY TO AFFECTED AREAS TWICE A DAY        Objective:   BP 117/61   Pulse 61   Temp (!) 97.4 F (36.3 C) (Temporal)   Ht 6' (1.829 m)   Wt (!) 307 lb (139.3 kg)   BMI 41.64 kg/m   Wt Readings from Last 3 Encounters:  11/22/19 (!) 307 lb (139.3 kg)  12/20/18 (!) 309 lb (140.2 kg)  09/13/18 (!) 304 lb 10.8  oz (138.2 kg)    Physical Exam Vitals and nursing note reviewed.  Constitutional:      General: He is not in acute distress.    Appearance: He is well-developed. He is not diaphoretic.  Eyes:     General: No scleral icterus.    Conjunctiva/sclera: Conjunctivae normal.  Neck:     Thyroid: No thyromegaly.  Cardiovascular:     Rate and Rhythm: Normal rate and regular rhythm.     Heart sounds: Normal heart sounds. No murmur.  Pulmonary:     Effort: Pulmonary effort is normal. No respiratory distress.     Breath sounds: Normal breath sounds. No wheezing.  Musculoskeletal:        General: Swelling (2+ pitting edema  bilaterally) present.     Cervical back: Neck supple.  Lymphadenopathy:     Cervical: No cervical adenopathy.  Skin:    General: Skin is warm and dry.     Findings: No rash.  Neurological:     Mental Status: He is alert and oriented to person, place, and time.     Coordination: Coordination normal.  Psychiatric:        Behavior: Behavior normal.     Assessment & Plan:   Problem List Items Addressed This Visit      Cardiovascular and Mediastinum   Hypertension associated with diabetes ([place])   Relevant Medications   atorvastatin (LIPITOR) 20 MG tablet   Other Relevant Orders   CBC with Differential/Platelet     Endocrine   DM (diabetes mellitus) ([place])   Relevant Medications   atorvastatin (LIPITOR) 20 MG tablet   Other Relevant Orders   Bayer DCA Hb A1c Waived   CBC with Differential/Platelet   CMP14+EGFR   Hyperlipidemia associated with type 2 diabetes mellitus ([place]) - Primary   Relevant Medications   atorvastatin (LIPITOR) 20 MG tablet   Other Relevant Orders   CBC with Differential/Platelet   Lipid panel     Other   Morbid obesity ([place])   Relevant Orders   CBC with Differential/Platelet      A1c is 6.7, will keep his medicine the same, no changes.  No change in current medication. Follow up plan: Return in about 3 months (around 02/21/2020), or if symptoms worsen or fail to improve, for Diabetes and hypertension.  Counseling provided for all of the vaccine components Orders Placed This Encounter  Procedures  . Bayer DCA Hb A1c Waived  . CBC with Differential/Platelet  . CMP14+EGFR  . Lipid panel    Caryl Pina, MD [facility] 11/22/2019, 8:26 AM

## 2019-11-22 NOTE — Addendum Note (Signed)
Addended by: Arville Care on: 11/22/2019 08:30 AM   Modules accepted: Orders

## 2019-11-23 LAB — CBC WITH DIFFERENTIAL/PLATELET
Basophils Absolute: 0 10*3/uL (ref 0.0–0.2)
Basos: 0 %
EOS (ABSOLUTE): 0.2 10*3/uL (ref 0.0–0.4)
Eos: 3 %
Hematocrit: 41.4 % (ref 37.5–51.0)
Hemoglobin: 13.9 g/dL (ref 13.0–17.7)
Immature Grans (Abs): 0 10*3/uL (ref 0.0–0.1)
Immature Granulocytes: 0 %
Lymphocytes Absolute: 2.6 10*3/uL (ref 0.7–3.1)
Lymphs: 37 %
MCH: 30.1 pg (ref 26.6–33.0)
MCHC: 33.6 g/dL (ref 31.5–35.7)
MCV: 90 fL (ref 79–97)
Monocytes Absolute: 0.6 10*3/uL (ref 0.1–0.9)
Monocytes: 8 %
Neutrophils Absolute: 3.7 10*3/uL (ref 1.4–7.0)
Neutrophils: 52 %
Platelets: 309 10*3/uL (ref 150–450)
RBC: 4.62 x10E6/uL (ref 4.14–5.80)
RDW: 12.9 % (ref 11.6–15.4)
WBC: 7.1 10*3/uL (ref 3.4–10.8)

## 2019-11-23 LAB — CMP14+EGFR
ALT: 21 IU/L (ref 0–44)
AST: 29 IU/L (ref 0–40)
Albumin/Globulin Ratio: 1.3 (ref 1.2–2.2)
Albumin: 4 g/dL (ref 3.7–4.7)
Alkaline Phosphatase: 70 IU/L (ref 39–117)
BUN/Creatinine Ratio: 18 (ref 10–24)
BUN: 18 mg/dL (ref 8–27)
Bilirubin Total: 0.8 mg/dL (ref 0.0–1.2)
CO2: 23 mmol/L (ref 20–29)
Calcium: 9.6 mg/dL (ref 8.6–10.2)
Chloride: 101 mmol/L (ref 96–106)
Creatinine, Ser: 0.98 mg/dL (ref 0.76–1.27)
GFR calc Af Amer: 88 mL/min/{1.73_m2} (ref >59)
GFR calc non Af Amer: 76 mL/min/{1.73_m2} (ref >59)
Globulin, Total: 3 g/dL (ref 1.5–4.5)
Glucose: 120 mg/dL — ABNORMAL HIGH (ref 65–99)
Potassium: 4.2 mmol/L (ref 3.5–5.2)
Sodium: 139 mmol/L (ref 134–144)
Total Protein: 7 g/dL (ref 6.0–8.5)

## 2019-11-23 LAB — LIPID PANEL
Chol/HDL Ratio: 3.5 ratio (ref 0.0–5.0)
Cholesterol, Total: 166 mg/dL (ref 100–199)
HDL: 47 mg/dL (ref >39)
LDL Chol Calc (NIH): 102 mg/dL — ABNORMAL HIGH (ref 0–99)
Triglycerides: 89 mg/dL (ref 0–149)
VLDL Cholesterol Cal: 17 mg/dL (ref 5–40)

## 2019-11-25 ENCOUNTER — Telehealth: Payer: Self-pay

## 2019-11-25 ENCOUNTER — Telehealth: Payer: Self-pay | Admitting: Family Medicine

## 2019-11-25 NOTE — Telephone Encounter (Signed)
error 

## 2019-11-25 NOTE — Telephone Encounter (Signed)
Pt was started on Atorvastatin on 11/22/19. He does not want to be on the medication unless he really needs to be on it. His labs are WNL. Does he need to continue taking.  Pt aware Dr. Louanne Skye is out of the office today and that we would return the phone call tomorrow.

## 2019-11-26 NOTE — Telephone Encounter (Signed)
Left message to call back  

## 2019-11-26 NOTE — Telephone Encounter (Signed)
The 10-year ASCVD risk score Denman George DC Jr., et al., 2013) is: 40.4%   Values used to calculate the score:     Age: 74 years     Sex: Male     Is Non-Hispanic African American: No     Diabetic: Yes     Tobacco smoker: Yes     Systolic Blood Pressure: 117 mmHg     Is BP treated: Yes     HDL Cholesterol: 47 mg/dL     Total Cholesterol: 166 mg/dL   I understand and it is always his decision, his cardiac risk or risk of having a heart disease or stroke is 40% in the next 10 years and atorvastatin can reduce this by one third which is a significant reduction and if he takes it even every other day it would help with this.

## 2019-11-28 NOTE — Telephone Encounter (Signed)
Spoke with pt and he agrees to stay on medication at this time

## 2019-12-23 ENCOUNTER — Telehealth: Payer: Self-pay | Admitting: Family Medicine

## 2019-12-23 DIAGNOSIS — M5416 Radiculopathy, lumbar region: Secondary | ICD-10-CM

## 2019-12-23 DIAGNOSIS — R200 Anesthesia of skin: Secondary | ICD-10-CM

## 2019-12-23 NOTE — Telephone Encounter (Signed)
  REFERRAL REQUEST Telephone Note 12/23/2019  What type of referral do you need? Right Leg Numb  Have you been seen at our office for this problem? No (Advise that they may need an appointment with their PCP before a referral can be done)  Is there a particular doctor or location that you prefer? [facility]  Patient notified that referrals can take up to a week or longer to process. If they haven't heard anything within a week they should call back and speak with the referral department.   Dr Peggye Pitt  Dr Dettinger's pt

## 2019-12-23 NOTE — Telephone Encounter (Signed)
I spoke with pt and he says he has spoken to Dr Laurian Brim and was told he didn't need a MRI just referral.

## 2019-12-23 NOTE — Telephone Encounter (Signed)
He will likely need a visit for this and we will have to probably order an MRI because I do not see an MRI of his back in the recent past, most spinal surgeons will not see you without an MRI.  Please make an appointment.  If he has an MRI in another system that we cannot see them please let us know

## 2019-12-25 ENCOUNTER — Telehealth: Payer: Self-pay | Admitting: Family Medicine

## 2019-12-25 NOTE — Telephone Encounter (Signed)
  REFERRAL REQUEST Telephone Note 12/25/2019  What type of referral do you need? Neurosurgery   Have you been seen at our office for this problem? States yes back on April 2021 (Advise that they may need an appointment with their PCP before a referral can be done)  Is there a particular doctor or location that you prefer? [facility]- Dr. Laurian Brim in [place].  Patient notified that referrals can take up to a week or longer to process. If they haven't heard anything within a week they should call back and speak with the referral department.

## 2019-12-26 ENCOUNTER — Ambulatory Visit (INDEPENDENT_AMBULATORY_CARE_PROVIDER_SITE_OTHER): Payer: Medicare PPO | Admitting: *Deleted

## 2019-12-26 VITALS — BP 120/64 | Ht 72.0 in | Wt 307.0 lb

## 2019-12-26 DIAGNOSIS — Z0000 Encounter for general adult medical examination without abnormal findings: Secondary | ICD-10-CM | POA: Diagnosis not present

## 2019-12-26 NOTE — Patient Instructions (Signed)
  Brett Jensen , Thank you for taking time to come for your Medicare Wellness Visit. I appreciate your ongoing commitment to your health goals. Please review the following plan we discussed and let me know if I can assist you in the future.   These are the goals we discussed: Goals    . Prevent falls     Stay active / walk more ( get leg better)        This is a list of the screening recommended for you and due dates:  Health Maintenance  Topic Date Due  . Eye exam for diabetics  07/04/2019  . Flu Shot  03/01/2020  . Hemoglobin A1C  05/23/2020  . Complete foot exam   11/21/2020  . Colon Cancer Screening  02/15/2023  . Tetanus Vaccine  01/04/2028  . COVID-19 Vaccine  Completed  .  Hepatitis C: One time screening is recommended by Center for Disease Control  (CDC) for  adults born from [birth year] through 1965.   Completed  . Pneumonia vaccines  Completed    Keep follow up with Dr Dettinger  Schedule an EYE EXAM soon!!

## 2019-12-26 NOTE — Progress Notes (Signed)
MEDICARE ANNUAL WELLNESS VISIT  12/26/2019  Telephone Visit Disclaimer This Medicare AWV was conducted by telephone due to national recommendations for restrictions regarding the COVID-19 Pandemic (e.g. social distancing).  I verified, using two identifiers, that I am speaking with Brett Jensen or their authorized healthcare agent. I discussed the limitations, risks, security, and privacy concerns of performing an evaluation and management service by telephone and the potential availability of an in-person appointment in the future. The patient expressed understanding and agreed to proceed.   Subjective:  Brett Jensen is a 74 y.o. male patient of Dettinger, Brett Kaufmann, MD who had a Medicare Annual Wellness Visit today via telephone. Kennett is Retired and lives with their son. he has 2 children. he reports that he is socially active and does interact with friends/family regularly. he is minimally physically active and enjoys fishing.  Patient Care Team: Dettinger, Brett Kaufmann, MD as PCP - General (Family Medicine) Ralene Muskrat as Physician Assistant (Chiropractic Medicine)  Advanced Directives 12/26/2019 12/20/2018 09/06/2018 05/11/2015  Does Patient Have a Medical Advance Directive? Yes No No No  Type of Advance Directive Living will - - -  Does patient want to make changes to medical advance directive? No - Patient declined - - -  Would patient like information on creating a medical advance directive? - - No - Patient declined -    Hospital Utilization Over the Past 12 Months: # of hospitalizations or ER visits: 0 # of surgeries: 0  Review of Systems    Patient reports that his overall health is unchanged compared to last year.  General ROS: negative  Patient Reported Readings (BP, Pulse, CBG, Weight, etc) BP 120/64 Comment: home reading  Ht 6' (1.829 m)   Wt (!) 307 lb (139.3 kg)   BMI 41.64 kg/m    Pain Assessment       Current Medications & Allergies  (verified) Allergies as of 12/26/2019   No Known Allergies     Medication List       Accurate as of Dec 26, 2019  8:52 AM. If you have any questions, ask your nurse or doctor.        Accu-Chek Aviva Plus test strip Generic drug: glucose blood CHECK BLOOD SUGAR ONCE A DAY OR AS DIRECTED   Accu-Chek Aviva Plus w/Device Kit 1 Device by Does not apply route 2 (two) times daily at 10 AM and 5 PM.   aspirin EC 81 MG tablet Take 81 mg by mouth daily.   atorvastatin 10 MG tablet Commonly known as: LIPITOR Take 1 tablet (10 mg total) by mouth every other day. What changed: when to take this   clobetasol cream 0.05 % Commonly known as: TEMOVATE Apply 1 application topically 2 (two) times daily.   losartan-hydrochlorothiazide 100-12.5 MG tablet Commonly known as: HYZAAR Take 1 tablet by mouth daily.   pioglitazone 30 MG tablet Commonly known as: ACTOS Take 1 tablet (30 mg total) by mouth daily.   triamcinolone cream 0.1 % Commonly known as: KENALOG APPLY TO AFFECTED AREAS TWICE A DAY       History (reviewed): Past Medical History:  Diagnosis Date  . Arthritis    bilateral knees  . Diabetes mellitus without complication ([place])   . Erectile dysfunction   . Hyperlipidemia   . Hypertension   . Restless leg syndrome   . Shingles    Past Surgical History:  Procedure Laterality Date  . JOINT REPLACEMENT Right    ankle  .  SPINE SURGERY     lower back   Family History  Problem Relation Age of Onset  . Cancer Mother        leukemia  . Diabetes Mother   . Cancer Father        throat   . Cancer Sister        back   . Diabetes Sister   . Diabetes Sister   . Hypertension Daughter   . Asthma Son   . Diabetes Brother   . Heart attack Brother   . Heart attack Brother    Social History   Socioeconomic History  . Marital status: Widowed    Spouse name: Not on file  . Number of children: 2  . Years of education: Not on file  . Highest education level: Not on  file  Occupational History  . Occupation: retired     Comment: state of [place]   Tobacco Use  . Smoking status: Current Every Day Smoker    Types: Cigars  . Smokeless tobacco: Never Used  Substance and Sexual Activity  . Alcohol use: No  . Drug use: No  . Sexual activity: Not on file  Other Topics Concern  . Not on file  Social History Narrative   Lives in his home = son lives with him   Social Determinants of Health   Financial Resource Strain:   . Difficulty of Paying Living Expenses:   Food Insecurity:   . Worried About Charity fundraiser in the Last Year:   . Arboriculturist in the Last Year:   Transportation Needs:   . Film/video editor (Medical):   Marland Kitchen Lack of Transportation (Non-Medical):   Physical Activity:   . Days of Exercise per Week:   . Minutes of Exercise per Session:   Stress:   . Feeling of Stress :   Social Connections:   . Frequency of Communication with Friends and Family:   . Frequency of Social Gatherings with Friends and Family:   . Attends Religious Services:   . Active Member of Clubs or Organizations:   . Attends Archivist Meetings:   Marland Kitchen Marital Status:     Activities of Daily Living In your present state of health, do you have any difficulty performing the following activities: 12/26/2019  Hearing? N  Vision? N  Difficulty concentrating or making decisions? N  Walking or climbing stairs? Y  Comment right leg numbness  Dressing or bathing? N  Doing errands, shopping? N  Preparing Food and eating ? N  Using the Toilet? N  In the past six months, have you accidently leaked urine? N  Do you have problems with loss of bowel control? N  Managing your Medications? N  Managing your Finances? N  Housekeeping or managing your Housekeeping? N  Some recent data might be hidden    Patient Education/ Literacy    Exercise Current Exercise Habits: Home exercise routine, Type of exercise: walking, Time (Minutes): 30, Frequency  (Times/Week): 2, Weekly Exercise (Minutes/Week): 60, Intensity: Mild, Exercise limited by: Other - see comments;orthopedic condition(s);neurologic condition(s)  Diet Patient reports consuming 2 meals a day and 1 snack(s) a day Patient reports that his primary diet is: Regular Patient reports that she does have regular access to food.   Depression Screen PHQ 2/9 Scores 12/26/2019 11/22/2019 12/20/2018 07/02/2018 01/03/2018 07/05/2017 01/02/2017  PHQ - 2 Score 0 0 0 0 0 0 0     Fall Risk Fall Risk  12/26/2019 11/22/2019 12/20/2018 07/02/2018 01/03/2018  Falls in the past year? 0 0 0 0 No     Objective:  Brett Jensen seemed alert and oriented and he participated appropriately during our telephone visit.  Blood Pressure Weight BMI  BP Readings from Last 3 Encounters:  12/26/19 120/64  11/22/19 117/61  12/20/18 (!) 142/60   Wt Readings from Last 3 Encounters:  12/26/19 (!) 307 lb (139.3 kg)  11/22/19 (!) 307 lb (139.3 kg)  12/20/18 (!) 309 lb (140.2 kg)   BMI Readings from Last 1 Encounters:  12/26/19 41.64 kg/m    *Unable to obtain current vital signs, weight, and BMI due to telephone visit type  Hearing/Vision  . Tayt did not seem to have difficulty with hearing/understanding during the telephone conversation . Reports that he has not had a formal eye exam by an eye care professional within the past year . Reports that he has not had a formal hearing evaluation within the past year *Unable to fully assess hearing and vision during telephone visit type  Cognitive Function: 6CIT Screen 12/26/2019 12/20/2018  What Year? 0 points 0 points  What month? 0 points 0 points  What time? 0 points 0 points  Count back from 20 0 points 0 points  Months in reverse 2 points 2 points  Repeat phrase 4 points 4 points  Total Score 6 6   (Normal:0-7, Significant for Dysfunction: >8)  Normal Cognitive Function Screening: Yes   Immunization & Health Maintenance Record Immunization History   Administered Date(s) Administered  . Influenza, High Dose Seasonal PF 04/01/2014, 04/27/2018, 04/04/2019  . Influenza,inj,Quad PF,6+ Mos 06/24/2013  . Moderna SARS-COVID-2 Vaccination 09/11/2019, 10/09/2019  . Pneumococcal Conjugate-13 04/02/2015  . Pneumococcal Polysaccharide-23 07/05/2017  . Tdap 01/03/2018    Health Maintenance  Topic Date Due  . OPHTHALMOLOGY EXAM  07/04/2019  . INFLUENZA VACCINE  03/01/2020  . HEMOGLOBIN A1C  05/23/2020  . FOOT EXAM  11/21/2020  . COLONOSCOPY  02/15/2023  . TETANUS/TDAP  01/04/2028  . COVID-19 Vaccine  Completed  . Hepatitis C Screening  Completed  . PNA vac Low Risk Adult  Completed       Assessment  This is a routine wellness examination for Brett Jensen.  Health Maintenance: Due or Overdue Health Maintenance Due  Topic Date Due  . OPHTHALMOLOGY EXAM  07/04/2019    Brett Jensen does not need a referral for Community Assistance: Care Management:   no Social Work:    no Prescription Assistance:  no Nutrition/Diabetes Education:  no   Plan:  Personalized Goals Goals Addressed            This Visit's Progress   . Prevent falls       Stay active / walk more ( get leg better)       Personalized Health Maintenance & Screening Recommendations  PSA overdue  Lung Cancer Screening Recommended: no (Low Dose CT Chest recommended if Age 68-80 years, 30 pack-year currently smoking OR have quit w/in past 15 years) Hepatitis C Screening recommended: no HIV Screening recommended: no  Advanced Directives: Written information was not prepared per patient's request.  Referrals & Orders No orders of the defined types were placed in this encounter.   Follow-up Plan . Follow-up with Dettinger, Brett Kaufmann, MD as planned    I have personally reviewed and noted the following in the patient's chart:   . Medical and social history . Use of alcohol, tobacco or illicit drugs  . Current  medications and supplements . Functional  ability and status . Nutritional status . Physical activity . Advanced directives . List of other physicians . Hospitalizations, surgeries, and ER visits in previous 12 months . Vitals . Screenings to include cognitive, depression, and falls . Referrals and appointments  In addition, I have reviewed and discussed with Brett Jensen certain preventive protocols, quality metrics, and best practice recommendations. A written personalized care plan for preventive services as well as general preventive health recommendations is available and can be mailed to the patient at his request.      [place] Dec  12/26/2019

## 2019-12-27 NOTE — Telephone Encounter (Signed)
Placed referral to Dr. Laurian Brim

## 2020-01-03 ENCOUNTER — Ambulatory Visit: Payer: Medicare PPO | Admitting: Family Medicine

## 2020-01-03 ENCOUNTER — Encounter: Payer: Self-pay | Admitting: Family Medicine

## 2020-01-03 ENCOUNTER — Other Ambulatory Visit: Payer: Self-pay

## 2020-01-03 VITALS — BP 137/66 | HR 80 | Temp 97.9°F | Ht 72.0 in | Wt 313.2 lb

## 2020-01-03 DIAGNOSIS — M545 Low back pain, unspecified: Secondary | ICD-10-CM

## 2020-01-03 DIAGNOSIS — Z9889 Other specified postprocedural states: Secondary | ICD-10-CM

## 2020-01-03 DIAGNOSIS — M5430 Sciatica, unspecified side: Secondary | ICD-10-CM

## 2020-01-03 DIAGNOSIS — M549 Dorsalgia, unspecified: Secondary | ICD-10-CM | POA: Diagnosis not present

## 2020-01-03 MED ORDER — METHYLPREDNISOLONE ACETATE 80 MG/ML IJ SUSP
80.0000 mg | Freq: Once | INTRAMUSCULAR | Status: AC
  Administered 2020-01-03: 80 mg via INTRAMUSCULAR

## 2020-01-03 NOTE — Progress Notes (Signed)
BP 137/66   Pulse 80   Temp 97.9 F (36.6 C)   Ht 6' (1.829 m)   Wt (!) 313 lb 4 oz (142.1 kg)   SpO2 96%   BMI 42.48 kg/m    Subjective:   Patient ID: Brett Jensen, male    DOB: 05-05-1946, 74 y.o.   MRN: [id number]  HPI: Brett Jensen is a 74 y.o. male presenting on 01/03/2020 for Back Pain (right sided. Radiates down right leg)   HPI Patient comes in complaining of low back pain that is on the right side that radiates down his right leg and started developing numbness down his right leg especially on the inner side of his right leg all the way down to his foot.  He says has had this for 4 or 5 years but the numbness is really picked up over the past few months.  He denies any new trauma or new changes.  He has had a history of herniated disks with surgery in the 70s.  He feels like he is starting get a pinched nerve like he did previously.  He has been doing stretches and exercises at home and has been taking oral anti-inflammatories over-the-counter.  They have not been helping and it continues to get worse and the numbness is worsening now as well.  Relevant past medical, surgical, family and social history reviewed and updated as indicated. Interim medical history since our last visit reviewed. Allergies and medications reviewed and updated.  Review of Systems  Constitutional: Negative for chills and fever.  Respiratory: Negative for shortness of breath and wheezing.   Cardiovascular: Negative for chest pain and leg swelling.  Musculoskeletal: Positive for back pain and myalgias. Negative for gait problem.  Skin: Negative for rash.  Neurological: Positive for numbness. Negative for weakness.  All other systems reviewed and are negative.   Per HPI unless specifically indicated above   Allergies as of 01/03/2020   No Known Allergies     Medication List       Accurate as of January 03, 2020  3:55 PM. If you have any questions, ask your nurse or doctor.        Accu-Chek  Aviva Plus test strip Generic drug: glucose blood CHECK BLOOD SUGAR ONCE A DAY OR AS DIRECTED   Accu-Chek Aviva Plus w/Device Kit 1 Device by Does not apply route 2 (two) times daily at 10 AM and 5 PM.   aspirin EC 81 MG tablet Take 81 mg by mouth daily.   atorvastatin 10 MG tablet Commonly known as: LIPITOR Take 1 tablet (10 mg total) by mouth every other day. What changed: when to take this   clobetasol cream 0.05 % Commonly known as: TEMOVATE Apply 1 application topically 2 (two) times daily.   losartan-hydrochlorothiazide 100-12.5 MG tablet Commonly known as: HYZAAR Take 1 tablet by mouth daily.   pioglitazone 30 MG tablet Commonly known as: ACTOS Take 1 tablet (30 mg total) by mouth daily.   triamcinolone cream 0.1 % Commonly known as: KENALOG APPLY TO AFFECTED AREAS TWICE A DAY        Objective:   BP 137/66   Pulse 80   Temp 97.9 F (36.6 C)   Ht 6' (1.829 m)   Wt (!) 313 lb 4 oz (142.1 kg)   SpO2 96%   BMI 42.48 kg/m   Wt Readings from Last 3 Encounters:  01/03/20 (!) 313 lb 4 oz (142.1 kg)  12/26/19 (!) 307 lb (  139.3 kg)  11/22/19 (!) 307 lb (139.3 kg)    Physical Exam Vitals and nursing note reviewed.  Constitutional:      General: He is not in acute distress.    Appearance: He is well-developed. He is not diaphoretic.  Eyes:     General: No scleral icterus.    Conjunctiva/sclera: Conjunctivae normal.  Neck:     Thyroid: No thyromegaly.  Musculoskeletal:     Cervical back: Neck supple.     Lumbar back: Tenderness (Right paraspinal tenderness numbness on right inner leg all the way down the leg to the foot) present. No bony tenderness. Negative right straight leg raise test and negative left straight leg raise test.  Lymphadenopathy:     Cervical: No cervical adenopathy.  Skin:    General: Skin is warm and dry.     Findings: No rash.  Neurological:     Mental Status: He is alert and oriented to person, place, and time.     Coordination:  Coordination normal.  Psychiatric:        Behavior: Behavior normal.       Assessment & Plan:   Problem List Items Addressed This Visit    None    Visit Diagnoses    Sciatic leg pain    -  Primary   Relevant Medications   methylPREDNISolone acetate (DEPO-MEDROL) injection 80 mg (Start on 01/03/2020  4:30 PM)   Other Relevant Orders   MR Lumbar Spine Wo Contrast   Lumbar pain       Relevant Medications   methylPREDNISolone acetate (DEPO-MEDROL) injection 80 mg (Start on 01/03/2020  4:30 PM)   Other Relevant Orders   MR Lumbar Spine Wo Contrast   Back pain with history of spinal surgery       Relevant Medications   methylPREDNISolone acetate (DEPO-MEDROL) injection 80 mg (Start on 01/03/2020  4:30 PM)   Other Relevant Orders   MR Lumbar Spine Wo Contrast      Instructed patient to do exercises at home and therapies and will do Depo 80 to see if we can get some improvement.  Has been going on for for 5 years but has worsened over the past 5 or 6 months. Follow up plan: Return if symptoms worsen or fail to improve.  Counseling provided for all of the vaccine components No orders of the defined types were placed in this encounter.   Caryl Pina, MD [facility] 01/03/2020, 3:55 PM

## 2020-01-14 ENCOUNTER — Telehealth: Payer: Self-pay | Admitting: *Deleted

## 2020-01-14 ENCOUNTER — Other Ambulatory Visit: Payer: Self-pay | Admitting: *Deleted

## 2020-01-14 DIAGNOSIS — E1159 Type 2 diabetes mellitus with other circulatory complications: Secondary | ICD-10-CM

## 2020-01-14 MED ORDER — PIOGLITAZONE HCL 30 MG PO TABS: 30.0000 mg | ORAL_TABLET | Freq: Every day | ORAL | 0 refills | Status: DC

## 2020-01-14 NOTE — Telephone Encounter (Signed)
Fax from [facility] Atorvastatin 10 mg 1 QOD Pt says directions changed to QD Please verify and write new Rx for #90 if appropriate

## 2020-01-15 MED ORDER — ATORVASTATIN CALCIUM 10 MG PO TABS: 10.0000 mg | ORAL_TABLET | Freq: Every day | ORAL | 3 refills | Status: DC

## 2020-01-15 NOTE — Addendum Note (Signed)
Addended by: Arville Care on: 01/15/2020 07:54 AM   Modules accepted: Orders

## 2020-01-15 NOTE — Telephone Encounter (Signed)
Sent new prescription of Lipitor daily for patient

## 2020-01-15 NOTE — Telephone Encounter (Signed)
Patient aware, script is ready. 

## 2020-01-16 ENCOUNTER — Other Ambulatory Visit: Payer: Self-pay

## 2020-01-16 DIAGNOSIS — E1159 Type 2 diabetes mellitus with other circulatory complications: Secondary | ICD-10-CM

## 2020-01-16 MED ORDER — LOSARTAN POTASSIUM-HCTZ 100-12.5 MG PO TABS: 1.0000 | ORAL_TABLET | Freq: Every day | ORAL | 0 refills | Status: DC

## 2020-01-19 ENCOUNTER — Ambulatory Visit (HOSPITAL_COMMUNITY): Payer: Medicare Other

## 2020-01-22 ENCOUNTER — Other Ambulatory Visit: Payer: Self-pay

## 2020-01-22 ENCOUNTER — Ambulatory Visit (HOSPITAL_COMMUNITY)
Admission: RE | Admit: 2020-01-22 | Discharge: 2020-01-22 | Disposition: A | Discharge status: Home / Self Care | Payer: Medicare PPO | Source: Ambulatory Visit | Attending: Family Medicine | Admitting: Family Medicine

## 2020-01-22 DIAGNOSIS — M549 Dorsalgia, unspecified: Secondary | ICD-10-CM | POA: Insufficient documentation

## 2020-01-22 DIAGNOSIS — M545 Low back pain, unspecified: Secondary | ICD-10-CM

## 2020-01-22 DIAGNOSIS — M5430 Sciatica, unspecified side: Secondary | ICD-10-CM | POA: Diagnosis not present

## 2020-01-31 NOTE — Progress Notes (Signed)
Left message to please call our office. 

## 2020-02-13 ENCOUNTER — Ambulatory Visit: Payer: Medicare PPO | Admitting: Family Medicine

## 2020-02-13 ENCOUNTER — Other Ambulatory Visit: Payer: Self-pay

## 2020-02-13 ENCOUNTER — Encounter: Payer: Self-pay | Admitting: Family Medicine

## 2020-02-13 VITALS — BP 125/65 | HR 78 | Temp 98.6°F | Ht 72.0 in | Wt 302.0 lb

## 2020-02-13 DIAGNOSIS — E1159 Type 2 diabetes mellitus with other circulatory complications: Secondary | ICD-10-CM | POA: Diagnosis not present

## 2020-02-13 DIAGNOSIS — I10 Essential (primary) hypertension: Secondary | ICD-10-CM

## 2020-02-13 DIAGNOSIS — E1169 Type 2 diabetes mellitus with other specified complication: Secondary | ICD-10-CM

## 2020-02-13 DIAGNOSIS — E785 Hyperlipidemia, unspecified: Secondary | ICD-10-CM | POA: Diagnosis not present

## 2020-02-13 DIAGNOSIS — I152 Hypertension secondary to endocrine disorders: Secondary | ICD-10-CM

## 2020-02-13 LAB — BAYER DCA HB A1C WAIVED: HB A1C (BAYER DCA - WAIVED): 6.5 % (ref <7.0)

## 2020-02-13 NOTE — Progress Notes (Signed)
BP 125/65   Pulse 78   Temp 98.6 F (37 C)   Ht 6' (1.829 m)   Wt (!) 302 lb (137 kg)   SpO2 95%   BMI 40.96 kg/m    Subjective:   Patient ID: Brett Jensen, male    DOB: 1946-07-07, 74 y.o.   MRN: [id number]  HPI: Brett Jensen is a 74 y.o. male presenting on 02/13/2020 for Medical Management of Chronic Issues, Leg Pain (right), and Back Pain (right)   HPI Type 2 diabetes mellitus Patient comes in today for recheck of his diabetes. Patient has been currently taking Actos, will check A1c today. Patient is currently on an ACE inhibitor/ARB. Patient has not seen an ophthalmologist this year. Patient denies any issues with their feet. The symptom started onset as an adult hypertension hyperlipidemia morbid obesity ARE RELATED TO DM   Hypertension Patient is currently on losartan hydrochlorothiazide, and their blood pressure today is 125/65. Patient denies any lightheadedness or dizziness. Patient denies headaches, blurred vision, chest pains, shortness of breath, or weakness. Denies any side effects from medication and is content with current medication.   Hyperlipidemia Patient is coming in for recheck of his hyperlipidemia. The patient is currently taking atorvastatin. They deny any issues with myalgias or history of liver damage from it. They deny any focal numbness or weakness or chest pain.   Patient's hypertension and diabetes and hyperlipidemia are more complicated by the patient's morbid obesity.  Discussed weight loss and lifestyle modification and exercise with the patient.   Relevant past medical, surgical, family and social history reviewed and updated as indicated. Interim medical history since our last visit reviewed. Allergies and medications reviewed and updated.  Review of Systems  Constitutional: Negative for chills and fever.  Eyes: Negative for discharge.  Respiratory: Negative for shortness of breath and wheezing.   Cardiovascular: Negative for chest pain and  leg swelling.  Musculoskeletal: Negative for back pain and gait problem.  Skin: Negative for rash.  Neurological: Negative for dizziness, weakness and numbness.  All other systems reviewed and are negative.   Per HPI unless specifically indicated above   Allergies as of 02/13/2020   No Known Allergies     Medication List       Accurate as of February 13, 2020  2:35 PM. If you have any questions, ask your nurse or doctor.        STOP taking these medications   amoxicillin 500 MG capsule Commonly known as: AMOXIL Stopped by: Worthy Rancher, MD   HYDROcodone-acetaminophen 10-325 MG tablet Commonly known as: NORCO Stopped by: Fransisca Kaufmann Ingram Onnen, MD     TAKE these medications   Accu-Chek Aviva Plus test strip Generic drug: glucose blood CHECK BLOOD SUGAR ONCE A DAY OR AS DIRECTED   Accu-Chek Aviva Plus w/Device Kit 1 Device by Does not apply route 2 (two) times daily at 10 AM and 5 PM.   aspirin EC 81 MG tablet Take 81 mg by mouth daily.   atorvastatin 10 MG tablet Commonly known as: LIPITOR Take 1 tablet (10 mg total) by mouth daily.   clobetasol cream 0.05 % Commonly known as: TEMOVATE Apply 1 application topically 2 (two) times daily.   losartan-hydrochlorothiazide 100-12.5 MG tablet Commonly known as: HYZAAR Take 1 tablet by mouth daily.   pioglitazone 30 MG tablet Commonly known as: ACTOS Take 1 tablet (30 mg total) by mouth daily.   triamcinolone cream 0.1 % Commonly known as: KENALOG APPLY TO  AFFECTED AREAS TWICE A DAY        Objective:   BP 125/65   Pulse 78   Temp 98.6 F (37 C)   Ht 6' (1.829 m)   Wt (!) 302 lb (137 kg)   SpO2 95%   BMI 40.96 kg/m   Wt Readings from Last 3 Encounters:  02/13/20 (!) 302 lb (137 kg)  01/03/20 (!) 313 lb 4 oz (142.1 kg)  12/26/19 (!) 307 lb (139.3 kg)    Physical Exam Vitals and nursing note reviewed.  Constitutional:      General: He is not in acute distress.    Appearance: He is well-developed.  He is not diaphoretic.  Eyes:     General: No scleral icterus.    Conjunctiva/sclera: Conjunctivae normal.  Neck:     Thyroid: No thyromegaly.  Cardiovascular:     Rate and Rhythm: Normal rate and regular rhythm.     Heart sounds: Normal heart sounds. No murmur heard.   Pulmonary:     Effort: Pulmonary effort is normal. No respiratory distress.     Breath sounds: Normal breath sounds. No wheezing.  Musculoskeletal:        General: Normal range of motion.     Cervical back: Neck supple.  Lymphadenopathy:     Cervical: No cervical adenopathy.  Skin:    General: Skin is warm and dry.     Findings: No rash.  Neurological:     Mental Status: He is alert and oriented to person, place, and time.     Coordination: Coordination normal.  Psychiatric:        Behavior: Behavior normal.       Assessment & Plan:   Problem List Items Addressed This Visit      Cardiovascular and Mediastinum   Hypertension associated with diabetes ([place])     Endocrine   DM (diabetes mellitus) ([place]) - Primary   Relevant Orders   Bayer DCA Hb A1c Waived   Hyperlipidemia associated with type 2 diabetes mellitus ([place])     Other   Morbid obesity ([place])      Patient see spinal surgeon next week, steroid injection did not do any good for his back. Follow up plan: No follow-ups on file.  Counseling provided for all of the vaccine components No orders of the defined types were placed in this encounter.   Caryl Pina, MD [facility] 02/13/2020, 2:35 PM

## 2020-02-20 DIAGNOSIS — M545 Low back pain: Secondary | ICD-10-CM | POA: Diagnosis not present

## 2020-02-20 DIAGNOSIS — G894 Chronic pain syndrome: Secondary | ICD-10-CM | POA: Diagnosis not present

## 2020-02-20 DIAGNOSIS — G8929 Other chronic pain: Secondary | ICD-10-CM | POA: Diagnosis not present

## 2020-02-20 DIAGNOSIS — M48061 Spinal stenosis, lumbar region without neurogenic claudication: Secondary | ICD-10-CM | POA: Diagnosis not present

## 2020-02-20 DIAGNOSIS — M5136 Other intervertebral disc degeneration, lumbar region: Secondary | ICD-10-CM | POA: Diagnosis not present

## 2020-02-20 DIAGNOSIS — M4726 Other spondylosis with radiculopathy, lumbar region: Secondary | ICD-10-CM | POA: Diagnosis not present

## 2020-02-20 DIAGNOSIS — M7918 Myalgia, other site: Secondary | ICD-10-CM | POA: Diagnosis not present

## 2020-03-11 ENCOUNTER — Other Ambulatory Visit: Payer: Self-pay

## 2020-03-17 DIAGNOSIS — M5136 Other intervertebral disc degeneration, lumbar region: Secondary | ICD-10-CM | POA: Diagnosis not present

## 2020-03-17 DIAGNOSIS — M5416 Radiculopathy, lumbar region: Secondary | ICD-10-CM | POA: Diagnosis not present

## 2020-03-17 DIAGNOSIS — M48062 Spinal stenosis, lumbar region with neurogenic claudication: Secondary | ICD-10-CM | POA: Diagnosis not present

## 2020-03-17 DIAGNOSIS — M47816 Spondylosis without myelopathy or radiculopathy, lumbar region: Secondary | ICD-10-CM | POA: Diagnosis not present

## 2020-03-26 DIAGNOSIS — Z01818 Encounter for other preprocedural examination: Secondary | ICD-10-CM | POA: Diagnosis not present

## 2020-03-26 DIAGNOSIS — I4510 Unspecified right bundle-branch block: Secondary | ICD-10-CM | POA: Diagnosis not present

## 2020-03-26 DIAGNOSIS — E119 Type 2 diabetes mellitus without complications: Secondary | ICD-10-CM | POA: Diagnosis not present

## 2020-03-26 DIAGNOSIS — Z01810 Encounter for preprocedural cardiovascular examination: Secondary | ICD-10-CM | POA: Diagnosis not present

## 2020-03-26 DIAGNOSIS — R9431 Abnormal electrocardiogram [ECG] [EKG]: Secondary | ICD-10-CM | POA: Diagnosis not present

## 2020-03-26 DIAGNOSIS — Z01812 Encounter for preprocedural laboratory examination: Secondary | ICD-10-CM | POA: Diagnosis not present

## 2020-03-26 DIAGNOSIS — M5416 Radiculopathy, lumbar region: Secondary | ICD-10-CM | POA: Diagnosis not present

## 2020-04-02 DIAGNOSIS — M4726 Other spondylosis with radiculopathy, lumbar region: Secondary | ICD-10-CM | POA: Diagnosis not present

## 2020-04-02 DIAGNOSIS — I10 Essential (primary) hypertension: Secondary | ICD-10-CM | POA: Diagnosis not present

## 2020-04-02 DIAGNOSIS — Z79899 Other long term (current) drug therapy: Secondary | ICD-10-CM | POA: Diagnosis not present

## 2020-04-02 DIAGNOSIS — Z7982 Long term (current) use of aspirin: Secondary | ICD-10-CM | POA: Diagnosis not present

## 2020-04-02 DIAGNOSIS — M5416 Radiculopathy, lumbar region: Secondary | ICD-10-CM | POA: Insufficient documentation

## 2020-04-02 DIAGNOSIS — M48062 Spinal stenosis, lumbar region with neurogenic claudication: Secondary | ICD-10-CM | POA: Diagnosis not present

## 2020-04-02 DIAGNOSIS — M47816 Spondylosis without myelopathy or radiculopathy, lumbar region: Secondary | ICD-10-CM | POA: Diagnosis not present

## 2020-04-02 DIAGNOSIS — Z6841 Body Mass Index (BMI) 40.0 and over, adult: Secondary | ICD-10-CM | POA: Diagnosis not present

## 2020-04-02 DIAGNOSIS — E119 Type 2 diabetes mellitus without complications: Secondary | ICD-10-CM | POA: Diagnosis not present

## 2020-04-02 DIAGNOSIS — F17200 Nicotine dependence, unspecified, uncomplicated: Secondary | ICD-10-CM | POA: Diagnosis not present

## 2020-04-02 DIAGNOSIS — M438X6 Other specified deforming dorsopathies, lumbar region: Secondary | ICD-10-CM | POA: Diagnosis not present

## 2020-04-02 DIAGNOSIS — E785 Hyperlipidemia, unspecified: Secondary | ICD-10-CM | POA: Diagnosis not present

## 2020-04-02 DIAGNOSIS — G4730 Sleep apnea, unspecified: Secondary | ICD-10-CM | POA: Diagnosis not present

## 2020-04-15 ENCOUNTER — Ambulatory Visit: Payer: Medicare PPO | Admitting: Family Medicine

## 2020-04-22 ENCOUNTER — Other Ambulatory Visit: Payer: Self-pay | Admitting: Family Medicine

## 2020-04-22 DIAGNOSIS — E1159 Type 2 diabetes mellitus with other circulatory complications: Secondary | ICD-10-CM

## 2020-05-14 ENCOUNTER — Ambulatory Visit: Payer: Medicare PPO | Admitting: Family Medicine

## 2020-05-20 ENCOUNTER — Encounter: Payer: Self-pay | Admitting: Family Medicine

## 2020-05-20 ENCOUNTER — Ambulatory Visit: Payer: Medicare PPO | Admitting: Family Medicine

## 2020-05-20 ENCOUNTER — Other Ambulatory Visit: Payer: Self-pay

## 2020-05-20 VITALS — BP 122/68 | HR 54 | Temp 97.0°F | Ht 72.0 in | Wt 311.0 lb

## 2020-05-20 DIAGNOSIS — I152 Hypertension secondary to endocrine disorders: Secondary | ICD-10-CM | POA: Diagnosis not present

## 2020-05-20 DIAGNOSIS — E785 Hyperlipidemia, unspecified: Secondary | ICD-10-CM | POA: Diagnosis not present

## 2020-05-20 DIAGNOSIS — E1159 Type 2 diabetes mellitus with other circulatory complications: Secondary | ICD-10-CM | POA: Diagnosis not present

## 2020-05-20 DIAGNOSIS — E1169 Type 2 diabetes mellitus with other specified complication: Secondary | ICD-10-CM

## 2020-05-20 LAB — BAYER DCA HB A1C WAIVED: HB A1C (BAYER DCA - WAIVED): 6 % (ref <7.0)

## 2020-05-20 MED ORDER — BACLOFEN 10 MG PO TABS: 10.0000 mg | ORAL_TABLET | Freq: Every evening | ORAL | 1 refills | Status: DC | PRN

## 2020-05-20 NOTE — Progress Notes (Signed)
BP 122/68   Pulse (!) 54   Temp (!) 97 F (36.1 C)   Ht 6' (1.829 m)   Wt (!) 311 lb (141.1 kg)   SpO2 99%   BMI 42.18 kg/m    Subjective:   Patient ID: Brett Jensen, male    DOB: 05/11/1946, 74 y.o.   MRN: [id number]  HPI: CAELLUM MANCIL is a 74 y.o. male presenting on 05/20/2020 for Medical Management of Chronic Issues and Diabetes   HPI Type 2 diabetes mellitus Patient comes in today for recheck of his diabetes. Patient has been currently taking Actos. Patient is currently on an ACE inhibitor/ARB. Patient has not seen an ophthalmologist this year. Patient denies any issues with their feet. The symptom started onset as an adult hyperlipidemia and hypertension ARE RELATED TO DM   Hyperlipidemia Patient is coming in for recheck of his hyperlipidemia. The patient is currently taking atorvastatin. They deny any issues with myalgias or history of liver damage from it. They deny any focal numbness or weakness or chest pain.   Hypertension Patient is currently on losartan hydrochlorothiazide, and their blood pressure today is 122/68. Patient denies any lightheadedness or dizziness. Patient denies headaches, blurred vision, chest pains, shortness of breath, or weakness. Denies any side effects from medication and is content with current medication.   Relevant past medical, surgical, family and social history reviewed and updated as indicated. Interim medical history since our last visit reviewed. Allergies and medications reviewed and updated.  Review of Systems  Constitutional: Negative for chills and fever.  Respiratory: Negative for shortness of breath and wheezing.   Cardiovascular: Negative for chest pain and leg swelling.  Musculoskeletal: Negative for back pain and gait problem.  Skin: Negative for rash.  Neurological: Negative for dizziness, weakness and numbness.  All other systems reviewed and are negative.   Per HPI unless specifically indicated above   Allergies as  of 05/20/2020   No Known Allergies     Medication List       Accurate as of May 20, 2020  8:54 AM. If you have any questions, ask your nurse or doctor.        STOP taking these medications   clobetasol cream 0.05 % Commonly known as: TEMOVATE Stopped by: Fransisca Kaufmann Channell Quattrone, MD   triamcinolone cream 0.1 % Commonly known as: KENALOG Stopped by: Fransisca Kaufmann Nashly Olsson, MD     TAKE these medications   Accu-Chek Aviva Plus test strip Generic drug: glucose blood CHECK BLOOD SUGAR ONCE A DAY OR AS DIRECTED   Accu-Chek Aviva Plus w/Device Kit 1 Device by Does not apply route 2 (two) times daily at 10 AM and 5 PM.   aspirin EC 81 MG tablet Take 81 mg by mouth daily.   atorvastatin 10 MG tablet Commonly known as: LIPITOR Take 1 tablet (10 mg total) by mouth daily.   baclofen 10 MG tablet Commonly known as: LIORESAL Take 1 tablet (10 mg total) by mouth at bedtime as needed for muscle spasms. Started by: Worthy Rancher, MD   losartan-hydrochlorothiazide 100-12.5 MG tablet Commonly known as: HYZAAR Take 1 tablet by mouth daily.   pioglitazone 30 MG tablet Commonly known as: ACTOS Take 1 tablet (30 mg total) by mouth daily.        Objective:   BP 122/68   Pulse (!) 54   Temp (!) 97 F (36.1 C)   Ht 6' (1.829 m)   Wt (!) 311 lb (141.1 kg)  SpO2 99%   BMI 42.18 kg/m   Wt Readings from Last 3 Encounters:  05/20/20 (!) 311 lb (141.1 kg)  02/13/20 (!) 302 lb (137 kg)  01/03/20 (!) 313 lb 4 oz (142.1 kg)    Physical Exam Vitals and nursing note reviewed.  Constitutional:      General: He is not in acute distress.    Appearance: He is well-developed. He is not diaphoretic.  Eyes:     General: No scleral icterus.    Conjunctiva/sclera: Conjunctivae normal.  Neck:     Thyroid: No thyromegaly.  Cardiovascular:     Rate and Rhythm: Normal rate and regular rhythm.     Heart sounds: Normal heart sounds. No murmur heard.   Pulmonary:     Effort: Pulmonary  effort is normal. No respiratory distress.     Breath sounds: Normal breath sounds. No wheezing.  Musculoskeletal:        General: Normal range of motion.     Cervical back: Neck supple.  Lymphadenopathy:     Cervical: No cervical adenopathy.  Skin:    General: Skin is warm and dry.     Findings: No rash.  Neurological:     Mental Status: He is alert and oriented to person, place, and time.     Coordination: Coordination normal.  Psychiatric:        Behavior: Behavior normal.     A1c 6.0  Assessment & Plan:   Problem List Items Addressed This Visit      Cardiovascular and Mediastinum   Hypertension associated with diabetes ([place])   Relevant Orders   CMP14+EGFR     Endocrine   DM (diabetes mellitus) ([place]) - Primary   Relevant Orders   Bayer DCA Hb A1c Waived   CBC with Differential/Platelet   Hyperlipidemia associated with type 2 diabetes mellitus ([place])   Relevant Orders   Lipid panel     Other   Morbid obesity ([place])      Continue current medication, no changes, we discussed possibly cutting back on the Actos but he does not want to for now.  Patient does have some occasional muscle cramps, will send a muscle relaxer for that. Follow up plan: Return in about 3 months (around 08/20/2020), or if symptoms worsen or fail to improve, for Diabetes recheck.  Counseling provided for all of the vaccine components Orders Placed This Encounter  Procedures  . Bayer DCA Hb A1c Waived  . CBC with Differential/Platelet  . CMP14+EGFR  . Lipid panel    Caryl Pina, MD [facility] 05/20/2020, 8:54 AM

## 2020-05-21 LAB — CMP14+EGFR
ALT: 17 IU/L (ref 0–44)
AST: 16 IU/L (ref 0–40)
Albumin/Globulin Ratio: 1.4 (ref 1.2–2.2)
Albumin: 3.8 g/dL (ref 3.7–4.7)
Alkaline Phosphatase: 64 IU/L (ref 44–121)
BUN/Creatinine Ratio: 18 (ref 10–24)
BUN: 22 mg/dL (ref 8–27)
Bilirubin Total: 0.8 mg/dL (ref 0.0–1.2)
CO2: 24 mmol/L (ref 20–29)
Calcium: 9.2 mg/dL (ref 8.6–10.2)
Chloride: 102 mmol/L (ref 96–106)
Creatinine, Ser: 1.21 mg/dL (ref 0.76–1.27)
GFR calc Af Amer: 68 mL/min/{1.73_m2} (ref >59)
GFR calc non Af Amer: 59 mL/min/{1.73_m2} — ABNORMAL LOW (ref >59)
Globulin, Total: 2.7 g/dL (ref 1.5–4.5)
Glucose: 108 mg/dL — ABNORMAL HIGH (ref 65–99)
Potassium: 4.6 mmol/L (ref 3.5–5.2)
Sodium: 139 mmol/L (ref 134–144)
Total Protein: 6.5 g/dL (ref 6.0–8.5)

## 2020-05-21 LAB — CBC WITH DIFFERENTIAL/PLATELET
Basophils Absolute: 0 10*3/uL (ref 0.0–0.2)
Basos: 1 %
EOS (ABSOLUTE): 0.2 10*3/uL (ref 0.0–0.4)
Eos: 3 %
Hematocrit: 36.5 % — ABNORMAL LOW (ref 37.5–51.0)
Hemoglobin: 12.1 g/dL — ABNORMAL LOW (ref 13.0–17.7)
Immature Grans (Abs): 0 10*3/uL (ref 0.0–0.1)
Immature Granulocytes: 0 %
Lymphocytes Absolute: 2.4 10*3/uL (ref 0.7–3.1)
Lymphs: 38 %
MCH: 30.3 pg (ref 26.6–33.0)
MCHC: 33.2 g/dL (ref 31.5–35.7)
MCV: 91 fL (ref 79–97)
Monocytes Absolute: 0.6 10*3/uL (ref 0.1–0.9)
Monocytes: 9 %
Neutrophils Absolute: 3.1 10*3/uL (ref 1.4–7.0)
Neutrophils: 49 %
Platelets: 253 10*3/uL (ref 150–450)
RBC: 4 x10E6/uL — ABNORMAL LOW (ref 4.14–5.80)
RDW: 12.6 % (ref 11.6–15.4)
WBC: 6.4 10*3/uL (ref 3.4–10.8)

## 2020-05-21 LAB — LIPID PANEL
Chol/HDL Ratio: 3.6 ratio (ref 0.0–5.0)
Cholesterol, Total: 143 mg/dL (ref 100–199)
HDL: 40 mg/dL (ref >39)
LDL Chol Calc (NIH): 82 mg/dL (ref 0–99)
Triglycerides: 114 mg/dL (ref 0–149)
VLDL Cholesterol Cal: 21 mg/dL (ref 5–40)

## 2020-05-22 ENCOUNTER — Ambulatory Visit: Payer: Medicare PPO | Admitting: Family Medicine

## 2020-06-01 ENCOUNTER — Other Ambulatory Visit: Payer: Self-pay | Admitting: Family Medicine

## 2020-07-30 ENCOUNTER — Other Ambulatory Visit: Payer: Self-pay | Admitting: Family Medicine

## 2020-07-30 DIAGNOSIS — E1159 Type 2 diabetes mellitus with other circulatory complications: Secondary | ICD-10-CM

## 2020-07-30 DIAGNOSIS — I152 Hypertension secondary to endocrine disorders: Secondary | ICD-10-CM

## 2020-09-11 ENCOUNTER — Encounter: Payer: Self-pay | Admitting: *Deleted

## 2020-10-31 IMAGING — MR MR LUMBAR SPINE W/O CM
4 of 5 series · 26 of 48 positions shown · non-contrast
Comparison: None.

CLINICAL DATA: History of lumbar spine surgery in the 8083's. Low
back pain on the right radiating into the right leg with right leg
numbness.

EXAM:
MRI LUMBAR SPINE WITHOUT CONTRAST
TECHNIQUE: Multiplanar, multisequence MR imaging of the lumbar spine was
performed. No intravenous contrast was administered.

[Series 5: T2 · sagittal · 4.0mm · 0.73mm/px · 6 of 15 slices shown (1 of 2)]
[im 1/15]
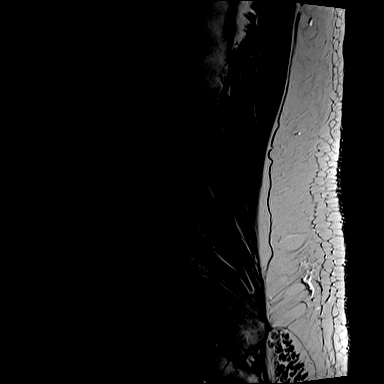
[im 3/15]
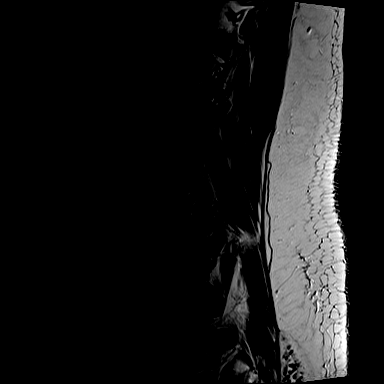
[im 6/15]
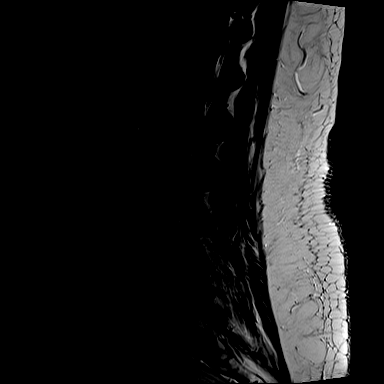
[im 9/15]
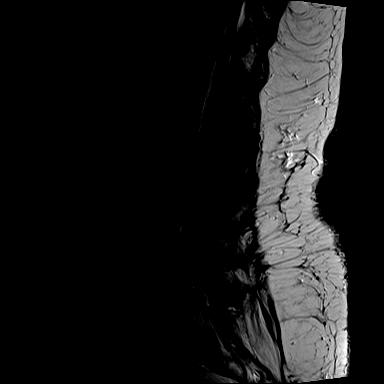
[im 12/15]
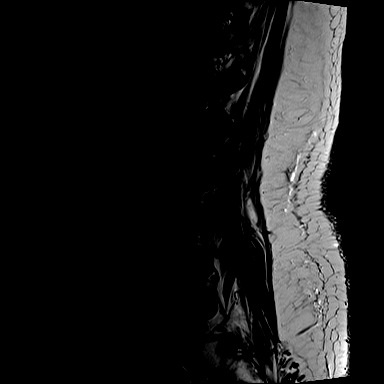
[im 15/15]
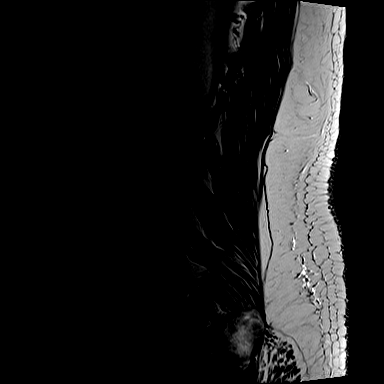

[Series 7: T1 · sagittal · 4.0mm · 0.88mm/px · 7 of 15 slices shown (1 of 2)]
[im 1/15]
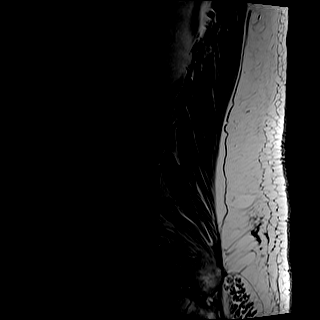
[im 3/15]
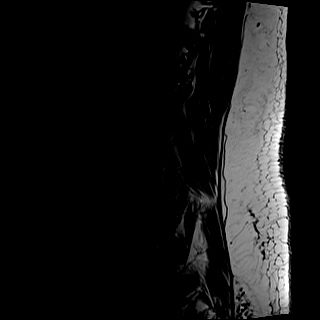
[im 5/15]
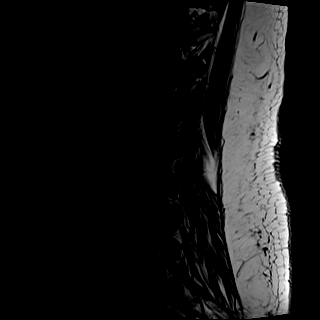
[im 8/15]
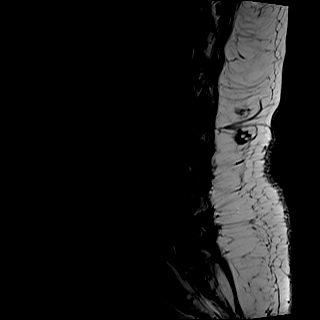
[im 10/15]
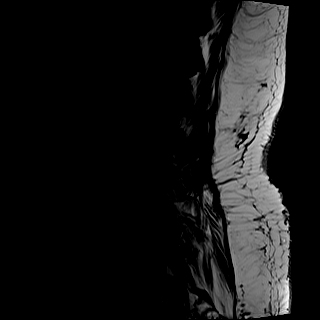
[im 12/15]
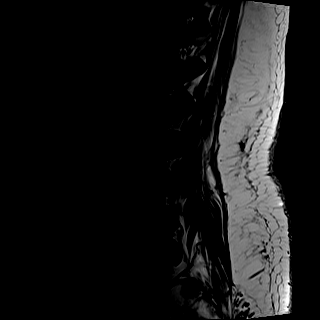
[im 15/15]
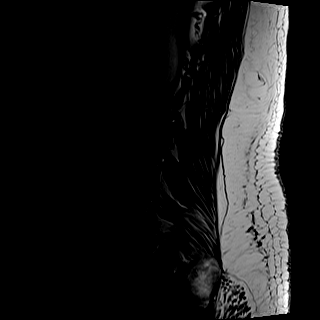

[Series 8: T2 · axial · 4.0mm · 0.57mm/px · z∈[-91,+98]mm · 8 of 32 slices shown (2 of 2)]
[im 1/32]
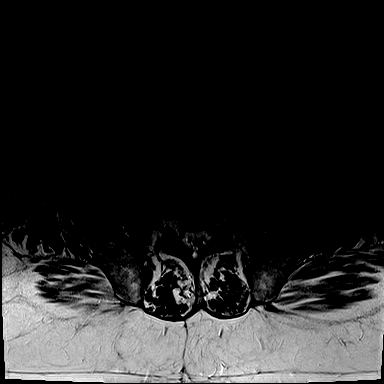
[im 5/32]
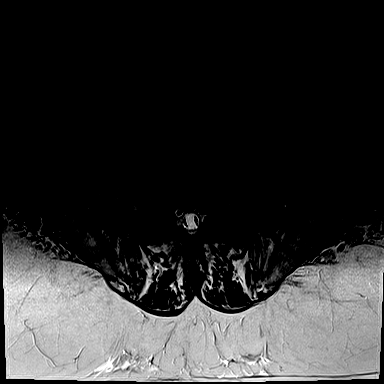
[im 10/32]
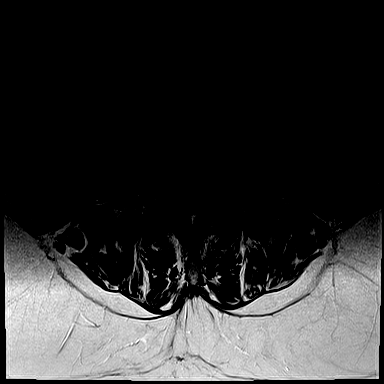
[im 15/32]
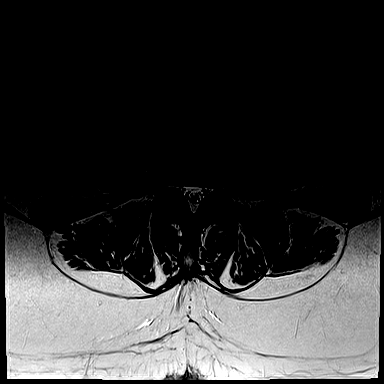
[im 17/32]
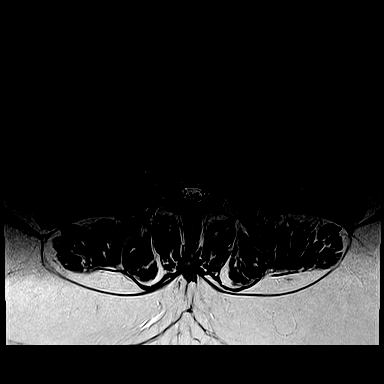
[im 22/32]
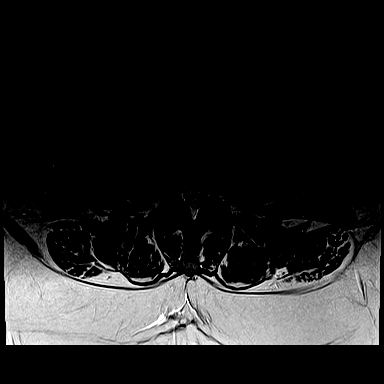
[im 27/32]
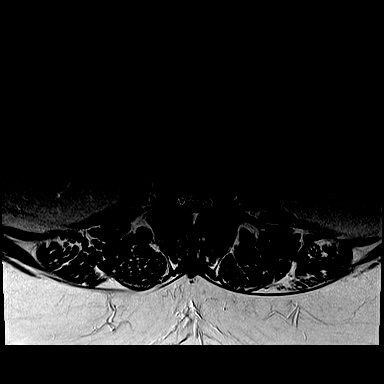
[im 32/32]
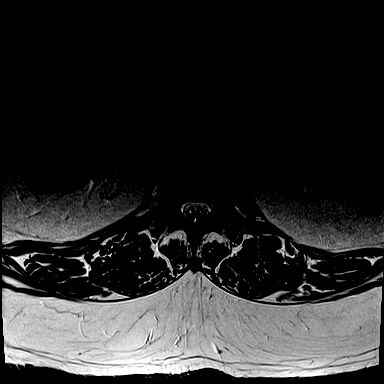

[Series 9: T1 · axial · 4.0mm · 0.34mm/px · z∈[-91,+73]mm · 5 of 32 slices shown (2 of 2)]
[im 1/32]
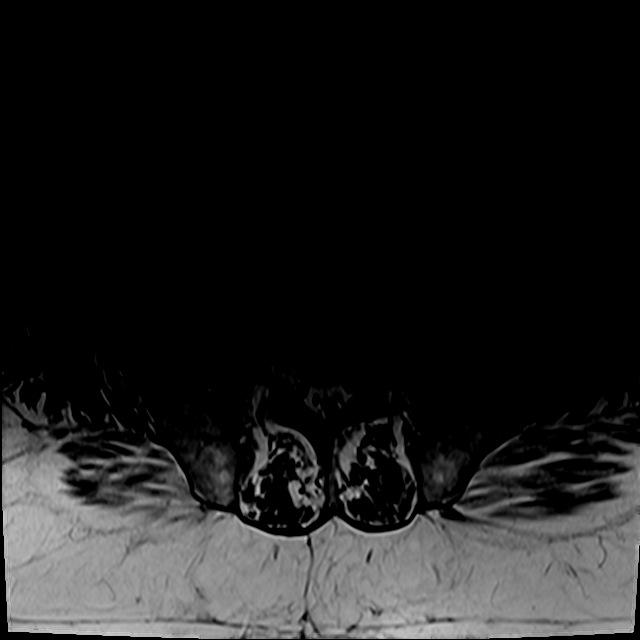
[im 5/32]
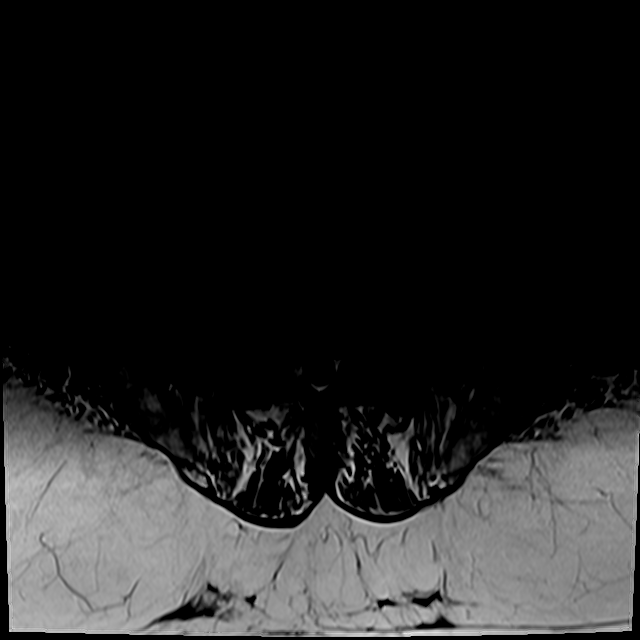
[im 10/32]
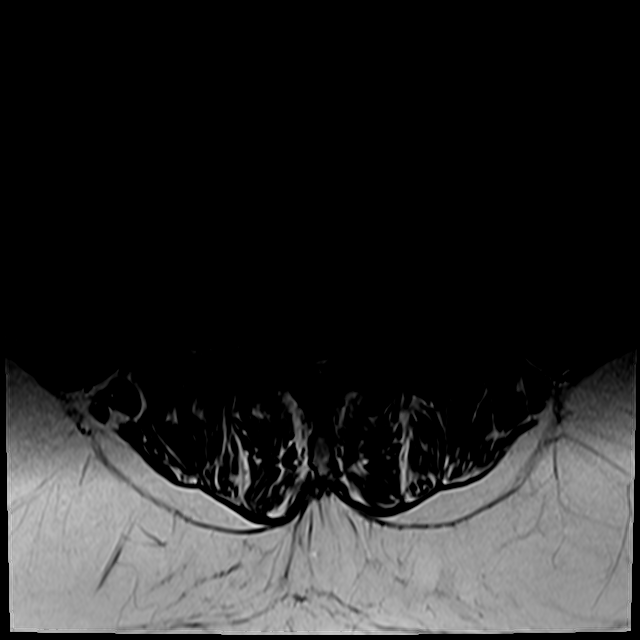
[im 17/32]
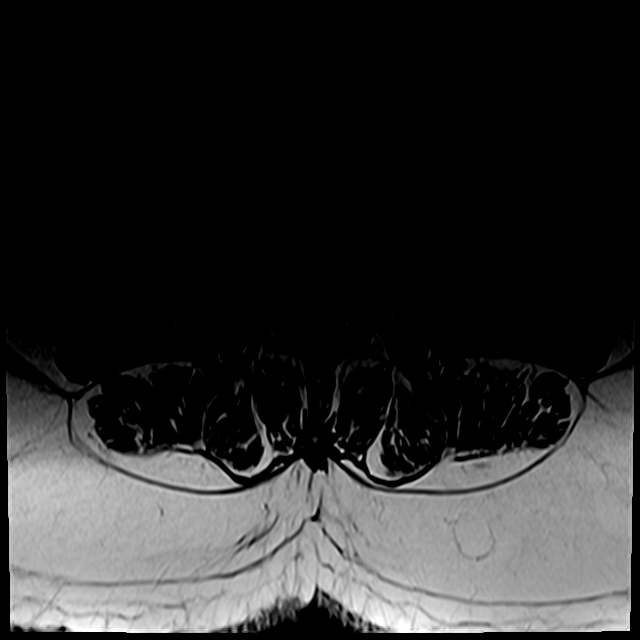
[im 27/32]
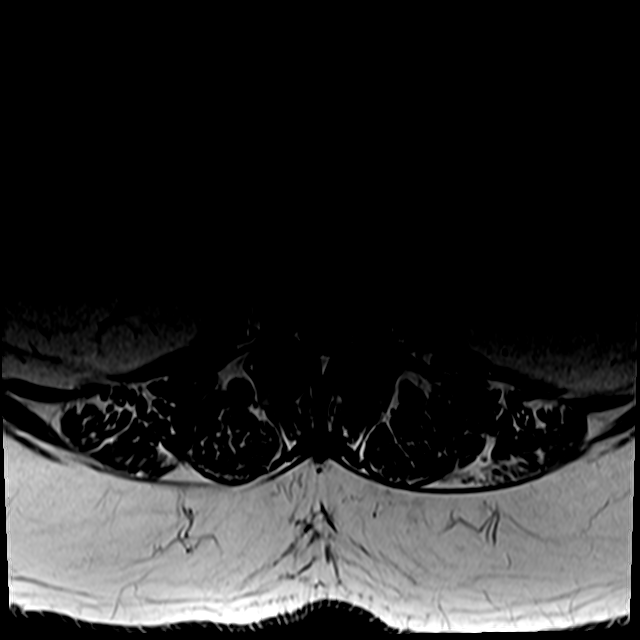

[26 of 48 positions shown; findings below may reference images not displayed]

FINDINGS: Segmentation:  Standard.

Alignment: Trace anterolisthesis L4 on L5 is due to facet
degenerative disease.

Vertebrae: No fracture, evidence of discitis, or bone lesion. The
patient has a congenitally narrow central spinal canal due to short
pedicle length.

Conus medullaris and cauda equina: Conus extends to the L1-2 level.
Conus and cauda equina appear normal.

Paraspinal and other soft tissues: Negative.

Disc levels:

T11-12 and T12-L1 are imaged in the sagittal plane only and
negative.

L1-2: Negative.

L2-3: Mild facet arthropathy. Otherwise negative.

L3-4: Mild facet arthropathy and a minimal disc bulge. No stenosis.

L4-5: Central protrusion, bulky ligamentum flavum thickening and
mild to moderate facet arthropathy. There is severe central canal
and bilateral subarticular recess narrowing. Moderate to moderately
severe bilateral foraminal narrowing appears worse on left.

L5-S1: Right laminotomy defect. There is a shallow disc bulge and
endplate spur to the right. The central canal and foramina are open.
IMPRESSION: Congenitally narrow central canal due to short pedicle length.

Severe congenital and acquired central canal and bilateral
subarticular recess narrowing at L4-5 where there is a central disc
protrusion and ligamentum flavum thickening. There is also moderate
to moderately severe foraminal narrowing at this level, worse on the
left.

Status post right laminotomy at L5-S1.  No stenosis.

## 2020-11-02 DIAGNOSIS — H2512 Age-related nuclear cataract, left eye: Secondary | ICD-10-CM | POA: Diagnosis not present

## 2020-11-02 DIAGNOSIS — H52223 Regular astigmatism, bilateral: Secondary | ICD-10-CM | POA: Diagnosis not present

## 2020-11-02 DIAGNOSIS — H5203 Hypermetropia, bilateral: Secondary | ICD-10-CM | POA: Diagnosis not present

## 2020-11-02 DIAGNOSIS — E119 Type 2 diabetes mellitus without complications: Secondary | ICD-10-CM | POA: Diagnosis not present

## 2020-11-02 DIAGNOSIS — H524 Presbyopia: Secondary | ICD-10-CM | POA: Diagnosis not present

## 2020-11-02 DIAGNOSIS — H43813 Vitreous degeneration, bilateral: Secondary | ICD-10-CM | POA: Diagnosis not present

## 2020-11-16 ENCOUNTER — Ambulatory Visit: Payer: Medicare PPO | Admitting: Family Medicine

## 2020-11-16 ENCOUNTER — Other Ambulatory Visit: Payer: Self-pay

## 2020-11-16 ENCOUNTER — Encounter: Payer: Self-pay | Admitting: Family Medicine

## 2020-11-16 VITALS — BP 121/54 | HR 56 | Ht 72.0 in | Wt 302.0 lb

## 2020-11-16 DIAGNOSIS — E1159 Type 2 diabetes mellitus with other circulatory complications: Secondary | ICD-10-CM | POA: Diagnosis not present

## 2020-11-16 DIAGNOSIS — E1169 Type 2 diabetes mellitus with other specified complication: Secondary | ICD-10-CM | POA: Diagnosis not present

## 2020-11-16 DIAGNOSIS — E785 Hyperlipidemia, unspecified: Secondary | ICD-10-CM

## 2020-11-16 DIAGNOSIS — I152 Hypertension secondary to endocrine disorders: Secondary | ICD-10-CM

## 2020-11-16 LAB — CBC WITH DIFFERENTIAL/PLATELET
Basophils Absolute: 0 10*3/uL (ref 0.0–0.2)
Basos: 0 %
EOS (ABSOLUTE): 0.3 10*3/uL (ref 0.0–0.4)
Eos: 5 %
Hematocrit: 40.3 % (ref 37.5–51.0)
Hemoglobin: 13.5 g/dL (ref 13.0–17.7)
Immature Grans (Abs): 0 10*3/uL (ref 0.0–0.1)
Immature Granulocytes: 0 %
Lymphocytes Absolute: 2.2 10*3/uL (ref 0.7–3.1)
Lymphs: 31 %
MCH: 30.3 pg (ref 26.6–33.0)
MCHC: 33.5 g/dL (ref 31.5–35.7)
MCV: 91 fL (ref 79–97)
Monocytes Absolute: 0.7 10*3/uL (ref 0.1–0.9)
Monocytes: 10 %
Neutrophils Absolute: 3.8 10*3/uL (ref 1.4–7.0)
Neutrophils: 54 %
Platelets: 263 10*3/uL (ref 150–450)
RBC: 4.45 x10E6/uL (ref 4.14–5.80)
RDW: 12.5 % (ref 11.6–15.4)
WBC: 7 10*3/uL (ref 3.4–10.8)

## 2020-11-16 LAB — LIPID PANEL
Chol/HDL Ratio: 4 ratio (ref 0.0–5.0)
Cholesterol, Total: 161 mg/dL (ref 100–199)
HDL: 40 mg/dL (ref >39)
LDL Chol Calc (NIH): 102 mg/dL — ABNORMAL HIGH (ref 0–99)
Triglycerides: 106 mg/dL (ref 0–149)
VLDL Cholesterol Cal: 19 mg/dL (ref 5–40)

## 2020-11-16 LAB — CMP14+EGFR
ALT: 16 IU/L (ref 0–44)
AST: 22 IU/L (ref 0–40)
Albumin/Globulin Ratio: 1.5 (ref 1.2–2.2)
Albumin: 4.2 g/dL (ref 3.7–4.7)
Alkaline Phosphatase: 77 IU/L (ref 44–121)
BUN/Creatinine Ratio: 14 (ref 10–24)
BUN: 14 mg/dL (ref 8–27)
Bilirubin Total: 1 mg/dL (ref 0.0–1.2)
CO2: 25 mmol/L (ref 20–29)
Calcium: 9.5 mg/dL (ref 8.6–10.2)
Chloride: 103 mmol/L (ref 96–106)
Creatinine, Ser: 1.03 mg/dL (ref 0.76–1.27)
Globulin, Total: 2.8 g/dL (ref 1.5–4.5)
Glucose: 121 mg/dL — ABNORMAL HIGH (ref 65–99)
Potassium: 4.7 mmol/L (ref 3.5–5.2)
Sodium: 141 mmol/L (ref 134–144)
Total Protein: 7 g/dL (ref 6.0–8.5)
eGFR: 76 mL/min/{1.73_m2} (ref >59)

## 2020-11-16 LAB — BAYER DCA HB A1C WAIVED: HB A1C (BAYER DCA - WAIVED): 6.5 % (ref <7.0)

## 2020-11-16 MED ORDER — PIOGLITAZONE HCL 30 MG PO TABS: 30.0000 mg | ORAL_TABLET | Freq: Every day | ORAL | 3 refills | Status: DC

## 2020-11-16 MED ORDER — ATORVASTATIN CALCIUM 10 MG PO TABS: 10.0000 mg | ORAL_TABLET | Freq: Every day | ORAL | 3 refills | Status: DC

## 2020-11-16 NOTE — Progress Notes (Signed)
BP (!) 121/54   Pulse (!) 56   Ht 6' (1.829 m)   Wt (!) 302 lb (137 kg)   SpO2 99%   BMI 40.96 kg/m    Subjective:   Patient ID: Brett Jensen, male    DOB: February 24, 1946, 75 y.o.   MRN: [id number]  HPI: Brett Jensen is a 75 y.o. male presenting on 11/16/2020 for Medical Management of Chronic Issues, Hyperlipidemia, Hypertension, and Diabetes   HPI Type 2 diabetes mellitus Patient comes in today for recheck of his diabetes. Patient has been currently taking Actos. Patient is currently on an ACE inhibitor/ARB. Patient has not seen an ophthalmologist this year. Patient denies any issues with their feet. The symptom started onset as an adult hypertension and hyperlipidemia ARE RELATED TO DM   Hypertension Patient is currently on lisinopril hydrochlorothiazide but says he has not taken it in at least 2 or 3 days because he forgot., and their blood pressure today is 121/54. Patient denies any lightheadedness or dizziness. Patient denies headaches, blurred vision, chest pains, shortness of breath, or weakness. Denies any side effects from medication and is content with current medication.   Hyperlipidemia Patient is coming in for recheck of his hyperlipidemia. The patient is currently taking atorvastatin. They deny any issues with myalgias or history of liver damage from it. They deny any focal numbness or weakness or chest pain.   Relevant past medical, surgical, family and social history reviewed and updated as indicated. Interim medical history since our last visit reviewed. Allergies and medications reviewed and updated.  Review of Systems  Constitutional: Negative for chills and fever.  Respiratory: Negative for shortness of breath and wheezing.   Cardiovascular: Positive for leg swelling (Same amount that he usually has.). Negative for chest pain.  Musculoskeletal: Negative for back pain and gait problem.  Skin: Negative for rash.  Neurological: Negative for dizziness, weakness,  light-headedness and numbness.  All other systems reviewed and are negative.   Per HPI unless specifically indicated above   Allergies as of 11/16/2020   No Known Allergies     Medication List       Accurate as of November 16, 2020  8:45 AM. If you have any questions, ask your nurse or doctor.        Accu-Chek Aviva Plus test strip Generic drug: glucose blood Check BS once a day or as needed Dx E11.9   Accu-Chek Aviva Plus w/Device Kit 1 Device by Does not apply route 2 (two) times daily at 10 AM and 5 PM.   aspirin EC 81 MG tablet Take 81 mg by mouth daily.   atorvastatin 10 MG tablet Commonly known as: LIPITOR Take 1 tablet (10 mg total) by mouth daily.   baclofen 10 MG tablet Commonly known as: LIORESAL Take 1 tablet (10 mg total) by mouth at bedtime as needed for muscle spasms.   losartan-hydrochlorothiazide 100-12.5 MG tablet Commonly known as: HYZAAR TAKE 1 TABLET EVERY DAY   pioglitazone 30 MG tablet Commonly known as: ACTOS Take 1 tablet (30 mg total) by mouth daily.        Objective:   BP (!) 121/54   Pulse (!) 56   Ht 6' (1.829 m)   Wt (!) 302 lb (137 kg)   SpO2 99%   BMI 40.96 kg/m   Wt Readings from Last 3 Encounters:  11/16/20 (!) 302 lb (137 kg)  05/20/20 (!) 311 lb (141.1 kg)  02/13/20 (!) 302 lb (137 kg)  Physical Exam Vitals and nursing note reviewed.  Constitutional:      General: He is not in acute distress.    Appearance: He is well-developed. He is not diaphoretic.  Eyes:     General: No scleral icterus.    Conjunctiva/sclera: Conjunctivae normal.  Neck:     Thyroid: No thyromegaly.  Cardiovascular:     Rate and Rhythm: Normal rate and regular rhythm.     Heart sounds: Normal heart sounds. No murmur heard.   Pulmonary:     Effort: Pulmonary effort is normal. No respiratory distress.     Breath sounds: Normal breath sounds. No wheezing.  Musculoskeletal:        General: Swelling (Trace swelling in bilateral lower  extremity) present.     Cervical back: Neck supple.  Lymphadenopathy:     Cervical: No cervical adenopathy.  Skin:    General: Skin is warm and dry.     Findings: No rash.  Neurological:     Mental Status: He is alert and oriented to person, place, and time.     Coordination: Coordination normal.  Psychiatric:        Behavior: Behavior normal.       Assessment & Plan:   Problem List Items Addressed This Visit      Cardiovascular and Mediastinum   Hypertension associated with diabetes ([place])   Relevant Medications   pioglitazone (ACTOS) 30 MG tablet   atorvastatin (LIPITOR) 10 MG tablet   Other Relevant Orders   CBC with Differential/Platelet   CMP14+EGFR   Lipid panel     Endocrine   DM (diabetes mellitus) ([facility])   Relevant Medications   pioglitazone (ACTOS) 30 MG tablet   atorvastatin (LIPITOR) 10 MG tablet   Other Relevant Orders   Lipid panel   Bayer DCA Hb A1c Waived   Hyperlipidemia associated with type 2 diabetes mellitus ([facility]CC) - Primary   Relevant Medications   pioglitazone (ACTOS) 30 MG tablet   atorvastatin (LIPITOR) 10 MG tablet   Other Relevant Orders   Lipid panel      Patient says he has been off of his blood pressure medicine for couple days and his blood pressure is on the lower side at 121/56 so we will do a 2-week trial where he is not going to take his blood pressure medicines and he will call me after the 2 weeks with some blood pressure results, he is going to check every day and see how it does for him.  If goes well then we will take off blood pressure medicines for blood pressure control. Follow up plan: Return in about 3 months (around 02/15/2021), or if symptoms worsen or fail to improve, for Diabetes and hypertension and cholesterol.  Counseling provided for all of the vaccine components Orders Placed This Encounter  Procedures  . CBC with Differential/Platelet  . CMP14+EGFR  . Lipid panel  . Bayer [facility] Hb A1c [place], MD [facility] 11/16/2020, 8:45 AM

## 2020-11-18 ENCOUNTER — Ambulatory Visit: Payer: Medicare PPO | Admitting: Family Medicine

## 2020-11-26 ENCOUNTER — Other Ambulatory Visit: Payer: Self-pay | Admitting: Family Medicine

## 2020-11-26 DIAGNOSIS — I152 Hypertension secondary to endocrine disorders: Secondary | ICD-10-CM

## 2020-11-26 DIAGNOSIS — E1159 Type 2 diabetes mellitus with other circulatory complications: Secondary | ICD-10-CM

## 2020-12-02 ENCOUNTER — Telehealth: Payer: Self-pay

## 2020-12-02 NOTE — Telephone Encounter (Signed)
Based off of BP readings pt sent to Dettinger he may hold his BP medications per Dr. Louanne Skye.  Numbers sent to be scanned.

## 2020-12-25 ENCOUNTER — Encounter: Payer: Self-pay | Admitting: Family Medicine

## 2020-12-25 ENCOUNTER — Ambulatory Visit: Payer: Medicare PPO | Admitting: Family Medicine

## 2020-12-25 ENCOUNTER — Other Ambulatory Visit: Payer: Self-pay

## 2020-12-25 VITALS — BP 156/75 | HR 60 | Temp 97.3°F | Ht 73.0 in | Wt 303.4 lb

## 2020-12-25 DIAGNOSIS — W57XXXA Bitten or stung by nonvenomous insect and other nonvenomous arthropods, initial encounter: Secondary | ICD-10-CM | POA: Diagnosis not present

## 2020-12-25 DIAGNOSIS — S30861A Insect bite (nonvenomous) of abdominal wall, initial encounter: Secondary | ICD-10-CM | POA: Diagnosis not present

## 2020-12-25 NOTE — Progress Notes (Signed)
Assessment & Plan:  1. Tick bite of abdomen, initial encounter Reassurance provided.  No tick remaining.  No signs or symptoms of infection.   Follow up plan: Return if symptoms worsen or fail to improve.  Hendricks Limes, MSN, APRN, FNP-C [facility]  Subjective:   Patient ID: Brett Jensen, male    DOB: 1945/09/04, 75 y.o.   MRN: [id number]  HPI: Brett Jensen is a 75 y.o. male presenting on 12/25/2020 for Tick Removal (Abdominal x 2 days )  Patient reports he removed a tick out of his umbilicus 2 days ago.  He states he can feel something in there and just wants someone to look and make sure he got all of the tick.  Denies headaches, fever, nausea, or vomiting.   ROS: Negative unless specifically indicated above in HPI.   Relevant past medical history reviewed and updated as indicated.   Allergies and medications reviewed and updated.   Current Outpatient Medications:  .  aspirin EC 81 MG tablet, Take 81 mg by mouth daily., Disp: , Rfl:  .  atorvastatin (LIPITOR) 10 MG tablet, Take 1 tablet (10 mg total) by mouth daily., Disp: 90 tablet, Rfl: 3 .  baclofen (LIORESAL) 10 MG tablet, Take 1 tablet (10 mg total) by mouth at bedtime as needed for muscle spasms., Disp: 90 tablet, Rfl: 1 .  Blood Glucose Monitoring Suppl (ACCU-CHEK AVIVA PLUS) w/Device KIT, 1 Device by Does not apply route 2 (two) times daily at 10 AM and 5 PM., Disp: 1 kit, Rfl: 0 .  glucose blood (ACCU-CHEK AVIVA PLUS) test strip, Check BS once a day or as needed Dx E11.9, Disp: 100 strip, Rfl: 3 .  losartan-hydrochlorothiazide (HYZAAR) 100-12.5 MG tablet, TAKE 1 TABLET EVERY DAY, Disp: 90 tablet, Rfl: 1 .  pioglitazone (ACTOS) 30 MG tablet, Take 1 tablet (30 mg total) by mouth daily., Disp: 90 tablet, Rfl: 3  No Known Allergies  Objective:   BP (!) 156/75   Pulse 60   Temp (!) 97.3 F (36.3 C) (Temporal)   Ht '6\' 1"'  (1.854 m)   Wt (!) 303 lb 6.4 oz (137.6 kg)   SpO2 95%   BMI 40.03  kg/m    Physical Exam Vitals reviewed.  Constitutional:      General: He is not in acute distress.    Appearance: Normal appearance. He is not ill-appearing, toxic-appearing or diaphoretic.  HENT:     Head: Normocephalic and atraumatic.  Eyes:     General: No scleral icterus.       Right eye: No discharge.        Left eye: No discharge.     Conjunctiva/sclera: Conjunctivae normal.  Cardiovascular:     Rate and Rhythm: Normal rate.  Pulmonary:     Effort: Pulmonary effort is normal. No respiratory distress.  Musculoskeletal:        General: Normal range of motion.     Cervical back: Normal range of motion.  Skin:    General: Skin is warm and dry.     Comments: Small scab inside of umbilicus.  No remaining tick visualized.  No erythema, warmth, swelling, or drainage.  Neurological:     Mental Status: He is alert and oriented to person, place, and time. Mental status is at baseline.  Psychiatric:        Mood and Affect: Mood normal.        Behavior: Behavior normal.        Thought  Content: Thought content normal.        Judgment: Judgment normal.

## 2020-12-29 ENCOUNTER — Ambulatory Visit (INDEPENDENT_AMBULATORY_CARE_PROVIDER_SITE_OTHER): Payer: Medicare PPO | Admitting: *Deleted

## 2020-12-29 DIAGNOSIS — Z0000 Encounter for general adult medical examination without abnormal findings: Secondary | ICD-10-CM | POA: Diagnosis not present

## 2020-12-29 NOTE — Progress Notes (Signed)
MEDICARE ANNUAL WELLNESS VISIT  12/29/2020  Telephone Visit Disclaimer This Medicare AWV was conducted by telephone due to national recommendations for restrictions regarding the COVID-19 Pandemic (e.g. social distancing).  I verified, using two identifiers, that I am speaking with Brett Jensen or their authorized healthcare agent. I discussed the limitations, risks, security, and privacy concerns of performing an evaluation and management service by telephone and the potential availability of an in-person appointment in the future. The patient expressed understanding and agreed to proceed.  Location of Patient: home Location of Provider (nurse):  [facility] Subjective:    Brett Jensen is a 75 y.o. male patient of Dettinger, Fransisca Kaufmann, MD who had a Medicare Annual Wellness Visit today via telephone. Brett Jensen is retired and and his son resides with him. He has 2 children. He reports that he is socially active and does interact with friends/family regularly. He is minimally physically active and enjoys fishing and hunting.   Patient Care Team: Dettinger, Fransisca Kaufmann, MD as PCP - General (Family Medicine) Ralene Muskrat as Physician Assistant (Chiropractic Medicine)  Advanced Directives 12/29/2020 12/26/2019 12/20/2018 09/06/2018 05/11/2015  Does Patient Have a Medical Advance Directive? Yes Yes No No No  Type of Advance Directive Living will Living will - - -  Does patient want to make changes to medical advance directive? - No - Patient declined - - -  Would patient like information on creating a medical advance directive? - - - No - Patient declined -    Hospital Utilization Over the Past 12 Months: # of hospitalizations or ER visits: 0 # of surgeries: 1  Review of Systems    Patient reports that his overall health is unchanged compared to last year.  History obtained from the patient and patient chart.   Patient Reported Readings (BP, Pulse, CBG, Weight, etc) none  Pain  Assessment Pain : No/denies pain     Current Medications & Allergies (verified) Allergies as of 12/29/2020   No Known Allergies     Medication List       Accurate as of Dec 29, 2020  9:13 AM. If you have any questions, ask your nurse or doctor.        Accu-Chek Aviva Plus test strip Generic drug: glucose blood Check BS once a day or as needed Dx E11.9   Accu-Chek Aviva Plus w/Device Kit 1 Device by Does not apply route 2 (two) times daily at 10 AM and 5 PM.   aspirin EC 81 MG tablet Take 81 mg by mouth daily.   atorvastatin 10 MG tablet Commonly known as: LIPITOR Take 1 tablet (10 mg total) by mouth daily.   baclofen 10 MG tablet Commonly known as: LIORESAL Take 1 tablet (10 mg total) by mouth at bedtime as needed for muscle spasms.   losartan-hydrochlorothiazide 100-12.5 MG tablet Commonly known as: HYZAAR TAKE 1 TABLET EVERY DAY   pioglitazone 30 MG tablet Commonly known as: ACTOS Take 1 tablet (30 mg total) by mouth daily.       History (reviewed): Past Medical History:  Diagnosis Date  . Arthritis    bilateral knees  . Diabetes mellitus without complication ([place])   . Erectile dysfunction   . Hyperlipidemia   . Hypertension   . Restless leg syndrome   . Shingles    Past Surgical History:  Procedure Laterality Date  . JOINT REPLACEMENT Right    ankle  . SPINE SURGERY     lower back  Family History  Problem Relation Age of Onset  . Cancer Mother        leukemia  . Diabetes Mother   . Cancer Father        throat   . Cancer Sister        back   . Diabetes Sister   . Diabetes Sister   . Hypertension Daughter   . Asthma Son   . Diabetes Brother   . Heart attack Brother   . Heart attack Brother    Social History   Socioeconomic History  . Marital status: Widowed    Spouse name: Not on file  . Number of children: 2  . Years of education: Not on file  . Highest education level: 7th grade  Occupational History  . Occupation: retired      Comment: state of [place]   Tobacco Use  . Smoking status: Current Every Day Smoker    Types: Cigars  . Smokeless tobacco: Never Used  Vaping Use  . Vaping Use: Never used  Substance and Sexual Activity  . Alcohol use: No  . Drug use: No  . Sexual activity: Not on file  Other Topics Concern  . Not on file  Social History Narrative   Lives in his home = son lives with him   Social Determinants of Health   Financial Resource Strain: Not on file  Food Insecurity: Not on file  Transportation Needs: Not on file  Physical Activity: Not on file  Stress: Not on file  Social Connections: Not on file    Activities of Daily Living In your present state of health, do you have any difficulty performing the following activities: 12/29/2020  Hearing? N  Vision? N  Difficulty concentrating or making decisions? N  Walking or climbing stairs? N  Dressing or bathing? N  Doing errands, shopping? N  Preparing Food and eating ? N  Using the Toilet? N  In the past six months, have you accidently leaked urine? N  Do you have problems with loss of bowel control? N  Managing your Medications? N  Managing your Finances? N  Housekeeping or managing your Housekeeping? N  Some recent data might be hidden    Patient Education/ Literacy How often do you need to have someone help you when you read instructions, pamphlets, or other written materials from your doctor or pharmacy?: 1 - Never What is the last grade level you completed in school?: 7th  Exercise Current Exercise Habits: The patient does not participate in regular exercise at present, Exercise limited by: None identified  Diet Patient reports consuming 3 meals a day and 0 snack(s) a day Patient reports that his primary diet is: Regular Patient reports that she does have regular access to food.   Depression Screen PHQ 2/9 Scores 12/29/2020 05/20/2020 02/13/2020 01/03/2020 12/26/2019 11/22/2019 12/20/2018  PHQ - 2 Score 0 0 0 0 0 0 0      Fall Risk Fall Risk  12/29/2020 05/20/2020 02/13/2020 01/03/2020 12/26/2019  Falls in the past year? 0 0 0 0 0     Objective:  Brett Jensen seemed alert and oriented and he participated appropriately during our telephone visit.  Blood Pressure Weight BMI  BP Readings from Last 3 Encounters:  12/25/20 (!) 156/75  11/16/20 (!) 121/54  05/20/20 122/68   Wt Readings from Last 3 Encounters:  12/25/20 (!) 303 lb 6.4 oz (137.6 kg)  11/16/20 (!) 302 lb (137 kg)  05/20/20 (!) 311 lb (141.1  kg)   BMI Readings from Last 1 Encounters:  12/25/20 40.03 kg/m    *Unable to obtain current vital signs, weight, and BMI due to telephone visit type  Hearing/Vision  . Brett Jensen did not seem to have difficulty with hearing/understanding during the telephone conversation . Reports that he has had a formal eye exam by an eye care professional within the past year . Reports that he has not had a formal hearing evaluation within the past year *Unable to fully assess hearing and vision during telephone visit type  Cognitive Function: 6CIT Screen 12/29/2020 12/26/2019 12/20/2018  What Year? 0 points 0 points 0 points  What month? 0 points 0 points 0 points  What time? 0 points 0 points 0 points  Count back from 20 2 points 0 points 0 points  Months in reverse 4 points 2 points 2 points  Repeat phrase 10 points 4 points 4 points  Total Score '16 6 6   ' (Normal:0-7, Significant for Dysfunction: >8)  Normal Cognitive Function Screening: No: 16 (didn't attempt two questions)   Immunization & Health Maintenance Record Immunization History  Administered Date(s) Administered  . Fluad Quad(high Dose 65+) 05/04/2020  . Influenza, High Dose Seasonal PF 04/01/2014, 04/27/2018, 04/04/2019  . Influenza,inj,Quad PF,6+ Mos 06/24/2013  . Influenza-Unspecified 05/04/2020  . Moderna Sars-Covid-2 Vaccination 09/11/2019, 10/09/2019  . Pneumococcal Conjugate-13 04/02/2015  . Pneumococcal Polysaccharide-23 07/05/2017  .  Tdap 01/03/2018    Health Maintenance  Topic Date Due  . Zoster Vaccines- Shingrix (1 of 2) Never done  . FOOT EXAM  11/21/2020  . COVID-19 Vaccine (3 - Booster for Moderna series) 05/17/2021 (Originally 03/10/2020)  . OPHTHALMOLOGY EXAM  02/02/2021  . INFLUENZA VACCINE  03/01/2021  . HEMOGLOBIN A1C  05/18/2021  . COLONOSCOPY (Pts 45-18yr Insurance coverage will need to be confirmed)  02/15/2023  . TETANUS/TDAP  01/04/2028  . Hepatitis C Screening  Completed  . PNA vac Low Risk Adult  Completed  . HPV VACCINES  Aged Out       Assessment  This is a routine wellness examination for JCarolee Jensen  Health Maintenance: Due or Overdue Health Maintenance Due  Topic Date Due  . Zoster Vaccines- Shingrix (1 of 2) Never done  . FOOT EXAM  11/21/2020    JCarolee Rotadoes not need a referral for Community Assistance: Care Management:   no Social Work:    no Prescription Assistance:  no Nutrition/Diabetes Education:  no   Plan:  Personalized Goals Goals Addressed            This Visit's Progress   . Patient Stated       12/29/2020 AWV Goal: Exercise for General Health   Patient will verbalize understanding of the benefits of increased physical activity:  Exercising regularly is important. It will improve your overall fitness, flexibility, and endurance.  Regular exercise also will improve your overall health. It can help you control your weight, reduce stress, and improve your bone density.  Over the next year, patient will increase physical activity as tolerated with a goal of at least 150 minutes of moderate physical activity per week.   You can tell that you are exercising at a moderate intensity if your heart starts beating faster and you start breathing faster but can still hold a conversation.  Moderate-intensity exercise ideas include:  Walking 1 mile (1.6 km) in about 15 minutes  Biking  Hiking  Golfing  Dancing  Water aerobics  Patient will  verbalize understanding of  everyday activities that increase physical activity by providing examples like the following: ? Yard work, such as: ? Pushing a Conservation officer, nature ? Raking and bagging leaves ? Washing your car ? Pushing a stroller ? Shoveling snow ? Gardening ? Washing windows or floors  Patient will be able to explain general safety guidelines for exercising:   Before you start a new exercise program, talk with your health care provider.  Do not exercise so much that you hurt yourself, feel dizzy, or get very short of breath.  Wear comfortable clothes and wear shoes with good support.  Drink plenty of water while you exercise to prevent dehydration or heat stroke.  Work out until your breathing and your heartbeat get faster.       Personalized Health Maintenance & Screening Recommendations  Shingles Vaccine  Lung Cancer Screening Recommended: yes (Low Dose CT Chest recommended if Age 62-80 years, 30 pack-year currently smoking OR have quit w/in past 15 years) Hepatitis C Screening recommended: no HIV Screening recommended: no  Advanced Directives: Written information was not prepared per patient's request.  Referrals & Orders No orders of the defined types were placed in this encounter.   Follow-up Plan . Follow-up with Dettinger, Fransisca Kaufmann, MD as planned   I have personally reviewed and noted the following in the patient's chart:   . Medical and social history . Use of alcohol, tobacco or illicit drugs  . Current medications and supplements . Functional ability and status . Nutritional status . Physical activity . Advanced directives . List of other physicians . Hospitalizations, surgeries, and ER visits in previous 12 months . Vitals . Screenings to include cognitive, depression, and falls . Referrals and appointments  In addition, I have reviewed and discussed with Brett Jensen certain preventive protocols, quality metrics, and best practice  recommendations. A written personalized care plan for preventive services as well as general preventive health recommendations is available and can be mailed to the patient at his request.      Christia Reading, LPN  [id number]

## 2021-02-02 ENCOUNTER — Other Ambulatory Visit: Payer: Self-pay | Admitting: Family Medicine

## 2021-02-17 ENCOUNTER — Other Ambulatory Visit: Payer: Self-pay

## 2021-02-17 ENCOUNTER — Encounter: Payer: Self-pay | Admitting: Family Medicine

## 2021-02-17 ENCOUNTER — Ambulatory Visit: Payer: Medicare PPO | Admitting: Family Medicine

## 2021-02-17 VITALS — BP 145/74 | HR 59 | Ht 73.0 in | Wt 305.0 lb

## 2021-02-17 DIAGNOSIS — I152 Hypertension secondary to endocrine disorders: Secondary | ICD-10-CM | POA: Diagnosis not present

## 2021-02-17 DIAGNOSIS — E785 Hyperlipidemia, unspecified: Secondary | ICD-10-CM | POA: Diagnosis not present

## 2021-02-17 DIAGNOSIS — E1169 Type 2 diabetes mellitus with other specified complication: Secondary | ICD-10-CM | POA: Diagnosis not present

## 2021-02-17 DIAGNOSIS — E1159 Type 2 diabetes mellitus with other circulatory complications: Secondary | ICD-10-CM

## 2021-02-17 LAB — BAYER DCA HB A1C WAIVED: HB A1C (BAYER DCA - WAIVED): 5.9 % (ref <7.0)

## 2021-02-17 NOTE — Progress Notes (Signed)
BP (!) 145/74   Pulse (!) 59   Ht '6\' 1"'  (1.854 m)   Wt (!) 305 lb (138.3 kg)   SpO2 96%   BMI 40.24 kg/m    Subjective:   Patient ID: Brett Jensen, male    DOB: [birth year], 75 y.o.   MRN: [id number]  HPI: Brett Jensen is a 75 y.o. male presenting on 02/17/2021 for Medical Management of Chronic Issues, Hyperlipidemia, Hypertension, and Diabetes   HPI Type 2 diabetes mellitus Patient comes in today for recheck of his diabetes. Patient has been currently taking Actos, A1c is 5.9. Patient is currently on an ACE inhibitor/ARB. Patient has not seen an ophthalmologist this year. Patient denies any issues with their feet. The symptom started onset as an adult hypertension hyperlipidemia ARE RELATED TO DM   Hypertension Patient is currently on has been holding his losartan hydrochlorothiazide and his blood pressures been running good, and their blood pressure today is 145/74. Patient denies any lightheadedness or dizziness. Patient denies headaches, blurred vision, chest pains, shortness of breath, or weakness. Denies any side effects from medication and is content with current medication.   Hyperlipidemia Patient is coming in for recheck of his hyperlipidemia. The patient is currently taking atorvastatin. They deny any issues with myalgias or history of liver damage from it. They deny any focal numbness or weakness or chest pain.   Relevant past medical, surgical, family and social history reviewed and updated as indicated. Interim medical history since our last visit reviewed. Allergies and medications reviewed and updated.  Review of Systems  Constitutional:  Negative for chills and fever.  Respiratory:  Negative for shortness of breath and wheezing.   Cardiovascular:  Negative for chest pain and leg swelling.  Musculoskeletal:  Negative for back pain and gait problem.  Skin:  Negative for rash.  Neurological:  Negative for dizziness and light-headedness.  All other systems reviewed  and are negative.  Per HPI unless specifically indicated above   Allergies as of 02/17/2021   No Known Allergies      Medication List        Accurate as of February 17, 2021 11:16 AM. If you have any questions, ask your nurse or doctor.          STOP taking these medications    losartan-hydrochlorothiazide 100-12.5 MG tablet Commonly known as: HYZAAR Stopped by: Fransisca Kaufmann Tyriana Helmkamp, MD   pioglitazone 30 MG tablet Commonly known as: ACTOS Stopped by: Fransisca Kaufmann Azariah Latendresse, MD       TAKE these medications    Accu-Chek Aviva Plus test strip Generic drug: glucose blood CHECK BLOOD SUGAR ONCE A DAY OR AS DIRECTED   Accu-Chek Aviva Plus w/Device Kit 1 Device by Does not apply route 2 (two) times daily at 10 AM and 5 PM.   aspirin EC 81 MG tablet Take 81 mg by mouth daily.   atorvastatin 10 MG tablet Commonly known as: LIPITOR Take 1 tablet (10 mg total) by mouth daily.   baclofen 10 MG tablet Commonly known as: LIORESAL Take 1 tablet (10 mg total) by mouth at bedtime as needed for muscle spasms.         Objective:   BP (!) 145/74   Pulse (!) 59   Ht '6\' 1"'  (1.854 m)   Wt (!) 305 lb (138.3 kg)   SpO2 96%   BMI 40.24 kg/m   Wt Readings from Last 3 Encounters:  02/17/21 (!) 305 lb (138.3 kg)  12/25/20 Marland Kitchen)  303 lb 6.4 oz (137.6 kg)  11/16/20 (!) 302 lb (137 kg)    Physical Exam Vitals and nursing note reviewed.  Constitutional:      General: He is not in acute distress.    Appearance: He is well-developed. He is not diaphoretic.  Eyes:     General: No scleral icterus.    Conjunctiva/sclera: Conjunctivae normal.  Neck:     Thyroid: No thyromegaly.  Cardiovascular:     Rate and Rhythm: Normal rate and regular rhythm.     Heart sounds: Normal heart sounds. No murmur heard. Pulmonary:     Effort: Pulmonary effort is normal. No respiratory distress.     Breath sounds: Normal breath sounds. No wheezing.  Musculoskeletal:        General: Swelling (1 plus  pitting edema bilateral) present. Normal range of motion.     Cervical back: Neck supple.  Lymphadenopathy:     Cervical: No cervical adenopathy.  Skin:    General: Skin is warm and dry.     Findings: No rash.  Neurological:     Mental Status: He is alert and oriented to person, place, and time.     Coordination: Coordination normal.  Psychiatric:        Behavior: Behavior normal.      Assessment & Plan:   Problem List Items Addressed This Visit       Cardiovascular and Mediastinum   Hypertension associated with diabetes ([place])     Endocrine   DM (diabetes mellitus) ([place]) - Primary   Relevant Orders   Bayer DCA Hb A1c Waived   Hyperlipidemia associated with type 2 diabetes mellitus ([place])     Other   Morbid obesity ([place])    A1c looks good at 5.9, continue to hold losartan hydrochlorothiazide and will stop his Actos at this point Follow up plan: Return in about 3 months (around 05/20/2021), or if symptoms worsen or fail to improve, for Hypertension and diabetes and cholesterol.  Counseling provided for all of the vaccine components Orders Placed This Encounter  Procedures   Bayer [place] Hb A1c Helena Denice Cardon, MD [facility] 02/17/2021, 11:16 AM

## 2021-03-10 ENCOUNTER — Other Ambulatory Visit: Payer: Self-pay | Admitting: Family Medicine

## 2021-03-10 NOTE — Telephone Encounter (Signed)
[facility] called stating that they need Dr Dettinger to send new Rx for patients test strips and need to clarify that Brett Jensen is to check twice a day, in order for insurance to pay for, since hes early for refill.  Brett Jensen says he told pharmacy that Dr Dettinger told him to check it twice a day but current Rx says to check once a day.  Please advise and send new Rx if able.

## 2021-03-10 NOTE — Telephone Encounter (Signed)
Please advise if BID and update sig w/ Dx code

## 2021-03-11 MED ORDER — ACCU-CHEK AVIVA PLUS VI STRP: 1.0000 | ORAL_STRIP | Freq: Two times a day (BID) | 3 refills | Status: DC

## 2021-04-09 ENCOUNTER — Other Ambulatory Visit: Payer: Self-pay

## 2021-04-09 ENCOUNTER — Telehealth: Payer: Self-pay | Admitting: Family Medicine

## 2021-04-09 MED ORDER — ACCU-CHEK AVIVA PLUS W/DEVICE KIT: 1.0000 | PACK | Freq: Two times a day (BID) | 0 refills | Status: AC

## 2021-04-09 MED ORDER — ACCU-CHEK AVIVA PLUS W/DEVICE KIT: 1.0000 | PACK | Freq: Two times a day (BID) | 0 refills | Status: DC

## 2021-04-09 NOTE — Telephone Encounter (Signed)
New meter sent to [facility]

## 2021-05-20 ENCOUNTER — Other Ambulatory Visit: Payer: Self-pay

## 2021-05-20 ENCOUNTER — Ambulatory Visit: Payer: Medicare PPO | Admitting: Family Medicine

## 2021-05-20 ENCOUNTER — Encounter: Payer: Self-pay | Admitting: Family Medicine

## 2021-05-20 VITALS — BP 149/69 | HR 62 | Ht 73.0 in | Wt 291.0 lb

## 2021-05-20 DIAGNOSIS — I152 Hypertension secondary to endocrine disorders: Secondary | ICD-10-CM

## 2021-05-20 DIAGNOSIS — E1159 Type 2 diabetes mellitus with other circulatory complications: Secondary | ICD-10-CM

## 2021-05-20 DIAGNOSIS — E785 Hyperlipidemia, unspecified: Secondary | ICD-10-CM | POA: Diagnosis not present

## 2021-05-20 DIAGNOSIS — Z23 Encounter for immunization: Secondary | ICD-10-CM | POA: Diagnosis not present

## 2021-05-20 DIAGNOSIS — E1169 Type 2 diabetes mellitus with other specified complication: Secondary | ICD-10-CM

## 2021-05-20 LAB — BAYER DCA HB A1C WAIVED: HB A1C (BAYER DCA - WAIVED): 6.2 % — ABNORMAL HIGH (ref 4.8–5.6)

## 2021-05-20 NOTE — Progress Notes (Signed)
BP (!) 157/71   Pulse 62   Ht _0  (1.854 m)   Wt 291 lb (132 kg)   SpO2 98%   BMI 38.39 kg/m    Subjective:   Patient ID: Brett Jensen, male    DOB: 1946-07-21, 75 y.o.   MRN: [id number]  HPI: Brett Jensen is a 75 y.o. male presenting on 05/20/2021 for Medical Management of Chronic Issues, Hyperlipidemia, and Back Pain   HPI Type 2 diabetes mellitus Patient comes in today for recheck of his diabetes. Patient has been currently taking diet controlled, A1c 6.2. Patient is not currently on an ACE inhibitor/ARB. Patient has not seen an ophthalmologist this year. Patient denies any issues with their feet. The symptom started onset as an adult hypertension and hyperlipidemia ARE RELATED TO DM   Hypertension Patient is currently on no medication currently, and their blood pressure today is 157/71 and recheck is 149/69. Patient denies any lightheadedness or dizziness. Patient denies headaches, blurred vision, chest pains, shortness of breath, or weakness. Denies any side effects from medication and is content with current medication.   Hyperlipidemia Patient is coming in for recheck of his hyperlipidemia. The patient is currently taking atorvastatin. They deny any issues with myalgias or history of liver damage from it. They deny any focal numbness or weakness or chest pain.   Relevant past medical, surgical, family and social history reviewed and updated as indicated. Interim medical history since our last visit reviewed. Allergies and medications reviewed and updated.  Review of Systems  Constitutional:  Negative for chills and fever.  Eyes:  Negative for visual disturbance.  Respiratory:  Negative for shortness of breath and wheezing.   Cardiovascular:  Negative for chest pain and leg swelling.  Musculoskeletal:  Negative for arthralgias, back pain and gait problem.  Skin:  Negative for rash.  All other systems reviewed and are negative.  Per HPI unless specifically indicated  above   Allergies as of 05/20/2021   No Known Allergies      Medication List        Accurate as of May 20, 2021  9:36 AM. If you have any questions, ask your nurse or doctor.          Accu-Chek Aviva Plus test strip Generic drug: glucose blood 1 each by Other route 2 (two) times daily. CHECK BLOOD SUGAR ONCE A DAY OR AS DIRECTED Dx E11.9   Accu-Chek Aviva Plus w/Device Kit 1 Device by Does not apply route 2 (two) times daily at 10 AM and 5 PM.   aspirin EC 81 MG tablet Take 81 mg by mouth daily.   atorvastatin 10 MG tablet Commonly known as: LIPITOR Take 1 tablet (10 mg total) by mouth daily.   baclofen 10 MG tablet Commonly known as: LIORESAL Take 1 tablet (10 mg total) by mouth at bedtime as needed for muscle spasms.         Objective:   BP (!) 157/71   Pulse 62   Ht _1  (1.854 m)   Wt 291 lb (132 kg)   SpO2 98%   BMI 38.39 kg/m   Wt Readings from Last 3 Encounters:  05/20/21 291 lb (132 kg)  02/17/21 (!) 305 lb (138.3 kg)  12/25/20 (!) 303 lb 6.4 oz (137.6 kg)    Physical Exam Vitals and nursing note reviewed.  Constitutional:      General: He is not in acute distress.    Appearance: He is well-developed. He is  not diaphoretic.  Eyes:     General: No scleral icterus.    Conjunctiva/sclera: Conjunctivae normal.  Neck:     Thyroid: No thyromegaly.  Cardiovascular:     Rate and Rhythm: Normal rate and regular rhythm.     Heart sounds: Normal heart sounds. No murmur heard. Pulmonary:     Effort: Pulmonary effort is normal. No respiratory distress.     Breath sounds: Normal breath sounds. No wheezing.  Musculoskeletal:        General: No swelling. Normal range of motion.     Cervical back: Neck supple.  Lymphadenopathy:     Cervical: No cervical adenopathy.  Skin:    General: Skin is warm and dry.     Findings: No rash.  Neurological:     Mental Status: He is alert and oriented to person, place, and time.     Coordination:  Coordination normal.  Psychiatric:        Behavior: Behavior normal.      Assessment & Plan:   Problem List Items Addressed This Visit       Cardiovascular and Mediastinum   Hypertension associated with diabetes ([place])   Relevant Orders   CBC with Differential/Platelet   CMP14+EGFR   Lipid panel   PSA, total and free   Bayer DCA Hb A1c Waived     Endocrine   DM (diabetes mellitus) ([place]) - Primary   Relevant Orders   CBC with Differential/Platelet   CMP14+EGFR   Lipid panel   PSA, total and free   Bayer DCA Hb A1c Waived   Microalbumin / creatinine urine ratio   Hyperlipidemia associated with type 2 diabetes mellitus ([facility])   Relevant Orders   CBC with Differential/Platelet   CMP14+EGFR   Lipid panel   PSA, total and free   Bayer DCA Hb A1c Waived    A1c looks good at 6.2, no medication change. Blood pressure tolerable on recheck, would prefer to be slightly lower, he will monitor more closely at home.  Follow up plan: Return in about 3 months (around 08/20/2021), or if symptoms worsen or fail to improve, for dm and htn and hld.  Counseling provided for all of the vaccine components Orders Placed This Encounter  Procedures   CBC with Differential/Platelet   CMP14+EGFR   Lipid panel   PSA, total and free   Bayer DCA Hb A1c Waived   Microalbumin / creatinine urine ratio    Caryl Pina, MD [facility] 05/20/2021, 9:36 AM

## 2021-05-21 LAB — CMP14+EGFR
ALT: 24 IU/L (ref 0–44)
AST: 34 IU/L (ref 0–40)
Albumin/Globulin Ratio: 1.6 (ref 1.2–2.2)
Albumin: 4.2 g/dL (ref 3.7–4.7)
Alkaline Phosphatase: 77 IU/L (ref 44–121)
BUN/Creatinine Ratio: 13 (ref 10–24)
BUN: 14 mg/dL (ref 8–27)
Bilirubin Total: 0.9 mg/dL (ref 0.0–1.2)
CO2: 24 mmol/L (ref 20–29)
Calcium: 9.6 mg/dL (ref 8.6–10.2)
Chloride: 100 mmol/L (ref 96–106)
Creatinine, Ser: 1.04 mg/dL (ref 0.76–1.27)
Globulin, Total: 2.7 g/dL (ref 1.5–4.5)
Glucose: 132 mg/dL — ABNORMAL HIGH (ref 70–99)
Potassium: 4.6 mmol/L (ref 3.5–5.2)
Sodium: 138 mmol/L (ref 134–144)
Total Protein: 6.9 g/dL (ref 6.0–8.5)
eGFR: 75 mL/min/{1.73_m2} (ref >59)

## 2021-05-21 LAB — MICROALBUMIN / CREATININE URINE RATIO
Creatinine, Urine: 108.3 mg/dL
Microalb/Creat Ratio: 57 mg/g creat — ABNORMAL HIGH (ref 0–29)
Microalbumin, Urine: 61.8 ug/mL

## 2021-05-21 LAB — LIPID PANEL
Chol/HDL Ratio: 3.5 ratio (ref 0.0–5.0)
Cholesterol, Total: 145 mg/dL (ref 100–199)
HDL: 42 mg/dL (ref >39)
LDL Chol Calc (NIH): 83 mg/dL (ref 0–99)
Triglycerides: 106 mg/dL (ref 0–149)
VLDL Cholesterol Cal: 20 mg/dL (ref 5–40)

## 2021-05-21 LAB — CBC WITH DIFFERENTIAL/PLATELET
Basophils Absolute: 0 10*3/uL (ref 0.0–0.2)
Basos: 1 %
EOS (ABSOLUTE): 0.3 10*3/uL (ref 0.0–0.4)
Eos: 4 %
Hematocrit: 43.5 % (ref 37.5–51.0)
Hemoglobin: 14.4 g/dL (ref 13.0–17.7)
Immature Grans (Abs): 0 10*3/uL (ref 0.0–0.1)
Immature Granulocytes: 0 %
Lymphocytes Absolute: 2.1 10*3/uL (ref 0.7–3.1)
Lymphs: 29 %
MCH: 29.9 pg (ref 26.6–33.0)
MCHC: 33.1 g/dL (ref 31.5–35.7)
MCV: 90 fL (ref 79–97)
Monocytes Absolute: 0.6 10*3/uL (ref 0.1–0.9)
Monocytes: 8 %
Neutrophils Absolute: 4.3 10*3/uL (ref 1.4–7.0)
Neutrophils: 58 %
Platelets: 289 10*3/uL (ref 150–450)
RBC: 4.82 x10E6/uL (ref 4.14–5.80)
RDW: 12.9 % (ref 11.6–15.4)
WBC: 7.3 10*3/uL (ref 3.4–10.8)

## 2021-05-21 LAB — PSA, TOTAL AND FREE
PSA, Free Pct: 35 %
PSA, Free: 0.21 ng/mL
Prostate Specific Ag, Serum: 0.6 ng/mL (ref 0.0–4.0)

## 2021-05-24 NOTE — Progress Notes (Signed)
Returning nurse call.

## 2021-05-25 ENCOUNTER — Telehealth: Payer: Self-pay | Admitting: Family Medicine

## 2021-05-25 NOTE — Telephone Encounter (Signed)
Wrong pt

## 2021-05-25 NOTE — Telephone Encounter (Signed)
Pt calling to check on Ozempic. Last refill was 3 months ago and he finished the medicine last week. Please call back.  

## 2021-06-01 NOTE — Progress Notes (Signed)
Pt returning call about labs. Please call back at [phone]

## 2021-06-04 ENCOUNTER — Encounter: Payer: Self-pay | Admitting: Family Medicine

## 2021-06-23 ENCOUNTER — Ambulatory Visit: Payer: Medicare PPO | Admitting: Family Medicine

## 2021-07-21 DIAGNOSIS — C44319 Basal cell carcinoma of skin of other parts of face: Secondary | ICD-10-CM | POA: Diagnosis not present

## 2021-07-21 DIAGNOSIS — X32XXXA Exposure to sunlight, initial encounter: Secondary | ICD-10-CM | POA: Diagnosis not present

## 2021-07-21 DIAGNOSIS — B078 Other viral warts: Secondary | ICD-10-CM | POA: Diagnosis not present

## 2021-07-21 DIAGNOSIS — L570 Actinic keratosis: Secondary | ICD-10-CM | POA: Diagnosis not present

## 2021-08-25 ENCOUNTER — Ambulatory Visit: Payer: Medicare PPO | Admitting: Family Medicine

## 2021-08-25 ENCOUNTER — Encounter: Payer: Self-pay | Admitting: Family Medicine

## 2021-08-25 VITALS — BP 144/64 | HR 61 | Ht 73.0 in | Wt 287.0 lb

## 2021-08-25 DIAGNOSIS — Z23 Encounter for immunization: Secondary | ICD-10-CM | POA: Diagnosis not present

## 2021-08-25 DIAGNOSIS — E785 Hyperlipidemia, unspecified: Secondary | ICD-10-CM | POA: Diagnosis not present

## 2021-08-25 DIAGNOSIS — E1159 Type 2 diabetes mellitus with other circulatory complications: Secondary | ICD-10-CM

## 2021-08-25 DIAGNOSIS — E1169 Type 2 diabetes mellitus with other specified complication: Secondary | ICD-10-CM | POA: Diagnosis not present

## 2021-08-25 DIAGNOSIS — I152 Hypertension secondary to endocrine disorders: Secondary | ICD-10-CM

## 2021-08-25 LAB — BAYER DCA HB A1C WAIVED: HB A1C (BAYER DCA - WAIVED): 6.9 % — ABNORMAL HIGH (ref 4.8–5.6)

## 2021-08-25 MED ORDER — PIOGLITAZONE HCL 30 MG PO TABS: 30.0000 mg | ORAL_TABLET | Freq: Every day | ORAL | 1 refills | Status: DC

## 2021-08-25 NOTE — Addendum Note (Signed)
Addended by: Dorene Sorrow on: 08/25/2021 08:36 AM   Modules accepted: Orders

## 2021-08-25 NOTE — Progress Notes (Signed)
BP (!) 144/64    Pulse 61    Ht _0  (1.854 m)    Wt 287 lb (130.2 kg)    SpO2 96%    BMI 37.87 kg/m    Subjective:   Patient ID: Brett Jensen, male    DOB: 1945-11-14, 76 y.o.   MRN: [id number]  HPI: Brett Jensen is a 76 y.o. male presenting on 08/25/2021 for Medical Management of Chronic Issues, Hyperlipidemia, and Diabetes   HPI Type 2 diabetes mellitus Patient comes in today for recheck of his diabetes. Patient has been currently taking no medication, A1c is crept up at 6.9.. Patient is not currently on an ACE inhibitor/ARB. Patient has not seen an ophthalmologist this year. Patient denies any issues with their feet. The symptom started onset as an adult hypertension and hyperlipidemia ARE RELATED TO DM   Hypertension Patient is currently on no medication currently, and their blood pressure today is 144/64. Patient denies any lightheadedness or dizziness. Patient denies headaches, blurred vision, chest pains, shortness of breath, or weakness. Denies any side effects from medication and is content with current medication.   Hyperlipidemia Patient is coming in for recheck of his hyperlipidemia. The patient is currently taking no medication currently, stopped his atorvastatin. They deny any issues with myalgias or history of liver damage from it. They deny any focal numbness or weakness or chest pain.   Relevant past medical, surgical, family and social history reviewed and updated as indicated. Interim medical history since our last visit reviewed. Allergies and medications reviewed and updated.  Review of Systems  Constitutional:  Negative for chills and fever.  Eyes:  Negative for visual disturbance.  Respiratory:  Negative for shortness of breath and wheezing.   Cardiovascular:  Negative for chest pain and leg swelling.  Musculoskeletal:  Negative for back pain and gait problem.  Skin:  Negative for rash.  Neurological:  Negative for dizziness, weakness and numbness.  All  other systems reviewed and are negative.  Per HPI unless specifically indicated above   Allergies as of 08/25/2021   No Known Allergies      Medication List        Accurate as of August 25, 2021  8:25 AM. If you have any questions, ask your nurse or doctor.          STOP taking these medications    atorvastatin 10 MG tablet Commonly known as: LIPITOR Stopped by: Worthy Rancher, MD   baclofen 10 MG tablet Commonly known as: LIORESAL Stopped by: Fransisca Kaufmann Rhylee Nunn, MD       TAKE these medications    Accu-Chek Aviva Plus test strip Generic drug: glucose blood 1 each by Other route 2 (two) times daily. CHECK BLOOD SUGAR ONCE A DAY OR AS DIRECTED Dx E11.9   Accu-Chek Aviva Plus w/Device Kit 1 Device by Does not apply route 2 (two) times daily at 10 AM and 5 PM.   aspirin EC 81 MG tablet Take 81 mg by mouth daily.   pioglitazone 30 MG tablet Commonly known as: Actos Take 1 tablet (30 mg total) by mouth daily. Started by: Fransisca Kaufmann Shanvi Moyd, MD         Objective:   BP (!) 144/64    Pulse 61    Ht _1  (1.854 m)    Wt 287 lb (130.2 kg)    SpO2 96%    BMI 37.87 kg/m   Wt Readings from Last 3 Encounters:  08/25/21 287  lb (130.2 kg)  05/20/21 291 lb (132 kg)  02/17/21 (!) 305 lb (138.3 kg)    Physical Exam Vitals and nursing note reviewed.  Constitutional:      General: He is not in acute distress.    Appearance: He is well-developed. He is not diaphoretic.  Eyes:     General: No scleral icterus.    Conjunctiva/sclera: Conjunctivae normal.  Neck:     Thyroid: No thyromegaly.  Cardiovascular:     Rate and Rhythm: Normal rate and regular rhythm.     Heart sounds: Normal heart sounds. No murmur heard. Pulmonary:     Effort: Pulmonary effort is normal. No respiratory distress.     Breath sounds: Normal breath sounds. No wheezing.  Musculoskeletal:        General: No swelling. Normal range of motion.     Cervical back: Neck supple.   Lymphadenopathy:     Cervical: No cervical adenopathy.  Skin:    General: Skin is warm and dry.     Findings: No rash.  Neurological:     Mental Status: He is alert and oriented to person, place, and time.     Coordination: Coordination normal.  Psychiatric:        Behavior: Behavior normal.      Assessment & Plan:   Problem List Items Addressed This Visit       Cardiovascular and Mediastinum   Hypertension associated with diabetes ([place])   Relevant Medications   pioglitazone (ACTOS) 30 MG tablet     Endocrine   DM (diabetes mellitus) ([facility]) - Primary   Relevant Medications   pioglitazone (ACTOS) 30 MG tablet   Other Relevant Orders   Bayer DCA Hb A1c Waived   Hyperlipidemia associated with type 2 diabetes mellitus ([facility])   Relevant Medications   pioglitazone (ACTOS) 30 MG tablet    A1c 6.9, has crept up, patient has elected to restart the medicine, did discuss diet and lifestyle changes the option but patient did elect to restart Actos. Follow up plan: Return in about 6 months (around 02/22/2022), or if symptoms worsen or fail to improve, for Physical and Dm .  Counseling provided for all of the vaccine components Orders Placed This Encounter  Procedures   Bayer [place] Hb A1c [place] Anysha Frappier, MD [facility] 08/25/2021, 8:25 AM

## 2021-09-02 DIAGNOSIS — L570 Actinic keratosis: Secondary | ICD-10-CM | POA: Diagnosis not present

## 2021-09-02 DIAGNOSIS — Z08 Encounter for follow-up examination after completed treatment for malignant neoplasm: Secondary | ICD-10-CM | POA: Diagnosis not present

## 2021-09-02 DIAGNOSIS — Z85828 Personal history of other malignant neoplasm of skin: Secondary | ICD-10-CM | POA: Diagnosis not present

## 2021-09-02 DIAGNOSIS — X32XXXD Exposure to sunlight, subsequent encounter: Secondary | ICD-10-CM | POA: Diagnosis not present

## 2021-09-02 DIAGNOSIS — L218 Other seborrheic dermatitis: Secondary | ICD-10-CM | POA: Diagnosis not present

## 2021-11-29 ENCOUNTER — Encounter: Payer: Self-pay | Admitting: Family Medicine

## 2021-11-29 ENCOUNTER — Ambulatory Visit (INDEPENDENT_AMBULATORY_CARE_PROVIDER_SITE_OTHER): Payer: Medicare PPO | Admitting: Family Medicine

## 2021-11-29 VITALS — BP 163/62 | HR 58 | Ht 73.0 in | Wt 288.0 lb

## 2021-11-29 DIAGNOSIS — M791 Myalgia, unspecified site: Secondary | ICD-10-CM | POA: Diagnosis not present

## 2021-11-29 DIAGNOSIS — E1169 Type 2 diabetes mellitus with other specified complication: Secondary | ICD-10-CM

## 2021-11-29 DIAGNOSIS — I152 Hypertension secondary to endocrine disorders: Secondary | ICD-10-CM | POA: Diagnosis not present

## 2021-11-29 DIAGNOSIS — E785 Hyperlipidemia, unspecified: Secondary | ICD-10-CM

## 2021-11-29 DIAGNOSIS — T466X5A Adverse effect of antihyperlipidemic and antiarteriosclerotic drugs, initial encounter: Secondary | ICD-10-CM

## 2021-11-29 DIAGNOSIS — Z0001 Encounter for general adult medical examination with abnormal findings: Secondary | ICD-10-CM

## 2021-11-29 DIAGNOSIS — M5431 Sciatica, right side: Secondary | ICD-10-CM | POA: Diagnosis not present

## 2021-11-29 DIAGNOSIS — Z23 Encounter for immunization: Secondary | ICD-10-CM | POA: Diagnosis not present

## 2021-11-29 DIAGNOSIS — Z0000 Encounter for general adult medical examination without abnormal findings: Secondary | ICD-10-CM

## 2021-11-29 DIAGNOSIS — E1159 Type 2 diabetes mellitus with other circulatory complications: Secondary | ICD-10-CM

## 2021-11-29 LAB — BAYER DCA HB A1C WAIVED: HB A1C (BAYER DCA - WAIVED): 6.1 % — ABNORMAL HIGH (ref 4.8–5.6)

## 2021-11-29 MED ORDER — METHYLPREDNISOLONE ACETATE 80 MG/ML IJ SUSP
80.0000 mg | Freq: Once | INTRAMUSCULAR | Status: AC
  Administered 2021-11-29: 80 mg via INTRAMUSCULAR

## 2021-11-29 MED ORDER — LISINOPRIL 5 MG PO TABS: 5.0000 mg | ORAL_TABLET | Freq: Every day | ORAL | 3 refills | Status: DC

## 2021-11-29 NOTE — Progress Notes (Signed)
? ?BP (!) 163/62   Pulse (!) 58   Ht '6\' 1"'  (1.854 m)   Wt 288 lb (130.6 kg)   SpO2 100%   BMI 38.00 kg/m?   ? ?Subjective:  ? ?Patient ID: Brett Jensen, male    DOB: 19-Aug-1945, 76 y.o.   MRN: [id number] ? ?HPI: ?Brett Jensen is a 76 y.o. male presenting on 11/29/2021 for Medical Management of Chronic Issues, Diabetes, and Hyperlipidemia ? ? ?HPI ?Physical exam ?Patient is coming in today for physical exam recheck chronic medical issues.  He says only real issue that he is having today is he is having some right posterior hip pain that goes down the back of his leg like a sciatic nerve, has had this before over the past 10 years and had an injection and it went away for a while.  He says over the past few months it has been coming back. ? ?Type 2 diabetes mellitus ?Patient comes in today for recheck of his diabetes. Patient has been currently taking Actos. Patient is not currently on an ACE inhibitor/ARB. Patient has not seen an ophthalmologist this year. Patient denies any issues with their feet. The symptom started onset as an adult hypertension and hyperlipidemia ARE RELATED TO DM  ? ?Hypertension ?Patient is currently on no medicine currently, and their blood pressure today is 163/62 and he says it is running about the high at home as well.. Patient denies any lightheadedness or dizziness. Patient denies headaches, blurred vision, chest pains, shortness of breath, or weakness. Denies any side effects from medication and is content with current medication.  ? ?Hyperlipidemia ?Patient is coming in for recheck of his hyperlipidemia. The patient is currently taking no medicine, has been intolerant of statins.. They deny any issues with myalgias or history of liver damage from it. They deny any focal numbness or weakness or chest pain.  ? ?Relevant past medical, surgical, family and social history reviewed and updated as indicated. Interim medical history since our last visit reviewed. ?Allergies and medications  reviewed and updated. ? ?Review of Systems  ?Constitutional:  Negative for chills and fever.  ?Eyes:  Negative for visual disturbance.  ?Respiratory:  Negative for shortness of breath and wheezing.   ?Cardiovascular:  Negative for chest pain and leg swelling.  ?Musculoskeletal:  Positive for arthralgias and myalgias. Negative for back pain and gait problem.  ?Skin:  Negative for rash.  ?Neurological:  Negative for dizziness, weakness and light-headedness.  ?All other systems reviewed and are negative. ? ?Per HPI unless specifically indicated above ? ? ?Allergies as of 11/29/2021   ?No Known Allergies ?  ? ?  ?Medication List  ?  ? ?  ? Accurate as of Nov 29, 2021 10:47 AM. If you have any questions, ask your nurse or doctor.  ?  ?  ? ?  ? ?Accu-Chek Aviva Plus test strip ?Generic drug: glucose blood ?1 each by Other route 2 (two) times daily. CHECK BLOOD SUGAR ONCE A DAY OR AS DIRECTED Dx E11.9 ?  ?Accu-Chek Aviva Plus w/Device Kit ?1 Device by Does not apply route 2 (two) times daily at 10 AM and 5 PM. ?  ?aspirin EC 81 MG tablet ?Take 81 mg by mouth daily. ?  ?lisinopril 5 MG tablet ?Commonly known as: ZESTRIL ?Take 1 tablet (5 mg total) by mouth daily. ?Started by: Worthy Rancher, MD ?  ?pioglitazone 30 MG tablet ?Commonly known as: Actos ?Take 1 tablet (30 mg total) by mouth daily. ?  ? ?  ? ? ? ?  Objective:  ? ?BP (!) 163/62   Pulse (!) 58   Ht '6\' 1"'  (1.854 m)   Wt 288 lb (130.6 kg)   SpO2 100%   BMI 38.00 kg/m?   ?Wt Readings from Last 3 Encounters:  ?11/29/21 288 lb (130.6 kg)  ?08/25/21 287 lb (130.2 kg)  ?05/20/21 291 lb (132 kg)  ?  ?Physical Exam ?Vitals and nursing note reviewed.  ?Constitutional:   ?   General: He is not in acute distress. ?   Appearance: He is well-developed. He is not diaphoretic.  ?Eyes:  ?   General: No scleral icterus.    ?   Right eye: No discharge.  ?   Conjunctiva/sclera: Conjunctivae normal.  ?   Pupils: Pupils are equal, round, and reactive to light.  ?Neck:  ?   Thyroid:  No thyromegaly.  ?Cardiovascular:  ?   Rate and Rhythm: Normal rate and regular rhythm.  ?   Heart sounds: Normal heart sounds. No murmur heard. ?Pulmonary:  ?   Effort: Pulmonary effort is normal. No respiratory distress.  ?   Breath sounds: Normal breath sounds. No wheezing.  ?Musculoskeletal:     ?   General: Normal range of motion.  ?   Cervical back: Neck supple.  ?Lymphadenopathy:  ?   Cervical: No cervical adenopathy.  ?Skin: ?   General: Skin is warm and dry.  ?   Findings: No rash.  ?Neurological:  ?   Mental Status: He is alert and oriented to person, place, and time.  ?   Coordination: Coordination normal.  ?Psychiatric:     ?   Behavior: Behavior normal.  ? ? ? ? ?Assessment & Plan:  ? ?Problem List Items Addressed This Visit   ? ?  ? Cardiovascular and Mediastinum  ? Hypertension associated with diabetes ([place])  ? Relevant Medications  ? lisinopril (ZESTRIL) 5 MG tablet  ? Other Relevant Orders  ? CBC with Differential/Platelet  ? CMP14+EGFR  ? Lipid panel  ? Bayer DCA Hb A1c Waived  ?  ? Endocrine  ? DM (diabetes mellitus) ([place])  ? Relevant Medications  ? lisinopril (ZESTRIL) 5 MG tablet  ? Other Relevant Orders  ? CBC with Differential/Platelet  ? CMP14+EGFR  ? Lipid panel  ? Bayer DCA Hb A1c Waived  ? Hyperlipidemia associated with type 2 diabetes mellitus ([place])  ? Relevant Medications  ? lisinopril (ZESTRIL) 5 MG tablet  ? Other Relevant Orders  ? CBC with Differential/Platelet  ? CMP14+EGFR  ? Lipid panel  ? Bayer DCA Hb A1c Waived  ?  ? Other  ? Myalgia due to statin  ? ?Other Visit Diagnoses   ? ? Physical exam    -  Primary  ? Need for shingles vaccine      ? Relevant Orders  ? Varicella-zoster vaccine IM (Shingrix) (Completed)  ? Right sciatic nerve pain      ? Relevant Medications  ? methylPREDNISolone acetate (DEPO-MEDROL) injection 80 mg (Start on 11/29/2021 11:00 AM)  ? ?  ?  ?A1c 6.1, looks good. ? ?Right-sided hip pain, will give steroid injection to see if it helps calm it down. ? ?Blood  pressure elevated, will start a low-dose lisinopril 5 mg because of previous intolerance. ? ?Patient has had myalgias due to statins ?Follow up plan: ?Return in about 3 months (around 03/01/2022), or if symptoms worsen or fail to improve, for Diabetes recheck and cholesterol. ? ?Counseling provided for all of the vaccine components ?Orders Placed  This Encounter  ?Procedures  ? Varicella-zoster vaccine IM (Shingrix)  ? CBC with Differential/Platelet  ? CMP14+EGFR  ? Lipid panel  ? Bayer DCA Hb A1c Waived  ? ? ?Caryl Pina, MD ?[place] ?11/29/2021, 10:47 AM ? ? ? ? ?

## 2021-11-30 LAB — CMP14+EGFR
ALT: 19 IU/L (ref 0–44)
AST: 24 IU/L (ref 0–40)
Albumin/Globulin Ratio: 1.3 (ref 1.2–2.2)
Albumin: 4.1 g/dL (ref 3.7–4.7)
Alkaline Phosphatase: 69 IU/L (ref 44–121)
BUN/Creatinine Ratio: 16 (ref 10–24)
BUN: 16 mg/dL (ref 8–27)
Bilirubin Total: 0.9 mg/dL (ref 0.0–1.2)
CO2: 25 mmol/L (ref 20–29)
Calcium: 9.6 mg/dL (ref 8.6–10.2)
Chloride: 102 mmol/L (ref 96–106)
Creatinine, Ser: 1.03 mg/dL (ref 0.76–1.27)
Globulin, Total: 3.1 g/dL (ref 1.5–4.5)
Glucose: 113 mg/dL — ABNORMAL HIGH (ref 70–99)
Potassium: 4.9 mmol/L (ref 3.5–5.2)
Sodium: 139 mmol/L (ref 134–144)
Total Protein: 7.2 g/dL (ref 6.0–8.5)
eGFR: 76 mL/min/{1.73_m2} (ref >59)

## 2021-11-30 LAB — LIPID PANEL
Chol/HDL Ratio: 3.8 ratio (ref 0.0–5.0)
Cholesterol, Total: 177 mg/dL (ref 100–199)
HDL: 46 mg/dL (ref >39)
LDL Chol Calc (NIH): 111 mg/dL — ABNORMAL HIGH (ref 0–99)
Triglycerides: 110 mg/dL (ref 0–149)
VLDL Cholesterol Cal: 20 mg/dL (ref 5–40)

## 2021-11-30 LAB — CBC WITH DIFFERENTIAL/PLATELET
Basophils Absolute: 0 10*3/uL (ref 0.0–0.2)
Basos: 1 %
EOS (ABSOLUTE): 0.3 10*3/uL (ref 0.0–0.4)
Eos: 4 %
Hematocrit: 42.2 % (ref 37.5–51.0)
Hemoglobin: 14 g/dL (ref 13.0–17.7)
Immature Grans (Abs): 0 10*3/uL (ref 0.0–0.1)
Immature Granulocytes: 0 %
Lymphocytes Absolute: 2.1 10*3/uL (ref 0.7–3.1)
Lymphs: 31 %
MCH: 29.4 pg (ref 26.6–33.0)
MCHC: 33.2 g/dL (ref 31.5–35.7)
MCV: 89 fL (ref 79–97)
Monocytes Absolute: 0.6 10*3/uL (ref 0.1–0.9)
Monocytes: 9 %
Neutrophils Absolute: 3.8 10*3/uL (ref 1.4–7.0)
Neutrophils: 55 %
Platelets: 250 10*3/uL (ref 150–450)
RBC: 4.77 x10E6/uL (ref 4.14–5.80)
RDW: 13.1 % (ref 11.6–15.4)
WBC: 6.8 10*3/uL (ref 3.4–10.8)

## 2021-12-30 ENCOUNTER — Ambulatory Visit (INDEPENDENT_AMBULATORY_CARE_PROVIDER_SITE_OTHER): Payer: Medicare PPO

## 2021-12-30 VITALS — Wt 285.0 lb

## 2021-12-30 DIAGNOSIS — Z0000 Encounter for general adult medical examination without abnormal findings: Secondary | ICD-10-CM

## 2021-12-30 NOTE — Patient Instructions (Signed)
[name] , Thank you for taking time to come for your Medicare Wellness Visit. I appreciate your ongoing commitment to your health goals. Please review the following plan we discussed and let me know if I can assist you in the future.   Screening recommendations/referrals: Colonoscopy: Done 02/14/2013 - Repeat in 10 years Recommended yearly ophthalmology/optometry visit for glaucoma screening and checkup Recommended yearly dental visit for hygiene and checkup  Vaccinations: Influenza vaccine: Done 05/20/2021 - Repeat annually Pneumococcal vaccine: Done  04/02/2015 & 07/05/2017      Tdap vaccine: Done 01/03/2018 - Repeat in 10 years Shingles vaccine: Done 08/25/2021 & 11/29/2021   Covid-19: Done  09/11/2019, 10/09/2019, 05/22/2020, & 11/12/2020  Advanced directives: Please bring a copy of your health care power of attorney and living will to the office to be added to your chart at your convenience.   Conditions/risks identified: Keep up the great work staying active! Aim for 30 minutes of exercise or brisk walking, 6-8 glasses of water, and 5 servings of fruits and vegetables each day.   Next appointment: Follow up in one year for your annual wellness visit.   Preventive Care 11 Years and Older, Male  Preventive care refers to lifestyle choices and visits with your health care provider that can promote health and wellness. What does preventive care include? A yearly physical exam. This is also called an annual well check. Dental exams once or twice a year. Routine eye exams. Ask your health care provider how often you should have your eyes checked. Personal lifestyle choices, including: Daily care of your teeth and gums. Regular physical activity. Eating a healthy diet. Avoiding tobacco and drug use. Limiting alcohol use. Practicing safe sex. Taking low doses of aspirin every day. Taking vitamin and mineral supplements as recommended by your health care provider. What happens during an  annual well check? The services and screenings done by your health care provider during your annual well check will depend on your age, overall health, lifestyle risk factors, and family history of disease. Counseling  Your health care provider may ask you questions about your: Alcohol use. Tobacco use. Drug use. Emotional well-being. Home and relationship well-being. Sexual activity. Eating habits. History of falls. Memory and ability to understand (cognition). Work and work Statistician. Screening  You may have the following tests or measurements: Height, weight, and BMI. Blood pressure. Lipid and cholesterol levels. These may be checked every 5 years, or more frequently if you are over 25 years old. Skin check. Lung cancer screening. You may have this screening every year starting at age 6 if you have a 30-pack-year history of smoking and currently smoke or have quit within the past 15 years. Fecal occult blood test (FOBT) of the stool. You may have this test every year starting at age 18. Flexible sigmoidoscopy or colonoscopy. You may have a sigmoidoscopy every 5 years or a colonoscopy every 10 years starting at age 49. Prostate cancer screening. Recommendations will vary depending on your family history and other risks. Hepatitis C blood test. Hepatitis B blood test. Sexually transmitted disease (STD) testing. Diabetes screening. This is done by checking your blood sugar (glucose) after you have not eaten for a while (fasting). You may have this done every 1-3 years. Abdominal aortic aneurysm (AAA) screening. You may need this if you are a current or former smoker. Osteoporosis. You may be screened starting at age 50 if you are at high risk. Talk with your health care provider about your test results,  treatment options, and if necessary, the need for more tests. Vaccines  Your health care provider may recommend certain vaccines, such as: Influenza vaccine. This is recommended  every year. Tetanus, diphtheria, and acellular pertussis (Tdap, Td) vaccine. You may need a Td booster every 10 years. Zoster vaccine. You may need this after age 58. Pneumococcal 13-valent conjugate (PCV13) vaccine. One dose is recommended after age 18. Pneumococcal polysaccharide (PPSV23) vaccine. One dose is recommended after age 63. Talk to your health care provider about which screenings and vaccines you need and how often you need them. This information is not intended to replace advice given to you by your health care provider. Make sure you discuss any questions you have with your health care provider. Document Released: 08/14/2015 Document Revised: 04/06/2016 Document Reviewed: 05/19/2015 Elsevier Interactive Patient Education  2017 [employer] Prevention in the Home Falls can cause injuries. They can happen to people of all ages. There are many things you can do to make your home safe and to help prevent falls. What can I do on the outside of my home? Regularly fix the edges of walkways and driveways and fix any cracks. Remove anything that might make you trip as you walk through a door, such as a raised step or threshold. Trim any bushes or trees on the path to your home. Use bright outdoor lighting. Clear any walking paths of anything that might make someone trip, such as rocks or tools. Regularly check to see if handrails are loose or broken. Make sure that both sides of any steps have handrails. Any raised decks and porches should have guardrails on the edges. Have any leaves, snow, or ice cleared regularly. Use sand or salt on walking paths during winter. Clean up any spills in your garage right away. This includes oil or grease spills. What can I do in the bathroom? Use night lights. Install grab bars by the toilet and in the tub and shower. Do not use towel bars as grab bars. Use non-skid mats or decals in the tub or shower. If you need to sit down in the shower,  use a plastic, non-slip stool. Keep the floor dry. Clean up any water that spills on the floor as soon as it happens. Remove soap buildup in the tub or shower regularly. Attach bath mats securely with double-sided non-slip rug tape. Do not have throw rugs and other things on the floor that can make you trip. What can I do in the bedroom? Use night lights. Make sure that you have a light by your bed that is easy to reach. Do not use any sheets or blankets that are too big for your bed. They should not hang down onto the floor. Have a firm chair that has side arms. You can use this for support while you get dressed. Do not have throw rugs and other things on the floor that can make you trip. What can I do in the kitchen? Clean up any spills right away. Avoid walking on wet floors. Keep items that you use a lot in easy-to-reach places. If you need to reach something above you, use a strong step stool that has a grab bar. Keep electrical cords out of the way. Do not use floor polish or wax that makes floors slippery. If you must use wax, use non-skid floor wax. Do not have throw rugs and other things on the floor that can make you trip. What can I do with my stairs? Do  not leave any items on the stairs. Make sure that there are handrails on both sides of the stairs and use them. Fix handrails that are broken or loose. Make sure that handrails are as long as the stairways. Check any carpeting to make sure that it is firmly attached to the stairs. Fix any carpet that is loose or worn. Avoid having throw rugs at the top or bottom of the stairs. If you do have throw rugs, attach them to the floor with carpet tape. Make sure that you have a light switch at the top of the stairs and the bottom of the stairs. If you do not have them, ask someone to add them for you. What else can I do to help prevent falls? Wear shoes that: Do not have high heels. Have rubber bottoms. Are comfortable and fit you  well. Are closed at the toe. Do not wear sandals. If you use a stepladder: Make sure that it is fully opened. Do not climb a closed stepladder. Make sure that both sides of the stepladder are locked into place. Ask someone to hold it for you, if possible. Clearly mark and make sure that you can see: Any grab bars or handrails. First and last steps. Where the edge of each step is. Use tools that help you move around (mobility aids) if they are needed. These include: Canes. Walkers. Scooters. Crutches. Turn on the lights when you go into a dark area. Replace any light bulbs as soon as they burn out. Set up your furniture so you have a clear path. Avoid moving your furniture around. If any of your floors are uneven, fix them. If there are any pets around you, be aware of where they are. Review your medicines with your doctor. Some medicines can make you feel dizzy. This can increase your chance of falling. Ask your doctor what other things that you can do to help prevent falls. This information is not intended to replace advice given to you by your health care provider. Make sure you discuss any questions you have with your health care provider. Document Released: 05/14/2009 Document Revised: 12/24/2015 Document Reviewed: 08/22/2014 Elsevier Interactive Patient Education  2017 [employer].

## 2021-12-30 NOTE — Progress Notes (Signed)
Subjective:   Brett Jensen is a 75 y.o. male who presents for Medicare Annual/Subsequent preventive examination.  Virtual Visit via Telephone Note  I connected with  Brett Jensen on 12/30/21 at 12:00 PM EDT by telephone and verified that I am speaking with the correct person using two identifiers.  Location: Patient: Home Provider: [facility] Persons participating in the virtual visit: patient/Nurse Health Advisor   I discussed the limitations, risks, security and privacy concerns of performing an evaluation and management service by telephone and the availability of in person appointments. The patient expressed understanding and agreed to proceed.  Interactive audio and video telecommunications were attempted between this nurse and patient, however failed, due to patient having technical difficulties OR patient did not have access to video capability.  We continued and completed visit with audio only.  Some vital signs may be absent or patient reported.   Amy E Hopkins, LPN   Review of Systems     Cardiac Risk Factors include: advanced age (>27mn, >>79women);diabetes mellitus;dyslipidemia;hypertension;male gender;obesity (BMI >30kg/m2)     Objective:    Today's Vitals   12/30/21 1204  Weight: 285 lb (129.3 kg)   Body mass index is 37.6 kg/m.     12/30/2021   12:13 PM 12/29/2020    9:07 AM 12/26/2019    8:42 AM 12/20/2018   10:56 AM 09/06/2018    2:09 PM 05/11/2015    4:52 PM  Advanced Directives  Does Patient Have a Medical Advance Directive? Yes Yes Yes No No No  Type of [place] Living will Living will     Does patient want to make changes to medical advance directive?   No - Patient declined     Copy of HCarteretin Chart? No - copy requested       Would patient like information on creating a medical advance directive?     No - Patient declined     Current Medications (verified) Outpatient Encounter  Medications as of 12/30/2021  Medication Sig   aspirin EC 81 MG tablet Take 81 mg by mouth daily.   Blood Glucose Monitoring Suppl (ACCU-CHEK AVIVA PLUS) w/Device KIT 1 Device by Does not apply route 2 (two) times daily at 10 AM and 5 PM.   glucose blood (ACCU-CHEK AVIVA PLUS) test strip 1 each by Other route 2 (two) times daily. CHECK BLOOD SUGAR ONCE A DAY OR AS DIRECTED Dx E11.9   lisinopril (ZESTRIL) 5 MG tablet Take 1 tablet (5 mg total) by mouth daily.   pioglitazone (ACTOS) 30 MG tablet Take 1 tablet (30 mg total) by mouth daily.   No facility-administered encounter medications on file as of 12/30/2021.    Allergies (verified) Patient has no known allergies.   History: Past Medical History:  Diagnosis Date   Arthritis    bilateral knees   Diabetes mellitus without complication (HCC)    Erectile dysfunction    Hyperlipidemia    Hypertension    Restless leg syndrome    Shingles    Past Surgical History:  Procedure Laterality Date   JOINT REPLACEMENT Right    ankle   SPINE SURGERY     lower back   Family History  Problem Relation Age of Onset   Cancer Mother        leukemia   Diabetes Mother    Cancer Father        throat    Cancer Sister  back    Diabetes Sister    Diabetes Sister    Hypertension Daughter    Asthma Son    Diabetes Brother    Heart attack Brother    Heart attack Brother    Social History   Socioeconomic History   Marital status: Widowed    Spouse name: Not on file   Number of children: 2   Years of education: Not on file   Highest education level: 7th grade  Occupational History   Occupation: retired     Comment: state of [place]   Tobacco Use   Smoking status: Every Day    Types: Cigars   Smokeless tobacco: Never  Vaping Use   Vaping Use: Never used  Substance and Sexual Activity   Alcohol use: No   Drug use: No   Sexual activity: Not on file  Other Topics Concern   Not on file  Social History Narrative   Lives in his home =  son lives with him   Social Determinants of Health   Financial Resource Strain: Low Risk    Difficulty of Paying Living Expenses: Not hard at all  Food Insecurity: No Food Insecurity   Worried About Charity fundraiser in the Last Year: Never true   Arboriculturist in the Last Year: Never true  Transportation Needs: No Transportation Needs   Lack of Transportation (Medical): No   Lack of Transportation (Non-Medical): No  Physical Activity: Sufficiently Active   Days of Exercise per Week: 5 days   Minutes of Exercise per Session: 60 min  Stress: No Stress Concern Present   Feeling of Stress : Not at all  Social Connections: Moderately Isolated   Frequency of Communication with Friends and Family: More than three times a week   Frequency of Social Gatherings with Friends and Family: More than three times a week   Attends Religious Services: Never   Marine scientist or Organizations: Yes   Attends Music therapist: More than 4 times per year   Marital Status: Widowed    Tobacco Counseling Ready to quit: Not Answered Counseling given: Not Answered   Clinical Intake:  Pre-visit preparation completed: Yes  Pain : No/denies pain     BMI - recorded: 37.6 Nutritional Status: BMI > 30  Obese Nutritional Risks: None Diabetes: Yes CBG done?: No Did pt. bring in CBG monitor from home?: No  How often do you need to have someone help you when you read instructions, pamphlets, or other written materials from your doctor or pharmacy?: 1 - Never  Diabetic? Nutrition Risk Assessment:  Has the patient had any N/V/D within the last 2 months?  No  Does the patient have any non-healing wounds?  No  Has the patient had any unintentional weight loss or weight gain?  No   Diabetes:  Is the patient diabetic?  Yes  If diabetic, was a CBG obtained today?  No  Did the patient bring in their glucometer from home?  No  How often do you monitor your CBG's? Once or twice  per day - 93 this am per patient.   Financial Strains and Diabetes Management:  Are you having any financial strains with the device, your supplies or your medication? No .  Does the patient want to be seen by Chronic Care Management for management of their diabetes?  No  Would the patient like to be referred to a Nutritionist or for Diabetic Management?  No  Diabetic Exams:  Diabetic Eye Exam: Completed 10/19/2020. Overdue for diabetic eye exam. Pt has been advised about the importance in completing this exam. He has an appt in a few weeks  Diabetic Foot Exam: Completed 02/17/2021. Pt has been advised about the importance in completing this exam. Pt is scheduled for diabetic foot exam on 03/03/22.    Interpreter Needed?: No  Information entered by :: Amy Hopkins, LPN   Activities of Daily Living    12/30/2021   12:08 PM  In your present state of health, do you have any difficulty performing the following activities:  Hearing? 0  Vision? 0  Difficulty concentrating or making decisions? 0  Walking or climbing stairs? 0  Dressing or bathing? 0  Doing errands, shopping? 0  Preparing Food and eating ? N  Using the Toilet? N  In the past six months, have you accidently leaked urine? N  Do you have problems with loss of bowel control? N  Managing your Medications? N  Managing your Finances? N  Housekeeping or managing your Housekeeping? N    Patient Care Team: Dettinger, Fransisca Kaufmann, MD as PCP - General (Family Medicine) Ralene Muskrat as Physician Assistant (Chiropractic Medicine) Celestia Khat, [place] (Optometry)  Indicate any recent Medical Services you may have received from other than [facility] providers in the past year (date may be approximate).     Assessment:   This is a routine wellness examination for [place].  Hearing/Vision screen Hearing Screening - Comments:: Denies hearing difficulties   Vision Screening - Comments:: Denies vision difficulties - up to date with routine  eye exams with [facility]  Dietary issues and exercise activities discussed: Current Exercise Habits: Home exercise routine, Type of exercise: walking;calisthenics, Time (Minutes): 30, Frequency (Times/Week): 5, Weekly Exercise (Minutes/Week): 150, Intensity: Mild, Exercise limited by: orthopedic condition(s)   Goals Addressed             This Visit's Progress    Patient Stated   On track    12/30/2021 AWV Goal: Exercise for General Health  Patient will verbalize understanding of the benefits of increased physical activity: Exercising regularly is important. It will improve your overall fitness, flexibility, and endurance. Regular exercise also will improve your overall health. It can help you control your weight, reduce stress, and improve your bone density. Over the next year, patient will increase physical activity as tolerated with a goal of at least 150 minutes of moderate physical activity per week.  You can tell that you are exercising at a moderate intensity if your heart starts beating faster and you start breathing faster but can still hold a conversation. Moderate-intensity exercise ideas include: Walking 1 mile (1.6 km) in about 15 minutes Biking Hiking Golfing Dancing Water aerobics Patient will verbalize understanding of everyday activities that increase physical activity by providing examples like the following: Yard work, such as: Sales promotion account executive Gardening Washing windows or floors Patient will be able to explain general safety guidelines for exercising:  Before you start a new exercise program, talk with your health care provider. Do not exercise so much that you hurt yourself, feel dizzy, or get very short of breath. Wear comfortable clothes and wear shoes with good support. Drink plenty of water while you exercise to prevent dehydration or heat stroke. Work out until your  breathing and your heartbeat get faster.      Prevent falls   On track  Stay active / walk more ( get leg better)        Depression Screen    12/30/2021   12:07 PM 11/29/2021   10:12 AM 08/25/2021    8:05 AM 02/17/2021   10:21 AM 12/29/2020    9:07 AM 05/20/2020    8:16 AM 02/13/2020    2:04 PM  PHQ 2/9 Scores  PHQ - 2 Score 0 0 0 0 0 0 0  PHQ- 9 Score 0 0         Fall Risk    12/30/2021   12:05 PM 11/29/2021   10:12 AM 08/25/2021    8:05 AM 05/20/2021    9:05 AM 02/17/2021   10:20 AM  Fall Risk   Falls in the past year? 0 0 0 0 0  Number falls in past yr: 0      Injury with Fall? 0      Risk for fall due to : Orthopedic patient      Follow up Falls prevention discussed        [place]:  Any stairs in or around the home? No  If so, are there any without handrails? No  Home free of loose throw rugs in walkways, pet beds, electrical cords, etc? Yes  Adequate lighting in your home to reduce risk of falls? Yes   ASSISTIVE DEVICES UTILIZED TO PREVENT FALLS:  Life alert? No  Use of a cane, walker or w/c? No  Grab bars in the bathroom? No  Shower chair or bench in shower? No  Elevated toilet seat or a handicapped toilet? No   TIMED UP AND GO:  Was the test performed? No . Telephonic visit  Cognitive Function:        12/30/2021   12:08 PM 12/29/2020    9:09 AM 12/26/2019    8:44 AM 12/20/2018   10:58 AM  6CIT Screen  What Year? 0 points 0 points 0 points 0 points  What month? 0 points 0 points 0 points 0 points  What time? 0 points 0 points 0 points 0 points  Count back from 20 0 points 2 points 0 points 0 points  Months in reverse 4 points 4 points 2 points 2 points  Repeat phrase 4 points 10 points 4 points 4 points  Total Score 8 points 16 points 6 points 6 points    Immunizations Immunization History  Administered Date(s) Administered   Fluad Quad(high Dose 65+) 05/04/2020, 05/20/2021   Influenza, High Dose Seasonal PF  04/01/2014, 04/27/2018, 04/04/2019   Influenza,inj,Quad PF,6+ Mos 06/24/2013   Influenza-Unspecified 05/04/2020   Moderna SARS-COV2 Booster Vaccination 05/22/2020, 11/12/2020   Moderna Sars-Covid-2 Vaccination 09/11/2019, 10/09/2019   Pneumococcal Conjugate-13 04/02/2015   Pneumococcal Polysaccharide-23 07/05/2017   Tdap 01/03/2018   Zoster Recombinat (Shingrix) 08/25/2021, 11/29/2021    TDAP status: Up to date  Flu Vaccine status: Up to date  Pneumococcal vaccine status: Up to date  Covid-19 vaccine status: Completed vaccines  Qualifies for Shingles Vaccine? Yes   Zostavax completed Yes   Shingrix Completed?: Yes  Screening Tests Health Maintenance  Topic Date Due   COVID-19 Vaccine (3 - Booster for Moderna series) 01/07/2021   OPHTHALMOLOGY EXAM  10/19/2021   FOOT EXAM  02/17/2022   INFLUENZA VACCINE  03/01/2022   HEMOGLOBIN A1C  06/01/2022   COLONOSCOPY (Pts 45-81yr Insurance coverage will need to be confirmed)  02/15/2023   TETANUS/TDAP  01/04/2028   Pneumonia Vaccine 76 Years old  Completed   Hepatitis C Screening  Completed   Zoster Vaccines- Shingrix  Completed   HPV VACCINES  Aged Out    Health Maintenance  Health Maintenance Due  Topic Date Due   COVID-19 Vaccine (3 - Booster for Moderna series) 01/07/2021   OPHTHALMOLOGY EXAM  10/19/2021    Colorectal cancer screening: Type of screening: Colonoscopy. Completed 02/14/2013. Repeat every 10 years  Lung Cancer Screening: (Low Dose CT Chest recommended if Age 3-80 years, 30 pack-year currently smoking OR have quit w/in 15years.) does not qualify.   Additional Screening:  Hepatitis C Screening: does qualify; Completed 01/02/2017  Vision Screening: Recommended annual ophthalmology exams for early detection of glaucoma and other disorders of the eye. Is the patient up to date with their annual eye exam?  Yes  Who is the provider or what is the name of the office in which the patient attends annual eye exams?  [facility] If pt is not established with a provider, would they like to be referred to a provider to establish care? No .   Dental Screening: Recommended annual dental exams for proper oral hygiene  Community Resource Referral / Chronic Care Management: CRR required this visit?  No   CCM required this visit?  No      Plan:     I have personally reviewed and noted the following in the patient's chart:   Medical and social history Use of alcohol, tobacco or illicit drugs  Current medications and supplements including opioid prescriptions. Patient is not currently taking opioid prescriptions. Functional ability and status Nutritional status Physical activity Advanced directives List of other physicians Hospitalizations, surgeries, and ER visits in previous 12 months Vitals Screenings to include cognitive, depression, and falls Referrals and appointments  In addition, I have reviewed and discussed with patient certain preventive protocols, quality metrics, and best practice recommendations. A written personalized care plan for preventive services as well as general preventive health recommendations were provided to patient.     Sandrea Hammond, LPN   12/31/8364   Nurse Notes: None

## 2022-03-03 ENCOUNTER — Ambulatory Visit: Payer: Medicare PPO | Admitting: Family Medicine

## 2022-03-03 ENCOUNTER — Encounter: Payer: Self-pay | Admitting: Family Medicine

## 2022-03-03 ENCOUNTER — Other Ambulatory Visit: Payer: Self-pay | Admitting: Family Medicine

## 2022-03-03 VITALS — BP 138/65 | HR 59 | Temp 97.9°F | Ht 73.0 in | Wt 288.0 lb

## 2022-03-03 DIAGNOSIS — E1159 Type 2 diabetes mellitus with other circulatory complications: Secondary | ICD-10-CM

## 2022-03-03 DIAGNOSIS — M791 Myalgia, unspecified site: Secondary | ICD-10-CM | POA: Diagnosis not present

## 2022-03-03 DIAGNOSIS — I152 Hypertension secondary to endocrine disorders: Secondary | ICD-10-CM | POA: Diagnosis not present

## 2022-03-03 DIAGNOSIS — E1169 Type 2 diabetes mellitus with other specified complication: Secondary | ICD-10-CM

## 2022-03-03 DIAGNOSIS — E785 Hyperlipidemia, unspecified: Secondary | ICD-10-CM | POA: Diagnosis not present

## 2022-03-03 DIAGNOSIS — T466X5A Adverse effect of antihyperlipidemic and antiarteriosclerotic drugs, initial encounter: Secondary | ICD-10-CM

## 2022-03-03 LAB — BAYER DCA HB A1C WAIVED: HB A1C (BAYER DCA - WAIVED): 6.1 % — ABNORMAL HIGH (ref 4.8–5.6)

## 2022-03-03 MED ORDER — ACCU-CHEK AVIVA PLUS VI STRP: 1.0000 | ORAL_STRIP | Freq: Two times a day (BID) | 3 refills | Status: DC

## 2022-03-03 MED ORDER — PIOGLITAZONE HCL 30 MG PO TABS: 30.0000 mg | ORAL_TABLET | Freq: Every day | ORAL | 3 refills | Status: DC

## 2022-03-03 NOTE — Progress Notes (Signed)
BP 138/65   Pulse (!) 59   Temp 97.9 F (36.6 C)   Ht 6' 1" (1.854 m)   Wt 288 lb (130.6 kg)   SpO2 98%   BMI 38.00 kg/m    Subjective:   Patient ID: Brett Jensen, male    DOB: [birth year], 76 y.o.   MRN: [id number]  HPI: Brett Jensen is a 76 y.o. male presenting on 03/03/2022 for Medical Management of Chronic Issues, Diabetes, and Hyperlipidemia   HPI Type 2 diabetes mellitus Patient comes in today for recheck of his diabetes. Patient has been currently taking Actos. Patient is currently on an ACE inhibitor/ARB. Patient has not seen an ophthalmologist this year. Patient denies any issues with their feet. The symptom started onset as an adult hypertension and hyperlipidemia ARE RELATED TO DM   Hypertension Patient is currently on lisinopril, and their blood pressure today is 138/65. Patient denies any lightheadedness or dizziness. Patient denies headaches, blurred vision, chest pains, shortness of breath, or weakness. Denies any side effects from medication and is content with current medication.   Hyperlipidemia Patient is coming in for recheck of his hyperlipidemia. The patient is currently taking no medicine currently, had myalgias due to statins in the past. They deny any issues with myalgias or history of liver damage from it. They deny any focal numbness or weakness or chest pain.   Relevant past medical, surgical, family and social history reviewed and updated as indicated. Interim medical history since our last visit reviewed. Allergies and medications reviewed and updated.  Review of Systems  Constitutional:  Negative for chills and fever.  Eyes:  Negative for visual disturbance.  Respiratory:  Negative for shortness of breath and wheezing.   Cardiovascular:  Negative for chest pain and leg swelling.  Musculoskeletal:  Negative for back pain and gait problem.  Skin:  Negative for rash.  Neurological:  Negative for dizziness, weakness and light-headedness.  All other  systems reviewed and are negative.   Per HPI unless specifically indicated above   Allergies as of 03/03/2022   No Known Allergies      Medication List        Accurate as of March 03, 2022 10:24 AM. If you have any questions, ask your nurse or doctor.          Accu-Chek Aviva Plus test strip Generic drug: glucose blood 1 each by Other route 2 (two) times daily. CHECK BLOOD SUGAR ONCE A DAY OR AS DIRECTED Dx E11.9   Accu-Chek Aviva Plus w/Device Kit 1 Device by Does not apply route 2 (two) times daily at 10 AM and 5 PM.   aspirin EC 81 MG tablet Take 81 mg by mouth daily.   lisinopril 5 MG tablet Commonly known as: ZESTRIL Take 1 tablet (5 mg total) by mouth daily.   pioglitazone 30 MG tablet Commonly known as: Actos Take 1 tablet (30 mg total) by mouth daily.         Objective:   BP 138/65   Pulse (!) 59   Temp 97.9 F (36.6 C)   Ht 6' 1" (1.854 m)   Wt 288 lb (130.6 kg)   SpO2 98%   BMI 38.00 kg/m   Wt Readings from Last 3 Encounters:  03/03/22 288 lb (130.6 kg)  12/30/21 285 lb (129.3 kg)  11/29/21 288 lb (130.6 kg)    Physical Exam Vitals and nursing note reviewed.  Constitutional:      General: He is not in  acute distress.    Appearance: He is well-developed. He is not diaphoretic.  Eyes:     General: No scleral icterus.    Conjunctiva/sclera: Conjunctivae normal.  Neck:     Thyroid: No thyromegaly.  Cardiovascular:     Rate and Rhythm: Normal rate and regular rhythm.     Heart sounds: Normal heart sounds. No murmur heard. Pulmonary:     Effort: Pulmonary effort is normal. No respiratory distress.     Breath sounds: Normal breath sounds. No wheezing.  Musculoskeletal:        General: Swelling (1+ edema bilateral lower extremities) present. Normal range of motion.     Cervical back: Neck supple.  Lymphadenopathy:     Cervical: No cervical adenopathy.  Skin:    General: Skin is warm and dry.     Findings: No rash.  Neurological:      Mental Status: He is alert and oriented to person, place, and time.     Coordination: Coordination normal.  Psychiatric:        Behavior: Behavior normal.       Assessment & Plan:   Problem List Items Addressed This Visit       Cardiovascular and Mediastinum   Hypertension associated with diabetes ([place])   Relevant Medications   pioglitazone (ACTOS) 30 MG tablet     Endocrine   DM (diabetes mellitus) ([facility]) - Primary   Relevant Medications   pioglitazone (ACTOS) 30 MG tablet   Other Relevant Orders   Bayer DCA Hb A1c Waived   Hyperlipidemia associated with type 2 diabetes mellitus ([facility])   Relevant Medications   pioglitazone (ACTOS) 30 MG tablet     Other   Myalgia due to statin    Blood pressure looks good and heart rate sounds good.  A1c is 6.1, looks good.  No change in medication Follow up plan: Return in about 3 months (around 06/03/2022), or if symptoms worsen or fail to improve, for Diabetes hypertension and cholesterol.  Counseling provided for all of the vaccine components Orders Placed This Encounter  Procedures   Bayer [place] Hb A1c Stamford Forrester Blando, MD [facility] 03/03/2022, 10:24 AM

## 2022-04-26 ENCOUNTER — Encounter: Payer: Self-pay | Admitting: *Deleted

## 2022-05-04 ENCOUNTER — Ambulatory Visit: Payer: Medicare PPO | Admitting: Family Medicine

## 2022-05-04 ENCOUNTER — Encounter: Payer: Self-pay | Admitting: Family Medicine

## 2022-05-04 VITALS — BP 137/62 | HR 58 | Temp 98.0°F | Ht 73.0 in | Wt 286.0 lb

## 2022-05-04 DIAGNOSIS — Z23 Encounter for immunization: Secondary | ICD-10-CM

## 2022-05-04 DIAGNOSIS — M4807 Spinal stenosis, lumbosacral region: Secondary | ICD-10-CM

## 2022-05-04 DIAGNOSIS — M5416 Radiculopathy, lumbar region: Secondary | ICD-10-CM

## 2022-05-04 DIAGNOSIS — Z9889 Other specified postprocedural states: Secondary | ICD-10-CM

## 2022-05-04 DIAGNOSIS — M5126 Other intervertebral disc displacement, lumbar region: Secondary | ICD-10-CM | POA: Diagnosis not present

## 2022-05-04 MED ORDER — METHYLPREDNISOLONE ACETATE 80 MG/ML IJ SUSP
80.0000 mg | Freq: Once | INTRAMUSCULAR | Status: AC
  Administered 2022-05-04: 80 mg via INTRAMUSCULAR

## 2022-05-04 NOTE — Progress Notes (Signed)
BP 137/62   Pulse (!) 58   Temp 98 F (36.7 C)   Ht _0  (1.854 m)   Wt 286 lb (129.7 kg)   SpO2 97%   BMI 37.73 kg/m    Subjective:   Patient ID: [name], male    DOB: 1946/01/25, 76 y.o.   MRN: [id number]  HPI: KALEB SEK is a 76 y.o. male presenting on 05/04/2022 for Medical Management of Chronic Issues, Hyperlipidemia, and Diabetes   HPI Numbness and weakness in legs Patient is coming in because of his bilateral lower extremities have been having progressively worsening numbness and weakness and tingling and pain that is been happening more often and instead of just being the right leg like it used to be now it is both legs.  He says when he stands or walks for more than a few minutes he feels like his legs are getting give out he has to sit down because of the numbness and tingling and pain and he feels like he might be getting some weakness in his legs with him giving out as well.  He says it feels like bee stings going down the outside of both of his legs on the upper and lower leg.  He did see a spinal surgeon in 2021- and they did laminectomy and it did help some at the time but now it is worse.  Relevant past medical, surgical, family and social history reviewed and updated as indicated. Interim medical history since our last visit reviewed. Allergies and medications reviewed and updated.  Review of Systems  Constitutional:  Negative for chills and fever.  Eyes:  Negative for visual disturbance.  Respiratory:  Negative for shortness of breath and wheezing.   Cardiovascular:  Negative for chest pain and leg swelling.  Musculoskeletal:  Negative for back pain.  Skin:  Negative for rash.  Neurological:  Positive for weakness and numbness. Negative for dizziness and light-headedness.  All other systems reviewed and are negative.   Per HPI unless specifically indicated above   Allergies as of 05/04/2022   No Known Allergies      Medication List         Accurate as of May 04, 2022  8:24 AM. If you have any questions, ask your nurse or doctor.          Accu-Chek Aviva Plus test strip Generic drug: glucose blood 1 each by Other route 2 (two) times daily. CHECK BLOOD SUGAR ONCE A DAY OR AS DIRECTED Dx E11.9   Accu-Chek Aviva Plus w/Device Kit 1 Device by Does not apply route 2 (two) times daily at 10 AM and 5 PM.   aspirin EC 81 MG tablet Take 81 mg by mouth daily.   lisinopril 5 MG tablet Commonly known as: ZESTRIL Take 1 tablet (5 mg total) by mouth daily.   pioglitazone 30 MG tablet Commonly known as: Actos Take 1 tablet (30 mg total) by mouth daily.         Objective:   BP 137/62   Pulse (!) 58   Temp 98 F (36.7 C)   Ht _1  (1.854 m)   Wt 286 lb (129.7 kg)   SpO2 97%   BMI 37.73 kg/m   Wt Readings from Last 3 Encounters:  05/04/22 286 lb (129.7 kg)  03/03/22 288 lb (130.6 kg)  12/30/21 285 lb (129.3 kg)    Physical Exam Vitals and nursing note reviewed.  Constitutional:  Appearance: Normal appearance.  Musculoskeletal:     Lumbar back: No swelling or tenderness. Normal range of motion. Positive left straight leg raise test. Negative right straight leg raise test.  Neurological:     Mental Status: He is alert.       Assessment & Plan:   Problem List Items Addressed This Visit       Nervous and Auditory   Lumbar radiculopathy - Primary   Relevant Medications   methylPREDNISolone acetate (DEPO-MEDROL) injection 80 mg (Start on 05/04/2022  8:30 AM)   Other Relevant Orders   Ambulatory referral to Neurosurgery   Other Visit Diagnoses     Spinal stenosis of lumbosacral region       Relevant Medications   methylPREDNISolone acetate (DEPO-MEDROL) injection 80 mg (Start on 05/04/2022  8:30 AM)   Other Relevant Orders   Ambulatory referral to Neurosurgery   Status post laminectomy       Relevant Medications   methylPREDNISolone acetate (DEPO-MEDROL) injection 80 mg (Start on 05/04/2022   8:30 AM)   Other Relevant Orders   Ambulatory referral to Neurosurgery   Lumbar herniated disc       Relevant Medications   methylPREDNISolone acetate (DEPO-MEDROL) injection 80 mg (Start on 05/04/2022  8:30 AM)   Other Relevant Orders   Ambulatory referral to Neurosurgery       We will give Depo 80 and refer back to his neurosurgery. Follow up plan: Return if symptoms worsen or fail to improve.  Counseling provided for all of the vaccine components Orders Placed This Encounter  Procedures   Ambulatory referral to Neurosurgery    Caryl Pina, MD [facility] 05/04/2022, 8:24 AM

## 2022-05-26 ENCOUNTER — Ambulatory Visit: Payer: Medicare PPO | Admitting: Family Medicine

## 2022-05-26 ENCOUNTER — Encounter: Payer: Self-pay | Admitting: Family Medicine

## 2022-05-26 VITALS — BP 130/54 | HR 58 | Temp 97.5°F | Ht 73.0 in | Wt 280.0 lb

## 2022-05-26 DIAGNOSIS — E785 Hyperlipidemia, unspecified: Secondary | ICD-10-CM

## 2022-05-26 DIAGNOSIS — M791 Myalgia, unspecified site: Secondary | ICD-10-CM | POA: Diagnosis not present

## 2022-05-26 DIAGNOSIS — I152 Hypertension secondary to endocrine disorders: Secondary | ICD-10-CM | POA: Diagnosis not present

## 2022-05-26 DIAGNOSIS — N529 Male erectile dysfunction, unspecified: Secondary | ICD-10-CM | POA: Diagnosis not present

## 2022-05-26 DIAGNOSIS — E1169 Type 2 diabetes mellitus with other specified complication: Secondary | ICD-10-CM

## 2022-05-26 DIAGNOSIS — E1159 Type 2 diabetes mellitus with other circulatory complications: Secondary | ICD-10-CM | POA: Diagnosis not present

## 2022-05-26 LAB — BAYER DCA HB A1C WAIVED: HB A1C (BAYER DCA - WAIVED): 5.9 % — ABNORMAL HIGH (ref 4.8–5.6)

## 2022-05-26 MED ORDER — SILDENAFIL CITRATE 20 MG PO TABS: 20.0000 mg | ORAL_TABLET | ORAL | 1 refills | Status: DC | PRN

## 2022-05-26 NOTE — Progress Notes (Signed)
BP (!) 130/54   Pulse (!) 58   Temp (!) 97.5 F (36.4 C)   Ht _0  (1.854 m)   Wt 280 lb (127 kg)   SpO2 99%   BMI 36.94 kg/m    Subjective:   Patient ID: Brett Jensen, male    DOB: 01/15/1946, 76 y.o.   MRN: [id number]  HPI: Brett Jensen is a 76 y.o. male presenting on 05/26/2022 for Medical Management of Chronic Issues, Hyperlipidemia, and Diabetes   HPI Type 2 diabetes mellitus Patient comes in today for recheck of his diabetes. Patient has been currently taking Actos. Patient is currently on an ACE inhibitor/ARB. Patient has not seen an ophthalmologist this year. Patient denies any issues with their feet. The symptom started onset as an adult hypertension and hyperlipidemia ARE RELATED TO DM   Hypertension Patient is currently on lisinopril, and their blood pressure today is 130/54. Patient denies any lightheadedness or dizziness. Patient denies headaches, blurred vision, chest pains, shortness of breath, or weakness. Denies any side effects from medication and is content with current medication.   Hyperlipidemia Patient is coming in for recheck of his hyperlipidemia. The patient is currently taking no medication currently, has been intolerant of statins in the past.. They deny any issues with myalgias or history of liver damage from it. They deny any focal numbness or weakness or chest pain.   Relevant past medical, surgical, family and social history reviewed and updated as indicated. Interim medical history since our last visit reviewed. Allergies and medications reviewed and updated.  Review of Systems  Constitutional:  Negative for chills and fever.  HENT:  Negative for ear pain and tinnitus.   Eyes:  Negative for pain and discharge.  Respiratory:  Negative for cough, shortness of breath and wheezing.   Cardiovascular:  Negative for chest pain, palpitations and leg swelling.  Gastrointestinal:  Negative for abdominal pain, blood in stool, constipation and diarrhea.   Genitourinary:  Negative for dysuria and hematuria.  Musculoskeletal:  Negative for back pain, gait problem and myalgias.  Skin:  Negative for rash.  Neurological:  Negative for dizziness, weakness and headaches.  Psychiatric/Behavioral:  Negative for suicidal ideas.   All other systems reviewed and are negative.   Per HPI unless specifically indicated above   Allergies as of 05/26/2022   No Known Allergies      Medication List        Accurate as of May 26, 2022 11:19 AM. If you have any questions, ask your nurse or doctor.          Accu-Chek Aviva Plus test strip Generic drug: glucose blood 1 each by Other route 2 (two) times daily. CHECK BLOOD SUGAR ONCE A DAY OR AS DIRECTED Dx E11.9   Accu-Chek Aviva Plus w/Device Kit 1 Device by Does not apply route 2 (two) times daily at 10 AM and 5 PM.   aspirin EC 81 MG tablet Take 81 mg by mouth daily.   lisinopril 5 MG tablet Commonly known as: ZESTRIL Take 1 tablet (5 mg total) by mouth daily.   pioglitazone 30 MG tablet Commonly known as: Actos Take 1 tablet (30 mg total) by mouth daily.         Objective:   BP (!) 130/54   Pulse (!) 58   Temp (!) 97.5 F (36.4 C)   Ht _1  (1.854 m)   Wt 280 lb (127 kg)   SpO2 99%   BMI 36.94 kg/m  Wt Readings from Last 3 Encounters:  05/26/22 280 lb (127 kg)  05/04/22 286 lb (129.7 kg)  03/03/22 288 lb (130.6 kg)    Physical Exam Vitals and nursing note reviewed.  Constitutional:      General: He is not in acute distress.    Appearance: He is well-developed. He is not diaphoretic.  Eyes:     General: No scleral icterus.       Right eye: No discharge.     Conjunctiva/sclera: Conjunctivae normal.     Pupils: Pupils are equal, round, and reactive to light.  Neck:     Thyroid: No thyromegaly.  Cardiovascular:     Rate and Rhythm: Normal rate and regular rhythm.     Heart sounds: Normal heart sounds. No murmur heard. Pulmonary:     Effort: Pulmonary  effort is normal. No respiratory distress.     Breath sounds: Normal breath sounds. No wheezing.  Musculoskeletal:        General: Normal range of motion.     Cervical back: Neck supple.  Lymphadenopathy:     Cervical: No cervical adenopathy.  Skin:    General: Skin is warm and dry.     Findings: No rash.  Neurological:     Mental Status: He is alert and oriented to person, place, and time.     Coordination: Coordination normal.  Psychiatric:        Behavior: Behavior normal.     Results for orders placed or performed in visit on 03/03/22  Bayer DCA Hb A1c Waived  Result Value Ref Range   HB A1C (BAYER DCA - WAIVED) 6.1 (H) 4.8 - 5.6 %    Assessment & Plan:   Problem List Items Addressed This Visit       Cardiovascular and Mediastinum   Hypertension associated with diabetes ([place])     Endocrine   DM (diabetes mellitus) ([place]) - Primary   Relevant Orders   Bayer DCA Hb A1c Waived   Microalbumin / creatinine urine ratio   Hyperlipidemia associated with type 2 diabetes mellitus ([place])     Other   Myalgia due to statin    A1c looks good at 5.9, no changes, continue current medicine Follow up plan: Return in about 3 months (around 08/26/2022), or if symptoms worsen or fail to improve, for Diabetes recheck.  Counseling provided for all of the vaccine components Orders Placed This Encounter  Procedures   Bayer [place] Hb A1c Waived   Microalbumin / creatinine urine ratio    Caryl Pina, MD [facility] 05/26/2022, 11:19 AM

## 2022-05-26 NOTE — Addendum Note (Signed)
Addended by: Caryl Pina on: 05/26/2022 11:21 AM   Modules accepted: Orders

## 2022-05-28 LAB — MICROALBUMIN / CREATININE URINE RATIO
Creatinine, Urine: 74.8 mg/dL
Microalb/Creat Ratio: 49 mg/g creat — ABNORMAL HIGH (ref 0–29)
Microalbumin, Urine: 36.5 ug/mL

## 2022-06-03 ENCOUNTER — Ambulatory Visit: Payer: Medicare PPO | Admitting: Family Medicine

## 2022-06-03 DIAGNOSIS — M5136 Other intervertebral disc degeneration, lumbar region: Secondary | ICD-10-CM | POA: Diagnosis not present

## 2022-06-03 DIAGNOSIS — R202 Paresthesia of skin: Secondary | ICD-10-CM | POA: Diagnosis not present

## 2022-06-17 DIAGNOSIS — M4316 Spondylolisthesis, lumbar region: Secondary | ICD-10-CM | POA: Diagnosis not present

## 2022-06-17 DIAGNOSIS — R202 Paresthesia of skin: Secondary | ICD-10-CM | POA: Diagnosis not present

## 2022-06-17 DIAGNOSIS — M47817 Spondylosis without myelopathy or radiculopathy, lumbosacral region: Secondary | ICD-10-CM | POA: Diagnosis not present

## 2022-06-17 DIAGNOSIS — M5126 Other intervertebral disc displacement, lumbar region: Secondary | ICD-10-CM | POA: Diagnosis not present

## 2022-06-17 DIAGNOSIS — M47816 Spondylosis without myelopathy or radiculopathy, lumbar region: Secondary | ICD-10-CM | POA: Diagnosis not present

## 2022-07-01 DIAGNOSIS — M4726 Other spondylosis with radiculopathy, lumbar region: Secondary | ICD-10-CM | POA: Diagnosis not present

## 2022-07-01 DIAGNOSIS — R202 Paresthesia of skin: Secondary | ICD-10-CM | POA: Diagnosis not present

## 2022-07-01 DIAGNOSIS — M48061 Spinal stenosis, lumbar region without neurogenic claudication: Secondary | ICD-10-CM | POA: Diagnosis not present

## 2022-07-12 DIAGNOSIS — M48061 Spinal stenosis, lumbar region without neurogenic claudication: Secondary | ICD-10-CM | POA: Diagnosis not present

## 2022-07-12 DIAGNOSIS — R202 Paresthesia of skin: Secondary | ICD-10-CM | POA: Diagnosis not present

## 2022-07-28 DIAGNOSIS — E119 Type 2 diabetes mellitus without complications: Secondary | ICD-10-CM | POA: Diagnosis not present

## 2022-07-28 DIAGNOSIS — M5416 Radiculopathy, lumbar region: Secondary | ICD-10-CM | POA: Diagnosis not present

## 2022-07-28 DIAGNOSIS — F17210 Nicotine dependence, cigarettes, uncomplicated: Secondary | ICD-10-CM | POA: Diagnosis not present

## 2022-07-28 DIAGNOSIS — I10 Essential (primary) hypertension: Secondary | ICD-10-CM | POA: Diagnosis not present

## 2022-07-28 DIAGNOSIS — Z01810 Encounter for preprocedural cardiovascular examination: Secondary | ICD-10-CM | POA: Diagnosis not present

## 2022-07-28 DIAGNOSIS — Z01812 Encounter for preprocedural laboratory examination: Secondary | ICD-10-CM | POA: Diagnosis not present

## 2022-07-28 DIAGNOSIS — Z79899 Other long term (current) drug therapy: Secondary | ICD-10-CM | POA: Diagnosis not present

## 2022-07-28 DIAGNOSIS — Z01818 Encounter for other preprocedural examination: Secondary | ICD-10-CM | POA: Diagnosis not present

## 2022-08-16 DIAGNOSIS — M5416 Radiculopathy, lumbar region: Secondary | ICD-10-CM | POA: Diagnosis not present

## 2022-08-16 DIAGNOSIS — M160 Bilateral primary osteoarthritis of hip: Secondary | ICD-10-CM | POA: Diagnosis not present

## 2022-08-16 DIAGNOSIS — M170 Bilateral primary osteoarthritis of knee: Secondary | ICD-10-CM | POA: Diagnosis not present

## 2022-08-16 DIAGNOSIS — M5117 Intervertebral disc disorders with radiculopathy, lumbosacral region: Secondary | ICD-10-CM | POA: Diagnosis not present

## 2022-08-16 DIAGNOSIS — M4726 Other spondylosis with radiculopathy, lumbar region: Secondary | ICD-10-CM | POA: Diagnosis not present

## 2022-08-16 DIAGNOSIS — M47816 Spondylosis without myelopathy or radiculopathy, lumbar region: Secondary | ICD-10-CM | POA: Diagnosis not present

## 2022-08-16 DIAGNOSIS — E669 Obesity, unspecified: Secondary | ICD-10-CM | POA: Diagnosis not present

## 2022-08-16 DIAGNOSIS — G96198 Other disorders of meninges, not elsewhere classified: Secondary | ICD-10-CM | POA: Diagnosis not present

## 2022-08-16 DIAGNOSIS — M48061 Spinal stenosis, lumbar region without neurogenic claudication: Secondary | ICD-10-CM | POA: Diagnosis not present

## 2022-08-16 DIAGNOSIS — Z6839 Body mass index (BMI) 39.0-39.9, adult: Secondary | ICD-10-CM | POA: Diagnosis not present

## 2022-08-16 DIAGNOSIS — M48062 Spinal stenosis, lumbar region with neurogenic claudication: Secondary | ICD-10-CM | POA: Diagnosis not present

## 2022-08-16 DIAGNOSIS — E119 Type 2 diabetes mellitus without complications: Secondary | ICD-10-CM | POA: Diagnosis not present

## 2022-08-16 DIAGNOSIS — I10 Essential (primary) hypertension: Secondary | ICD-10-CM | POA: Diagnosis not present

## 2022-08-26 ENCOUNTER — Ambulatory Visit: Payer: Medicare PPO | Admitting: Family Medicine

## 2022-09-06 ENCOUNTER — Ambulatory Visit: Payer: Medicare PPO | Attending: Nurse Practitioner | Admitting: Physical Therapy

## 2022-09-06 ENCOUNTER — Other Ambulatory Visit: Payer: Self-pay

## 2022-09-06 DIAGNOSIS — M5459 Other low back pain: Secondary | ICD-10-CM | POA: Insufficient documentation

## 2022-09-06 DIAGNOSIS — M79604 Pain in right leg: Secondary | ICD-10-CM | POA: Insufficient documentation

## 2022-09-06 NOTE — Therapy (Signed)
OUTPATIENT PHYSICAL THERAPY THORACOLUMBAR EVALUATION   Patient Name: Brett Jensen MRN: [id number] DOB:04/12/1946, 77 y.o., male Today's Date: 09/06/2022  END OF SESSION:  PT End of Session - 09/06/22 1243     Visit Number 1    Number of Visits 12    Date for PT Re-Evaluation 10/18/22    Authorization Type FOTO AT LEAST EVERY 5TH VISIT.  PROGRESS NOTE AT 10TH VISIT.  KX MODIFIER AFTER 15 VISITS.    PT Start Time 1028    PT Stop Time 1057    PT Time Calculation (min) 29 min    Activity Tolerance Patient tolerated treatment well    Behavior During Therapy WFL for tasks assessed/performed             Past Medical History:  Diagnosis Date   Arthritis    bilateral knees   Diabetes mellitus without complication ([place])    Erectile dysfunction    Hyperlipidemia    Hypertension    Restless leg syndrome    Shingles    Past Surgical History:  Procedure Laterality Date   JOINT REPLACEMENT Right    ankle   SPINE SURGERY     lower back   Patient Active Problem List   Diagnosis Date Noted   Lumbar radiculopathy 04/02/2020   Myalgia due to statin 01/03/2018   Arthritis    Hyperlipidemia associated with type 2 diabetes mellitus ([place])    Morbid obesity ([place]) 02/14/2013   Erectile dysfunction 02/14/2013   Hypertension associated with diabetes ([place]) 11/09/2010   DM (diabetes mellitus) ([place]) 11/09/2010     REFERRING PROVIDER: Garlan Fillers FNP.  REFERRING DIAG: Spinal stenosis of lumbar region without neurogenic claudication s/p revision of bilateral L4-5 laminectomy.    Rationale for Evaluation and Treatment: Rehabilitation  THERAPY DIAG:  Other low back pain  Pain in right leg  ONSET DATE: Ongoing.  Surgery 08/16/22.   SUBJECTIVE:                                                                                                                                                                                           SUBJECTIVE STATEMENT: The patient presents to the  clinic s/p revision of bilateral L4-5 laminectomy performed on 08/16/22.  He states he has continued c/o right LE pain into his right buttock, posterior thigh and into the region of his tibialis anterior.  He rates his pain at a 4 and can rise to a 5-6/10 with increased walking and going from sit to stand.  He also describes the symptoms as numbness.  Patient states he recently had a return visit to his MD office on  09/02/22.  PERTINENT HISTORY:  Prior laminectomy/medial facetectomy to L4-5, righ total ankle replacement, HTN.   PAIN:  Are you having pain? Yes: NPRS scale: 2/10 Pain location: Right LE Pain description: Numb Aggravating factors: As above. Relieving factors: Lying down.  PRECAUTIONS: Other: Lumbar laminectomy protocol.  WEIGHT BEARING RESTRICTIONS: Yes .  FALLS:  Has patient fallen in last 6 months? No  LIVING ENVIRONMENT: Lives in: House/apartment Has following equipment at home: None  PLOF: Independent  PATIENT GOALS: Get rid of right LE pain and symptoms.    OBJECTIVE:   PATIENT SURVEYS: FOTO 4.      SENSATION: C/o numbness into right LE to region of Tibialis Anterior.  POSTURE: rounded shoulders and forward head  PALPATION: Patient c/o generalized low back soreness.    LOWER EXTREMITY ROM:    In supine:  Bilateral hip flexion to 100 degrees.  LOWER EXTREMITY MMT:    Normal bilateral knee and ankle strength.  LUMBAR SPECIAL TESTS:  Equal leg lengths.  GAIT: Essentially WNL.  TRANFERS:  Sit to stand with use of armrest.  Sit to supine and supine to easily performed with ease.   ASSESSMENT:  CLINICAL IMPRESSION: The patient presents to [facility]PP[facility] s/p revision of bilateral L4-5 laminectomy performed on 08/16/22.  He c/o pain and numbness into his right LE to the region of his right tibialis anterior.  His symptoms increase with prolonged walking and transitioning from sit to stand.  He had no LE strength deficits.  He is very motivated to improve.   His FOTO limitation score is 35 (higher number mean greater function).  Patient will benefit from skilled physical therapy intervention to address pain and deficits.  OBJECTIVE IMPAIRMENTS: decreased activity tolerance and pain.   ACTIVITY LIMITATIONS: carrying, lifting, bending, and sit to stand transitions.  PARTICIPATION LIMITATIONS: cleaning, laundry, and yard work  PERSONAL FACTORS: Time since onset of injury/illness/exacerbation and 1 comorbidity: prior lumbar surgeries (per patient he has had three)  are also affecting patient's functional outcome.   REHAB POTENTIAL: Excellent  CLINICAL DECISION MAKING: Stable/uncomplicated  EVALUATION COMPLEXITY: Low   GOALS: LONG TERM GOALS: Target date: 10/18/22  Ind with an HEP. Goal status: INITIAL  2.  Walk a community distance with pain not > 3/10. Goal status: INITIAL  3.  Patient reports a 50% reduction in right LE symptoms. Goal status: INITIAL  PLAN:  PT FREQUENCY: 2x/week  PT DURATION: 6 weeks  PLANNED INTERVENTIONS: Therapeutic exercises, Therapeutic activity, Neuromuscular re-education, Patient/Family education, Self Care, Electrical stimulation, Cryotherapy, Moist heat, Ultrasound, and Manual therapy.  PLAN FOR NEXT SESSION: Progress per laminectomy protocol.  Begin with low-level core exercises.  Nustep.   Zellie Jenning, [place], PT 09/06/2022, 3:22 PM

## 2022-09-08 ENCOUNTER — Ambulatory Visit: Payer: Medicare PPO

## 2022-09-08 DIAGNOSIS — M79604 Pain in right leg: Secondary | ICD-10-CM

## 2022-09-08 DIAGNOSIS — M5459 Other low back pain: Secondary | ICD-10-CM | POA: Diagnosis not present

## 2022-09-08 NOTE — Therapy (Signed)
OUTPATIENT PHYSICAL THERAPY THORACOLUMBAR TREATMENT   Patient Name: Brett Jensen MRN: [id number] DOB:[birth year], 77 y.o., male Today's Date: 09/08/2022  END OF SESSION:  PT End of Session - 09/08/22 1114     Visit Number 2    Number of Visits 12    Date for PT Re-Evaluation 10/18/22    Authorization Type FOTO AT LEAST EVERY 5TH VISIT.  PROGRESS NOTE AT 10TH VISIT.  KX MODIFIER AFTER 15 VISITS.    PT Start Time 1115    PT Stop Time 1156    PT Time Calculation (min) 41 min    Activity Tolerance Patient tolerated treatment well    Behavior During Therapy WFL for tasks assessed/performed              Past Medical History:  Diagnosis Date   Arthritis    bilateral knees   Diabetes mellitus without complication ([place])    Erectile dysfunction    Hyperlipidemia    Hypertension    Restless leg syndrome    Shingles    Past Surgical History:  Procedure Laterality Date   JOINT REPLACEMENT Right    ankle   SPINE SURGERY     lower back   Patient Active Problem List   Diagnosis Date Noted   Lumbar radiculopathy 04/02/2020   Myalgia due to statin 01/03/2018   Arthritis    Hyperlipidemia associated with type 2 diabetes mellitus ([place])    Morbid obesity ([place]) 02/14/2013   Erectile dysfunction 02/14/2013   Hypertension associated with diabetes ([place]) 11/09/2010   DM (diabetes mellitus) ([place]) 11/09/2010     REFERRING PROVIDER: Garlan Fillers FNP.  REFERRING DIAG: Spinal stenosis of lumbar region without neurogenic claudication s/p revision of bilateral L4-5 laminectomy.    Rationale for Evaluation and Treatment: Rehabilitation  THERAPY DIAG:  Other low back pain  Pain in right leg  ONSET DATE: Ongoing.  Surgery 08/16/22.   SUBJECTIVE:                                                                                                                                                                                           SUBJECTIVE STATEMENT: Patient reports that his back  felt good with the heat on his back. He notes that his right lower extremity gives him the most problem as it feels weak and numb.   PERTINENT HISTORY:  Prior laminectomy/medial facetectomy to L4-5, righ total ankle replacement, HTN.   PAIN:  Are you having pain? Yes: NPRS scale: 3-4/10 Pain location: Right LE Pain description: Numb Aggravating factors: As above. Relieving factors: Lying down.  PRECAUTIONS: Other: Lumbar laminectomy protocol.  WEIGHT BEARING RESTRICTIONS:  Yes .  FALLS:  Has patient fallen in last 6 months? No  LIVING ENVIRONMENT: Lives in: House/apartment Has following equipment at home: None  PLOF: Independent  PATIENT GOALS: Get rid of right LE pain and symptoms.  OBJECTIVE:   PATIENT SURVEYS: FOTO 50.   SENSATION: C/o numbness into right LE to region of Tibialis Anterior.  POSTURE: rounded shoulders and forward head  PALPATION: Patient c/o generalized low back soreness.  LOWER EXTREMITY ROM:    In supine:  Bilateral hip flexion to 100 degrees.  LOWER EXTREMITY MMT:    Normal bilateral knee and ankle strength.  LUMBAR SPECIAL TESTS:  Equal leg lengths.  GAIT: Essentially WNL.  TRANFERS:  Sit to stand with use of armrest.  Sit to supine and supine to easily performed with ease.  TODAY'S TREATMENT:                                  2/8 EXERCISE LOG  Exercise Repetitions and Resistance Comments  Nustep  L3 x 11 minutes   Marching on foam  2 minutes    Rocker board  3 minutes    LAQ 5# x 2 minutes    Seated clams  Green t-band x 2 minutes    Seated hip ADD isometric  2 minutes w/ 5 second hold    Eccentric heel tap  6" step x 20 reps each    Chair taps  20 reps  To elevated mat table   Blank cell = exercise not performed today   ASSESSMENT:  CLINICAL IMPRESSION: Patient was introduced to multiple new interventions for improved lower extremity strength with minimal to moderate difficulty. He experienced the most difficulty with  eccentric heel taps with the right lower extremity. He required minimal cueing with this intervention to facilitate eccentric quadriceps engagement. He experienced no increase in pain or discomfort with any of today's interventions. He reported feeling good upon the conclusion of treatment. He continues to require skilled physical therapy to address his remaining impairments to return to his prior level of function.   OBJECTIVE IMPAIRMENTS: decreased activity tolerance and pain.   ACTIVITY LIMITATIONS: carrying, lifting, bending, and sit to stand transitions.  PARTICIPATION LIMITATIONS: cleaning, laundry, and yard work  PERSONAL FACTORS: Time since onset of injury/illness/exacerbation and 1 comorbidity: prior lumbar surgeries (per patient he has had three)  are also affecting patient's functional outcome.   REHAB POTENTIAL: Excellent  CLINICAL DECISION MAKING: Stable/uncomplicated  EVALUATION COMPLEXITY: Low   GOALS: LONG TERM GOALS: Target date: 10/18/22  Ind with an HEP. Goal status: INITIAL  2.  Walk a community distance with pain not > 3/10. Goal status: INITIAL  3.  Patient reports a 50% reduction in right LE symptoms. Goal status: INITIAL  PLAN:  PT FREQUENCY: 2x/week  PT DURATION: 6 weeks  PLANNED INTERVENTIONS: Therapeutic exercises, Therapeutic activity, Neuromuscular re-education, Patient/Family education, Self Care, Electrical stimulation, Cryotherapy, Moist heat, Ultrasound, and Manual therapy.  PLAN FOR NEXT SESSION: Progress per laminectomy protocol.  Begin with low-level core exercises.  Nustep.   Darlin Coco, PT 09/08/2022, 12:08 PM

## 2022-09-12 ENCOUNTER — Ambulatory Visit: Payer: Medicare PPO

## 2022-09-12 DIAGNOSIS — M79604 Pain in right leg: Secondary | ICD-10-CM

## 2022-09-12 DIAGNOSIS — M5459 Other low back pain: Secondary | ICD-10-CM | POA: Diagnosis not present

## 2022-09-12 NOTE — Therapy (Signed)
OUTPATIENT PHYSICAL THERAPY THORACOLUMBAR TREATMENT   Patient Name: Brett Jensen MRN: [id number] DOB:Dec 21, 1945, 77 y.o., male Today's Date: 09/12/2022  END OF SESSION:  PT End of Session - 09/12/22 1002     Visit Number 3    Number of Visits 12    Date for PT Re-Evaluation 10/18/22    Authorization Type FOTO AT LEAST EVERY 5TH VISIT.  PROGRESS NOTE AT 10TH VISIT.  KX MODIFIER AFTER 15 VISITS.    PT Start Time 1030    PT Stop Time 1115    PT Time Calculation (min) 45 min    Activity Tolerance Patient tolerated treatment well    Behavior During Therapy WFL for tasks assessed/performed              Past Medical History:  Diagnosis Date   Arthritis    bilateral knees   Diabetes mellitus without complication ([place])    Erectile dysfunction    Hyperlipidemia    Hypertension    Restless leg syndrome    Shingles    Past Surgical History:  Procedure Laterality Date   JOINT REPLACEMENT Right    ankle   SPINE SURGERY     lower back   Patient Active Problem List   Diagnosis Date Noted   Lumbar radiculopathy 04/02/2020   Myalgia due to statin 01/03/2018   Arthritis    Hyperlipidemia associated with type 2 diabetes mellitus ([place])    Morbid obesity ([place]) 02/14/2013   Erectile dysfunction 02/14/2013   Hypertension associated with diabetes ([place]) 11/09/2010   DM (diabetes mellitus) ([place]) 11/09/2010   REFERRING PROVIDER: Garlan Fillers FNP.  REFERRING DIAG: Spinal stenosis of lumbar region without neurogenic claudication s/p revision of bilateral L4-5 laminectomy.    Rationale for Evaluation and Treatment: Rehabilitation  THERAPY DIAG:  Other low back pain  Pain in right leg  ONSET DATE: Ongoing.  Surgery 08/16/22.  SUBJECTIVE:                                                                                                                                                                                           SUBJECTIVE STATEMENT: Patient reports that he feels better  as he is only having some numbness in his calf, but it is not down his whole leg.   PERTINENT HISTORY:  Prior laminectomy/medial facetectomy to L4-5, righ total ankle replacement, HTN.   PAIN:  Are you having pain? Yes: NPRS scale: 3-4/10 Pain location: Right LE Pain description: Numb Aggravating factors: As above. Relieving factors: Lying down.  PRECAUTIONS: Other: Lumbar laminectomy protocol.  WEIGHT BEARING RESTRICTIONS: Yes .  FALLS:  Has patient fallen in last  6 months? No  LIVING ENVIRONMENT: Lives in: House/apartment Has following equipment at home: None  PLOF: Independent  PATIENT GOALS: Get rid of right LE pain and symptoms.  OBJECTIVE:   PATIENT SURVEYS: FOTO 69.   SENSATION: C/o numbness into right LE to region of Tibialis Anterior.  POSTURE: rounded shoulders and forward head  PALPATION: Patient c/o generalized low back soreness.  LOWER EXTREMITY ROM:    In supine:  Bilateral hip flexion to 100 degrees.  LOWER EXTREMITY MMT:    Normal bilateral knee and ankle strength.  LUMBAR SPECIAL TESTS:  Equal leg lengths.  GAIT: Essentially WNL.  TRANFERS:  Sit to stand with use of armrest.  Sit to supine and supine to easily performed with ease.  TODAY'S TREATMENT:                                   2/12 EXERCISE LOG  Exercise Repetitions and Resistance Comments  Nustep L4 x 17 minutes   Rocker board  3 minutes   Standing gastroc stretch  3 x 30 seconds each Added to HEP   Cybex knee flexion  60# x 20 reps each  Unilateral LE   Soleus stretch 3 x 30 seconds   Tandem stance on firm surface  3 x 30 seconds    Blank cell = exercise not performed today                                    2/8 EXERCISE LOG  Exercise Repetitions and Resistance Comments  Nustep  L3 x 11 minutes   Marching on foam  2 minutes    Rocker board  3 minutes    LAQ 5# x 2 minutes    Seated clams  Green t-band x 2 minutes    Seated hip ADD isometric  2 minutes w/ 5 second  hold    Eccentric heel tap  6" step x 20 reps each    Chair taps  20 reps  To elevated mat table   Blank cell = exercise not performed today   ASSESSMENT:  CLINICAL IMPRESSION: Patient was progress with multiple new interventions for improved lower extremity strength. He required minimal cueing with resisted knee flexion for improved eccentric hamstring control to facilitate increased lower extremity demand. He experienced no increase in pain or discomfort with any of today's interventions. He reported feeling better upon the conclusion of treatment. He continues to require skilled physical therapy to address his remaining impairments to return to his prior level of function.   OBJECTIVE IMPAIRMENTS: decreased activity tolerance and pain.   ACTIVITY LIMITATIONS: carrying, lifting, bending, and sit to stand transitions.  PARTICIPATION LIMITATIONS: cleaning, laundry, and yard work  PERSONAL FACTORS: Time since onset of injury/illness/exacerbation and 1 comorbidity: prior lumbar surgeries (per patient he has had three)  are also affecting patient's functional outcome.   REHAB POTENTIAL: Excellent  CLINICAL DECISION MAKING: Stable/uncomplicated  EVALUATION COMPLEXITY: Low   GOALS: LONG TERM GOALS: Target date: 10/18/22  Ind with an HEP. Goal status: INITIAL  2.  Walk a community distance with pain not > 3/10. Goal status: INITIAL  3.  Patient reports a 50% reduction in right LE symptoms. Goal status: INITIAL  PLAN:  PT FREQUENCY: 2x/week  PT DURATION: 6 weeks  PLANNED INTERVENTIONS: Therapeutic exercises, Therapeutic activity, Neuromuscular re-education, Patient/Family education, Self Care,  Electrical stimulation, Cryotherapy, Moist heat, Ultrasound, and Manual therapy.  PLAN FOR NEXT SESSION: Progress per laminectomy protocol.  Begin with low-level core exercises.  Nustep.   Darlin Coco, PT 09/12/2022, 11:35 AM

## 2022-09-15 ENCOUNTER — Ambulatory Visit: Payer: Medicare PPO | Admitting: *Deleted

## 2022-09-15 ENCOUNTER — Encounter: Payer: Self-pay | Admitting: *Deleted

## 2022-09-15 DIAGNOSIS — M5459 Other low back pain: Secondary | ICD-10-CM | POA: Diagnosis not present

## 2022-09-15 DIAGNOSIS — M79604 Pain in right leg: Secondary | ICD-10-CM

## 2022-09-15 NOTE — Therapy (Signed)
OUTPATIENT PHYSICAL THERAPY THORACOLUMBAR TREATMENT   Patient Name: Brett Jensen MRN: [id number] DOB:28-Dec-1945, 77 y.o., male Today's Date: 09/15/2022  END OF SESSION:  PT End of Session - 09/15/22 0950     Visit Number 4    Number of Visits 12    Date for PT Re-Evaluation 10/18/22    Authorization Type FOTO AT LEAST EVERY 5TH VISIT.  PROGRESS NOTE AT 10TH VISIT.  KX MODIFIER AFTER 15 VISITS.    PT Start Time 0945    PT Stop Time 1032    PT Time Calculation (min) 47 min              Past Medical History:  Diagnosis Date   Arthritis    bilateral knees   Diabetes mellitus without complication ([facility])    Erectile dysfunction    Hyperlipidemia    Hypertension    Restless leg syndrome    Shingles    Past Surgical History:  Procedure Laterality Date   JOINT REPLACEMENT Right    ankle   SPINE SURGERY     lower back   Patient Active Problem List   Diagnosis Date Noted   Lumbar radiculopathy 04/02/2020   Myalgia due to statin 01/03/2018   Arthritis    Hyperlipidemia associated with type 2 diabetes mellitus ([place])    Morbid obesity ([place]) 02/14/2013   Erectile dysfunction 02/14/2013   Hypertension associated with diabetes ([place]) 11/09/2010   DM (diabetes mellitus) ([place]) 11/09/2010   REFERRING PROVIDER: Garlan Fillers FNP.  REFERRING DIAG: Spinal stenosis of lumbar region without neurogenic claudication s/p revision of bilateral L4-5 laminectomy.    Rationale for Evaluation and Treatment: Rehabilitation  THERAPY DIAG:  Pain in right leg  Other low back pain  ONSET DATE: Ongoing.  Surgery 08/16/22.  SUBJECTIVE:                                                                                                                                                                                           SUBJECTIVE STATEMENT: Patient reports that he feels better as he is only having 2/10 LBP today  PERTINENT HISTORY:  Prior laminectomy/medial facetectomy to L4-5, righ total  ankle replacement, HTN.   PAIN:  Are you having pain? Yes: NPRS scale: 2/10 Pain location: Right LE Pain description: Numb Aggravating factors: As above. Relieving factors: Lying down.  PRECAUTIONS: Other: Lumbar laminectomy protocol.  WEIGHT BEARING RESTRICTIONS: Yes .  FALLS:  Has patient fallen in last 6 months? No  LIVING ENVIRONMENT: Lives in: House/apartment Has following equipment at home: None  PLOF: Independent  PATIENT GOALS: Get rid of right LE pain and symptoms.  OBJECTIVE:   PATIENT SURVEYS: FOTO 36.   SENSATION: C/o numbness into right LE to region of Tibialis Anterior.  POSTURE: rounded shoulders and forward head  PALPATION: Patient c/o generalized low back soreness.  LOWER EXTREMITY ROM:    In supine:  Bilateral hip flexion to 100 degrees.  LOWER EXTREMITY MMT:    Normal bilateral knee and ankle strength.  LUMBAR SPECIAL TESTS:  Equal leg lengths.  GAIT: Essentially WNL.  TRANFERS:  Sit to stand with use of armrest.  Sit to supine and supine to easily performed with ease.  TODAY'S TREATMENT:                                   2/15 EXERCISE LOG  Exercise Repetitions and Resistance Comments  Nustep L4 x 17 minutes   Rocker board  4 minutes   Standing gastroc stretch   Added to HEP   Cybex knee EXT 40#s     3x8-10 Unilateral LE   Soleus stretch    Tandem stance on firm surface     Sit to stand  From Mat 3x10   cues for technique   Standing ball press X10 hold 10 secs core activation   XTS blue Row 2x10   XTS blue Lat pull down 2x10    Blank cell = exercise not performed today  Discussed and Practiced body mechanics for ADL's                                    2/8 EXERCISE LOG  Exercise Repetitions and Resistance Comments  Nustep  L3 x 11 minutes   Marching on foam  2 minutes    Rocker board  3 minutes    LAQ 5# x 2 minutes    Seated clams  Green t-band x 2 minutes    Seated hip ADD isometric  2 minutes w/ 5 second hold     Eccentric heel tap  6" step x 20 reps each    Chair taps  20 reps  To elevated mat table   Blank cell = exercise not performed today   ASSESSMENT:  CLINICAL IMPRESSION: Patient arrives today doing fairly well and was able to continue with core activation therex and balance act.'s. Body mechanics and movement patterns for ADL's were also practiced and discussed. Pt reports feeling good after session with no increased pain.    OBJECTIVE IMPAIRMENTS: decreased activity tolerance and pain.   ACTIVITY LIMITATIONS: carrying, lifting, bending, and sit to stand transitions.  PARTICIPATION LIMITATIONS: cleaning, laundry, and yard work  PERSONAL FACTORS: Time since onset of injury/illness/exacerbation and 1 comorbidity: prior lumbar surgeries (per patient he has had three)  are also affecting patient's functional outcome.   REHAB POTENTIAL: Excellent  CLINICAL DECISION MAKING: Stable/uncomplicated  EVALUATION COMPLEXITY: Low   GOALS: LONG TERM GOALS: Target date: 10/18/22  Ind with an HEP. Goal status: INITIAL  2.  Walk a community distance with pain not > 3/10. Goal status: INITIAL  3.  Patient reports a 50% reduction in right LE symptoms. Goal status: INITIAL  PLAN:  PT FREQUENCY: 2x/week  PT DURATION: 6 weeks  PLANNED INTERVENTIONS: Therapeutic exercises, Therapeutic activity, Neuromuscular re-education, Patient/Family education, Self Care, Electrical stimulation, Cryotherapy, Moist heat, Ultrasound, and Manual therapy.  PLAN FOR NEXT SESSION: Progress per laminectomy protocol.  Begin with low-level core exercises.  Nustep.  Olyvia Gopal,[name], PTA 09/15/2022, 10:35 AM

## 2022-09-19 ENCOUNTER — Ambulatory Visit: Payer: Medicare PPO | Admitting: Physical Therapy

## 2022-09-19 ENCOUNTER — Encounter: Payer: Self-pay | Admitting: Physical Therapy

## 2022-09-19 DIAGNOSIS — M79604 Pain in right leg: Secondary | ICD-10-CM

## 2022-09-19 DIAGNOSIS — M5459 Other low back pain: Secondary | ICD-10-CM

## 2022-09-19 NOTE — Therapy (Addendum)
OUTPATIENT PHYSICAL THERAPY THORACOLUMBAR TREATMENT   Patient Name: Brett Jensen MRN: [id number] DOB:1946-07-20, 77 y.o., male Today's Date: 09/19/2022  END OF SESSION:  PT End of Session - 09/19/22 0945     Visit Number 5    Number of Visits 12    Date for PT Re-Evaluation 10/18/22    Authorization Type FOTO AT LEAST EVERY 5TH VISIT.  PROGRESS NOTE AT 10TH VISIT.  KX MODIFIER AFTER 15 VISITS.    PT Start Time 0945    PT Stop Time 1026    PT Time Calculation (min) 41 min    Activity Tolerance Patient tolerated treatment well    Behavior During Therapy WFL for tasks assessed/performed            Past Medical History:  Diagnosis Date   Arthritis    bilateral knees   Diabetes mellitus without complication ([place])    Erectile dysfunction    Hyperlipidemia    Hypertension    Restless leg syndrome    Shingles    Past Surgical History:  Procedure Laterality Date   JOINT REPLACEMENT Right    ankle   SPINE SURGERY     lower back   Patient Active Problem List   Diagnosis Date Noted   Lumbar radiculopathy 04/02/2020   Myalgia due to statin 01/03/2018   Arthritis    Hyperlipidemia associated with type 2 diabetes mellitus ([place])    Morbid obesity ([place]) 02/14/2013   Erectile dysfunction 02/14/2013   Hypertension associated with diabetes ([place]) 11/09/2010   DM (diabetes mellitus) ([place]) 11/09/2010   REFERRING PROVIDER: Garlan Fillers FNP.  REFERRING DIAG: Spinal stenosis of lumbar region without neurogenic claudication s/p revision of bilateral L4-5 laminectomy.    Rationale for Evaluation and Treatment: Rehabilitation  THERAPY DIAG:  Pain in right leg  Other low back pain  ONSET DATE: Ongoing.  Surgery 08/16/22.  SUBJECTIVE:                                                                                                                                                                                           SUBJECTIVE STATEMENT: Reports mild pain in L low back from  incision area. No complaints of difficulty or pain with any ADLs or home activities.  PERTINENT HISTORY:  Prior laminectomy/medial facetectomy to L4-5, righ total ankle replacement, HTN.   PAIN:  Are you having pain? Yes: NPRS scale: 1/10 Pain location: Right LE Pain description: Numb Aggravating factors: As above. Relieving factors: Lying down.  PRECAUTIONS: Other: Lumbar laminectomy protocol.  PATIENT GOALS: Get rid of right LE pain and symptoms.  NEXT MD VISIT: 09/2022  OBJECTIVE:  PATIENT SURVEYS: FOTO 34.   SENSATION: C/o numbness into right LE to region of Tibialis Anterior.  PALPATION: Patient c/o generalized low back soreness.  LOWER EXTREMITY ROM:    In supine:  Bilateral hip flexion to 100 degrees.  TODAY'S TREATMENT:                                    EXERCISE LOG  Exercise Repetitions and Resistance Comments  Nustep L3 x17 min   Shoulder extension Blue XTS x20 reps VC for standing posture  Knee flexors 50# x20 reps   Knee extensors 40# x20 reps   Standing hip extension X15 reps each  VC for standing posture  Sit <> stands X10 reps; x5 reps within 11 sec   Airex heel taps 8" box Ue support x20 reps   D2 4# ball on airex X20 reps    4# ball reachouts on airex X10 reps   EC on airex X2 min and x1 min with head turns    Blank cell = exercise not performed today                                    2/15 EXERCISE LOG  Exercise Repetitions and Resistance Comments  Nustep L4 x 17 minutes   Rocker board  4 minutes   Standing gastroc stretch   Added to HEP   Cybex knee EXT 40#s     3x8-10 Unilateral LE   Soleus stretch    Tandem stance on firm surface     Sit to stand  From Mat 3x10   cues for technique   Standing ball press X10 hold 10 secs core activation   XTS blue Row 2x10   XTS blue Lat pull down 2x10    Blank cell = exercise not performed today .  Discussed and Practiced body mechanics for ADL's                                  2/8 EXERCISE  LOG  Exercise Repetitions and Resistance Comments  Nustep  L3 x 11 minutes   Marching on foam  2 minutes    Rocker board  3 minutes    LAQ 5# x 2 minutes    Seated clams  Green t-band x 2 minutes    Seated hip ADD isometric  2 minutes w/ 5 second hold    Eccentric heel tap  6" step x 20 reps each    Chair taps  20 reps  To elevated mat table   Blank cell = exercise not performed today   ASSESSMENT:  CLINICAL IMPRESSION: Patient presented in clinic with minimal LBP around incisional area. Patient able to tolerate all strengthening well with only VC for improving standing posture. Patient denies having an HEP from PT but is diligent with sit <> stands and walking at home. Patient was instructed post surgery to avoid sweeping or other household activities at this time. Patient able to complete all proprioceptive exercises well with minimal ankle sway observed.  OBJECTIVE IMPAIRMENTS: decreased activity tolerance and pain.   ACTIVITY LIMITATIONS: carrying, lifting, bending, and sit to stand transitions.  PARTICIPATION LIMITATIONS: cleaning, laundry, and yard work  PERSONAL FACTORS: Time since onset of injury/illness/exacerbation and 1 comorbidity: prior lumbar surgeries (per patient  he has had three)  are also affecting patient's functional outcome.   REHAB POTENTIAL: Excellent  CLINICAL DECISION MAKING: Stable/uncomplicated  EVALUATION COMPLEXITY: Low  GOALS: LONG TERM GOALS: Target date: 10/18/22  Ind with an HEP. Goal status: INITIAL  2.  Walk a community distance with pain not > 3/10. Goal status: MET  3.  Patient reports a 50% reduction in right LE symptoms. Goal status: MET  PLAN:  PT FREQUENCY: 2x/week  PT DURATION: 6 weeks  PLANNED INTERVENTIONS: Therapeutic exercises, Therapeutic activity, Neuromuscular re-education, Patient/Family education, Self Care, Electrical stimulation, Cryotherapy, Moist heat, Ultrasound, and Manual therapy.  PLAN FOR NEXT SESSION:  Progress per laminectomy protocol.  Begin with low-level core exercises.  Nustep.   Standley Brooking, PTA 09/19/2022, 10:43 AM

## 2022-09-22 ENCOUNTER — Encounter: Payer: Self-pay | Admitting: *Deleted

## 2022-09-22 ENCOUNTER — Ambulatory Visit: Payer: Medicare PPO | Admitting: *Deleted

## 2022-09-22 DIAGNOSIS — M5459 Other low back pain: Secondary | ICD-10-CM | POA: Diagnosis not present

## 2022-09-22 DIAGNOSIS — M79604 Pain in right leg: Secondary | ICD-10-CM

## 2022-09-22 NOTE — Therapy (Signed)
OUTPATIENT PHYSICAL THERAPY THORACOLUMBAR TREATMENT   Patient Name: Brett Jensen MRN: [id number] DOB:[birth year], 77 y.o., male Today's Date: 09/22/2022  END OF SESSION:  PT End of Session - 09/22/22 0950     Visit Number 6    Number of Visits 12    Date for PT Re-Evaluation 10/18/22    Authorization Type FOTO AT LEAST EVERY 5TH VISIT.  PROGRESS NOTE AT 10TH VISIT.  KX MODIFIER AFTER 15 VISITS.    PT Start Time 0945    PT Stop Time 1033    PT Time Calculation (min) 48 min            Past Medical History:  Diagnosis Date   Arthritis    bilateral knees   Diabetes mellitus without complication (HCC)    Erectile dysfunction    Hyperlipidemia    Hypertension    Restless leg syndrome    Shingles    Past Surgical History:  Procedure Laterality Date   JOINT REPLACEMENT Right    ankle   SPINE SURGERY     lower back   Patient Active Problem List   Diagnosis Date Noted   Lumbar radiculopathy 04/02/2020   Myalgia due to statin 01/03/2018   Arthritis    Hyperlipidemia associated with type 2 diabetes mellitus ([place])    Morbid obesity ([place]) 02/14/2013   Erectile dysfunction 02/14/2013   Hypertension associated with diabetes ([place]) 11/09/2010   DM (diabetes mellitus) ([place]) 11/09/2010   REFERRING PROVIDER: Garlan Fillers FNP.  REFERRING DIAG: Spinal stenosis of lumbar region without neurogenic claudication s/p revision of bilateral L4-5 laminectomy.    Rationale for Evaluation and Treatment: Rehabilitation  THERAPY DIAG:  Pain in right leg  ONSET DATE: Ongoing.  Surgery 08/16/22.  SUBJECTIVE:                                                                                                                                                                                           SUBJECTIVE STATEMENT: Doing much better.  No Pain      PERTINENT HISTORY:  Prior laminectomy/medial facetectomy to L4-5, righ total ankle replacement, HTN.   PAIN:  Are you having pain? Yes:  NPRS scale: 0/10 Pain location: Right LE Pain description: Numb Aggravating factors: As above. Relieving factors: Lying down.  PRECAUTIONS: Other: Lumbar laminectomy protocol.  PATIENT GOALS: Get rid of right LE pain and symptoms.  NEXT MD VISIT: 09/2022  OBJECTIVE:   PATIENT SURVEYS: FOTO 34.   SENSATION: C/o numbness into right LE to region of Tibialis Anterior.  PALPATION: Patient c/o generalized low back soreness.  LOWER EXTREMITY ROM:    In supine:  Bilateral hip flexion to 100 degrees.  TODAY'S TREATMENT:                                    EXERCISE LOG                  09-22-22    surgery    08-16-22 LB   Exercise Repetitions and Resistance Comments  Nustep L3 x15 min   Shoulder extension Blue XTS  2x10 reps hold 5 secs VC for standing posture  ROWS Blue   XTS 2x10 hold 5 secs   Knee flexors 40# 3x10 reps   Knee extensors 20# 3x10 reps   Standing hip extension   VC for standing posture  Sit <> stands X10 reps; with focus on hinge bending   Airex heel taps 8" box Ue support x20 reps   D2 4# ball on airex X20 reps    4# ball reachouts  2x10 reps   EC on airex     Blank cell = exercise not performed today                                    2/15 EXERCISE LOG  Exercise Repetitions and Resistance Comments  Nustep L4 x 17 minutes   Rocker board  4 minutes   Standing gastroc stretch   Added to HEP   Cybex knee EXT 40#s     3x8-10 Unilateral LE   Soleus stretch    Tandem stance on firm surface     Sit to stand  From Mat 3x10   cues for technique   Standing ball press X10 hold 10 secs core activation   XTS blue Row 2x10   XTS blue Lat pull down 2x10    Blank cell = exercise not performed today .  Discussed and Practiced body mechanics for ADL's                                  2/8 EXERCISE LOG  Exercise Repetitions and Resistance Comments  Nustep  L3 x 11 minutes   Marching on foam  2 minutes    Rocker board  3 minutes    LAQ 5# x 2 minutes    Seated clams   Green t-band x 2 minutes    Seated hip ADD isometric  2 minutes w/ 5 second hold    Eccentric heel tap  6" step x 20 reps each    Chair taps  20 reps  To elevated mat table   Blank cell = exercise not performed today   ASSESSMENT:  CLINICAL IMPRESSION: FOTO performed Pt arrived today doing fairly well with minimal LBP and was able to continue with balance act's as well as core activation exs.Pt felt good end of session   OBJECTIVE IMPAIRMENTS: decreased activity tolerance and pain.   ACTIVITY LIMITATIONS: carrying, lifting, bending, and sit to stand transitions.  PARTICIPATION LIMITATIONS: cleaning, laundry, and yard work  PERSONAL FACTORS: Time since onset of injury/illness/exacerbation and 1 comorbidity: prior lumbar surgeries (per patient he has had three)  are also affecting patient's functional outcome.   REHAB POTENTIAL: Excellent  CLINICAL DECISION MAKING: Stable/uncomplicated  EVALUATION COMPLEXITY: Low  GOALS: LONG TERM GOALS: Target date: 10/18/22  Ind with an HEP. Goal status: INITIAL  2.  Walk a community distance with pain not > 3/10. Goal status: MET  3.  Patient reports a 50% reduction in right LE symptoms. Goal status: MET  PLAN:  PT FREQUENCY: 2x/week  PT DURATION: 6 weeks  PLANNED INTERVENTIONS: Therapeutic exercises, Therapeutic activity, Neuromuscular re-education, Patient/Family education, Self Care, Electrical stimulation, Cryotherapy, Moist heat, Ultrasound, and Manual therapy.  PLAN FOR NEXT SESSION: Progress per laminectomy protocol.  Begin with low-level core exercises.  Nustep.   Brett Jensen,Brett Jensen, PTA 09/22/2022, 10:55 AM

## 2022-09-26 ENCOUNTER — Encounter: Payer: Self-pay | Admitting: Family Medicine

## 2022-09-26 ENCOUNTER — Ambulatory Visit: Payer: Medicare PPO

## 2022-09-26 ENCOUNTER — Ambulatory Visit: Payer: Medicare PPO | Admitting: Family Medicine

## 2022-09-26 ENCOUNTER — Telehealth: Payer: Self-pay | Admitting: Family Medicine

## 2022-09-26 VITALS — BP 151/66 | HR 63 | Ht 73.0 in | Wt 287.0 lb

## 2022-09-26 DIAGNOSIS — M79604 Pain in right leg: Secondary | ICD-10-CM

## 2022-09-26 DIAGNOSIS — E1169 Type 2 diabetes mellitus with other specified complication: Secondary | ICD-10-CM | POA: Diagnosis not present

## 2022-09-26 DIAGNOSIS — I152 Hypertension secondary to endocrine disorders: Secondary | ICD-10-CM

## 2022-09-26 DIAGNOSIS — E785 Hyperlipidemia, unspecified: Secondary | ICD-10-CM

## 2022-09-26 DIAGNOSIS — E1159 Type 2 diabetes mellitus with other circulatory complications: Secondary | ICD-10-CM | POA: Diagnosis not present

## 2022-09-26 DIAGNOSIS — M5459 Other low back pain: Secondary | ICD-10-CM | POA: Diagnosis not present

## 2022-09-26 LAB — CMP14+EGFR
ALT: 16 IU/L (ref 0–44)
AST: 19 IU/L (ref 0–40)
Albumin/Globulin Ratio: 1.5 (ref 1.2–2.2)
Albumin: 4.1 g/dL (ref 3.8–4.8)
Alkaline Phosphatase: 72 IU/L (ref 44–121)
BUN/Creatinine Ratio: 17 (ref 10–24)
BUN: 18 mg/dL (ref 8–27)
Bilirubin Total: 0.6 mg/dL (ref 0.0–1.2)
CO2: 23 mmol/L (ref 20–29)
Calcium: 9.4 mg/dL (ref 8.6–10.2)
Chloride: 101 mmol/L (ref 96–106)
Creatinine, Ser: 1.07 mg/dL (ref 0.76–1.27)
Globulin, Total: 2.8 g/dL (ref 1.5–4.5)
Glucose: 110 mg/dL — ABNORMAL HIGH (ref 70–99)
Potassium: 4.6 mmol/L (ref 3.5–5.2)
Sodium: 138 mmol/L (ref 134–144)
Total Protein: 6.9 g/dL (ref 6.0–8.5)
eGFR: 72 mL/min/{1.73_m2} (ref >59)

## 2022-09-26 LAB — CBC WITH DIFFERENTIAL/PLATELET
Basophils Absolute: 0.1 10*3/uL (ref 0.0–0.2)
Basos: 1 %
EOS (ABSOLUTE): 0.2 10*3/uL (ref 0.0–0.4)
Eos: 4 %
Hematocrit: 39.3 % (ref 37.5–51.0)
Hemoglobin: 12.8 g/dL — ABNORMAL LOW (ref 13.0–17.7)
Immature Grans (Abs): 0 10*3/uL (ref 0.0–0.1)
Immature Granulocytes: 0 %
Lymphocytes Absolute: 2.2 10*3/uL (ref 0.7–3.1)
Lymphs: 34 %
MCH: 30 pg (ref 26.6–33.0)
MCHC: 32.6 g/dL (ref 31.5–35.7)
MCV: 92 fL (ref 79–97)
Monocytes Absolute: 0.5 10*3/uL (ref 0.1–0.9)
Monocytes: 8 %
Neutrophils Absolute: 3.5 10*3/uL (ref 1.4–7.0)
Neutrophils: 53 %
Platelets: 305 10*3/uL (ref 150–450)
RBC: 4.26 x10E6/uL (ref 4.14–5.80)
RDW: 12.3 % (ref 11.6–15.4)
WBC: 6.5 10*3/uL (ref 3.4–10.8)

## 2022-09-26 LAB — LIPID PANEL
Chol/HDL Ratio: 4.2 ratio (ref 0.0–5.0)
Cholesterol, Total: 201 mg/dL — ABNORMAL HIGH (ref 100–199)
HDL: 48 mg/dL (ref >39)
LDL Chol Calc (NIH): 133 mg/dL — ABNORMAL HIGH (ref 0–99)
Triglycerides: 109 mg/dL (ref 0–149)
VLDL Cholesterol Cal: 20 mg/dL (ref 5–40)

## 2022-09-26 LAB — BAYER DCA HB A1C WAIVED: HB A1C (BAYER DCA - WAIVED): 6.8 % — ABNORMAL HIGH (ref 4.8–5.6)

## 2022-09-26 MED ORDER — DESONIDE 0.05 % EX CREA: TOPICAL_CREAM | Freq: Two times a day (BID) | CUTANEOUS | 1 refills | Status: DC

## 2022-09-26 NOTE — Telephone Encounter (Signed)
Future orders placed 

## 2022-09-26 NOTE — Progress Notes (Signed)
BP (!) 151/66   Pulse 63   Ht '6\' 1"'$  (1.854 m)   Wt 130.2 kg   SpO2 97%   BMI 37.87 kg/m    Subjective:   Patient ID: Brett Jensen, male    DOB: 11-Oct-1945, 77 y.o.   MRN: [id number]  HPI: Brett Jensen is a 77 y.o. male presenting on 09/26/2022 for Medical Management of Chronic Issues, Diabetes, Hyperlipidemia, and Hypertension Patient also has 2+ bilateral pitting edema in his lower legs, ankle, and feet. Patient does have compression stockings but has not been wearing them regularly.   Type 2 diabetes mellitus Patient presents for recheck of his diabetes. Patient currently takes Pioglitazone and is on an ACEI. HbA1c is 6.8 today. Patient's at-home blood glucose has been in the 90-110 range. Pertinent negatives for hypoglycemia include no dizziness or headaches. Associated symptoms include polyuria. Pertinent negatives for diabetes include no chest pain. He does not see a podiatrist. Eye exam is not current. Patient denies ulcerations and sensation loss in his feet.  Hyperlipidemia Patient presents for recheck of his hyperlipidemia. Patient is not currently on any medications. Past medical history of statin intolerance. Pertinent negatives include no chest pain, myalgias, and shortness of breath.  Hypertension Patient currently takes Lisinopril 5 mg. BP today is 156/68 and recheck was 151/66. Patient states he has been eating more pork and sausage in the past few weeks and his systolic numbers have been in the 150s as opposed to his normal 130s and 140s. Pertinent negatives include no chest pain, headaches, palpitations, dizziness, blurred vision, weakness, or shortness of breath.   Relevant past medical, surgical, family and social history reviewed and updated as indicated. Interim medical history since our last visit reviewed. Allergies and medications reviewed and updated.  Review of Systems  Constitutional:  Negative for chills and fever.  Eyes:  Negative for visual disturbance.   Respiratory:  Negative for chest tightness and shortness of breath.   Cardiovascular:  Positive for leg swelling. Negative for chest pain and palpitations.  Gastrointestinal:  Negative for abdominal pain, constipation, diarrhea, nausea and vomiting.  Endocrine: Positive for polyuria.  Musculoskeletal:  Negative for back pain, gait problem and myalgias.  Skin:  Negative for rash and wound.  Neurological:  Negative for dizziness, syncope, light-headedness, numbness and headaches.  Psychiatric/Behavioral:  Negative for confusion and decreased concentration.   All other systems reviewed and are negative.   Per HPI unless specifically indicated above   Allergies as of 09/26/2022   No Known Allergies      Medication List        Accurate as of September 26, 2022 10:51 AM. If you have any questions, ask your nurse or doctor.          Accu-Chek Aviva Plus test strip Generic drug: glucose blood 1 each by Other route 2 (two) times daily. CHECK BLOOD SUGAR ONCE A DAY OR AS DIRECTED Dx E11.9   Accu-Chek Aviva Plus w/Device Kit 1 Device by Does not apply route 2 (two) times daily at 10 AM and 5 PM.   aspirin EC 81 MG tablet Take 81 mg by mouth daily.   desonide 0.05 % cream Commonly known as: DesOwen Apply topically 2 (two) times daily. What changed: how much to take Changed by: Fransisca Kaufmann Aalia Greulich, MD   lisinopril 5 MG tablet Commonly known as: ZESTRIL Take 1 tablet (5 mg total) by mouth daily.   pioglitazone 30 MG tablet Commonly known as: Actos Take 1 tablet (  30 mg total) by mouth daily.   sildenafil 20 MG tablet Commonly known as: REVATIO Take 1-5 tablets (20-100 mg total) by mouth as needed.         Objective:   BP (!) 151/66   Pulse 63   Ht '6\' 1"'$  (1.854 m)   Wt 130.2 kg   SpO2 97%   BMI 37.87 kg/m   Wt Readings from Last 3 Encounters:  09/26/22 130.2 kg  05/26/22 127 kg  05/04/22 129.7 kg    Physical Exam Vitals reviewed.  Constitutional:       General: He is not in acute distress.    Appearance: Normal appearance. He is not diaphoretic.  Eyes:     General: No scleral icterus.    Conjunctiva/sclera: Conjunctivae normal.  Cardiovascular:     Rate and Rhythm: Normal rate and regular rhythm.     Pulses: Normal pulses.     Heart sounds: Normal heart sounds.  Pulmonary:     Effort: Pulmonary effort is normal.     Breath sounds: Normal breath sounds. No wheezing, rhonchi or rales.  Musculoskeletal:     Cervical back: Neck supple. No tenderness.     Right lower leg: Edema present.     Left lower leg: Edema present.     Comments: 2+ pitting edema bilaterally.  Skin:    General: Skin is warm and dry.  Neurological:     General: No focal deficit present.     Mental Status: He is alert.  Psychiatric:        Mood and Affect: Mood normal.        Behavior: Behavior normal.     Assessment & Plan:   Problem List Items Addressed This Visit       Cardiovascular and Mediastinum   Hypertension associated with diabetes ([place])   Relevant Orders   CBC with Differential/Platelet (Completed)   CMP14+EGFR (Completed)   Lipid panel (Completed)   Bayer DCA Hb A1c Waived (Completed)     Endocrine   DM (diabetes mellitus) ([place]) - Primary   Relevant Orders   CBC with Differential/Platelet (Completed)   CMP14+EGFR (Completed)   Lipid panel (Completed)   Bayer DCA Hb A1c Waived (Completed)   Hyperlipidemia associated with type 2 diabetes mellitus ([place])   Relevant Orders   CBC with Differential/Platelet (Completed)   CMP14+EGFR (Completed)   Lipid panel (Completed)   Bayer DCA Hb A1c Waived (Completed)  Patient was seen for hypertension, type II diabetes, and hyperlipidemia. HbA1c is 6.8. Patient was instructed to continue current medications and reduce intake of salty foods. Patient will take blood pressure and report to preceptor if systolic numbers remain in the 150s or if they return to his normal 130-140 range. Patient instructed to  start wearing his compression stockings again, elevate his legs when sitting, and increase his daily physical activity. Discussed option of fluid pill for pitting edema in lower extremities, but attempting to correct with conservative measures.  Follow up plan: Return in about 3 months (around 12/25/2022), or if symptoms worsen or fail to improve, for Diabetes hypertension and cholesterol recheck.  Counseling provided for all of the vaccine components Orders Placed This Encounter  Procedures   CBC with Differential/Platelet   CMP14+EGFR   Lipid panel   Bayer [facility] Hb A1c Waived   Summer Dunlap, PA-S2 09/26/2022, 11:04 AM   I was personally present for all components of the history, physical exam and/or medical decision making.  I agree with the documentation performed  by the PA student and agree with assessment and plan above.  PA student was [employer].  Caryl Pina, MD [facility] 09/26/2022, 10:51 AM

## 2022-09-26 NOTE — Therapy (Signed)
OUTPATIENT PHYSICAL THERAPY THORACOLUMBAR TREATMENT   Patient Name: Brett Jensen MRN: [id number] DOB:12-Jul-1946, 77 y.o., male Today's Date: 09/26/2022  END OF SESSION:  PT End of Session - 09/26/22 1303     Visit Number 7    Number of Visits 12    Date for PT Re-Evaluation 10/18/22    Authorization Type FOTO AT LEAST EVERY 5TH VISIT.  PROGRESS NOTE AT 10TH VISIT.  KX MODIFIER AFTER 15 VISITS.    PT Start Time 1300    PT Stop Time 1345    PT Time Calculation (min) 45 min            Past Medical History:  Diagnosis Date   Arthritis    bilateral knees   Diabetes mellitus without complication (HCC)    Erectile dysfunction    Hyperlipidemia    Hypertension    Restless leg syndrome    Shingles    Past Surgical History:  Procedure Laterality Date   JOINT REPLACEMENT Right    ankle   SPINE SURGERY     lower back   Patient Active Problem List   Diagnosis Date Noted   Lumbar radiculopathy 04/02/2020   Myalgia due to statin 01/03/2018   Arthritis    Hyperlipidemia associated with type 2 diabetes mellitus ([place])    Morbid obesity ([place]) 02/14/2013   Erectile dysfunction 02/14/2013   Hypertension associated with diabetes ([place]) 11/09/2010   DM (diabetes mellitus) ([place]) 11/09/2010   REFERRING PROVIDER: Garlan Fillers FNP.  REFERRING DIAG: Spinal stenosis of lumbar region without neurogenic claudication s/p revision of bilateral L4-5 laminectomy.    Rationale for Evaluation and Treatment: Rehabilitation  THERAPY DIAG:  Pain in right leg  Other low back pain  ONSET DATE: Ongoing.  Surgery 08/16/22.  SUBJECTIVE:                                                                                                                                                                                           SUBJECTIVE STATEMENT:  Pt denies any pain today.  PERTINENT HISTORY:  Prior laminectomy/medial facetectomy to L4-5, righ total ankle replacement, HTN.   PAIN:  Are you  having pain? No  PRECAUTIONS: Other: Lumbar laminectomy protocol.  PATIENT GOALS: Get rid of right LE pain and symptoms.  NEXT MD VISIT: 09/2022  OBJECTIVE:   PATIENT SURVEYS: FOTO 34.   SENSATION: C/o numbness into right LE to region of Tibialis Anterior.  PALPATION: Patient c/o generalized low back soreness.  LOWER EXTREMITY ROM:    In supine:  Bilateral hip flexion to 100 degrees.  TODAY'S TREATMENT:  EXERCISE LOG      09-26-22    surgery    08-16-22 LB   Exercise Repetitions and Resistance Comments  Nustep L5 x15 min   Shoulder extension Blue XTS  2x15 reps hold 5 secs   ROWS Blue XTS 2x15 hold 5 secs   Knee flexors 50# x50 reps   Knee extensors 30# x30 reps   Leg Press 2 pl x 30 reps   Standing hip extension 3# x 25 reps bil    Standing hip extension 3# x 25 reps bil   Sit <> stands    Side-stepping Airex beam x 3 mins   Tandem Gait Airex beam x 3 mins            Blank cell = exercise not performed today                         ASSESSMENT:  CLINICAL IMPRESSION:  Pt arrives for today's treatment session denying any pain. Pt able to tolerate increased reps with standing XTS as well as increased resistance with cybex exercises.  Pt introduced to leg press with min cues for eccentric control.  Pt also introduced to Airex balance beam for side-stepping and tandem gait today.  Pt requiring BUE support with all balance exercises.  Pt denied any pain at completion of today's treatment session.   OBJECTIVE IMPAIRMENTS: decreased activity tolerance and pain.   ACTIVITY LIMITATIONS: carrying, lifting, bending, and sit to stand transitions.  PARTICIPATION LIMITATIONS: cleaning, laundry, and yard work  PERSONAL FACTORS: Time since onset of injury/illness/exacerbation and 1 comorbidity: prior lumbar surgeries (per patient he has had three)  are also affecting patient's functional outcome.   REHAB POTENTIAL: Excellent  CLINICAL DECISION MAKING:  Stable/uncomplicated  EVALUATION COMPLEXITY: Low  GOALS: LONG TERM GOALS: Target date: 10/18/22  Ind with an HEP. Goal status: IN PROGRESS  2.  Walk a community distance with pain not > 3/10. Goal status: MET  3.  Patient reports a 50% reduction in right LE symptoms. Goal status: MET  PLAN:  PT FREQUENCY: 2x/week  PT DURATION: 6 weeks  PLANNED INTERVENTIONS: Therapeutic exercises, Therapeutic activity, Neuromuscular re-education, Patient/Family education, Self Care, Electrical stimulation, Cryotherapy, Moist heat, Ultrasound, and Manual therapy.  PLAN FOR NEXT SESSION: Progress per laminectomy protocol.  Begin with low-level core exercises.  Nustep.   Kathrynn Ducking, PTA 09/26/2022, 1:50 PM

## 2022-09-29 ENCOUNTER — Encounter: Payer: Self-pay | Admitting: *Deleted

## 2022-09-29 ENCOUNTER — Ambulatory Visit: Payer: Medicare PPO | Admitting: *Deleted

## 2022-09-29 DIAGNOSIS — M79604 Pain in right leg: Secondary | ICD-10-CM | POA: Diagnosis not present

## 2022-09-29 DIAGNOSIS — M5459 Other low back pain: Secondary | ICD-10-CM

## 2022-09-29 NOTE — Therapy (Signed)
OUTPATIENT PHYSICAL THERAPY THORACOLUMBAR TREATMENT   Patient Name: Brett Jensen MRN: [id number] DOB:February 13, 1946, 77 y.o., male Today's Date: 09/29/2022  END OF SESSION:  PT End of Session - 09/29/22 0954     Visit Number 8    Number of Visits 12    Date for PT Re-Evaluation 10/18/22    Authorization Type FOTO AT LEAST EVERY 5TH VISIT.  PROGRESS NOTE AT 10TH VISIT.  KX MODIFIER AFTER 15 VISITS.    PT Start Time 0945    PT Stop Time 1035    PT Time Calculation (min) 50 min            Past Medical History:  Diagnosis Date   Arthritis    bilateral knees   Diabetes mellitus without complication (HCC)    Erectile dysfunction    Hyperlipidemia    Hypertension    Restless leg syndrome    Shingles    Past Surgical History:  Procedure Laterality Date   JOINT REPLACEMENT Right    ankle   SPINE SURGERY     lower back   Patient Active Problem List   Diagnosis Date Noted   Lumbar radiculopathy 04/02/2020   Myalgia due to statin 01/03/2018   Arthritis    Hyperlipidemia associated with type 2 diabetes mellitus ([place])    Morbid obesity ([place]) 02/14/2013   Erectile dysfunction 02/14/2013   Hypertension associated with diabetes ([place]) 11/09/2010   DM (diabetes mellitus) ([place]) 11/09/2010   REFERRING PROVIDER: Garlan Fillers FNP.  REFERRING DIAG: Spinal stenosis of lumbar region without neurogenic claudication s/p revision of bilateral L4-5 laminectomy.    Rationale for Evaluation and Treatment: Rehabilitation  THERAPY DIAG:  Pain in right leg  Other low back pain  ONSET DATE: Ongoing.  Surgery 08/16/22.  SUBJECTIVE:                                                                                                                                                                                           SUBJECTIVE STATEMENT:  Pt denies any pain today.  PERTINENT HISTORY:  Prior laminectomy/medial facetectomy to L4-5, righ total ankle replacement, HTN.   PAIN:  Are you  having pain? No  PRECAUTIONS: Other: Lumbar laminectomy protocol.  PATIENT GOALS: Get rid of right LE pain and symptoms.  NEXT MD VISIT: 09/2022  OBJECTIVE:   PATIENT SURVEYS: FOTO 34.      FOTO performed on 6th   SENSATION: C/o numbness into right LE to region of Tibialis Anterior.  PALPATION: Patient c/o generalized low back soreness.  LOWER EXTREMITY ROM:    In supine:  Bilateral hip flexion to 100 degrees.  TODAY'S  TREATMENT:                  EXERCISE LOG      09-29-22    surgery    08-16-22 LB   Exercise Repetitions and Resistance Comments  Nustep L5 x15 min   Shoulder extension Blue XTS  2x15 reps hold 5 secs   ROWS Blue XTS 2x15 hold 5 secs   Knee flexors 40#  2x15reps   Knee extensors 30# 2x15 reps   Standing modified bird-dog   Leg raise x 6 hold 5 secs Bil.        Standing hip extension    Sit <> stands X 10 with focus on hinge bending   Side-stepping Airex beam x 3 mins   Tandem Gait Airex beam x 3 mins            Blank cell = exercise not performed today                         ASSESSMENT:  CLINICAL IMPRESSION:  Pt arrives for today's treatment session denying any pain and has a f/u with MD tomorrow. Pt continues to do well without complaints of pain. Rx focused on LE and core strengthening Exs. Pt reports to LE pain now and minimal LB soreness. Standing modified bird-dog leg raise added for core activation and tolerated well. Pt still with 4 visits left and wishes to complete them before DC to HEP.      OBJECTIVE IMPAIRMENTS: decreased activity tolerance and pain.   ACTIVITY LIMITATIONS: carrying, lifting, bending, and sit to stand transitions.  PARTICIPATION LIMITATIONS: cleaning, laundry, and yard work  PERSONAL FACTORS: Time since onset of injury/illness/exacerbation and 1 comorbidity: prior lumbar surgeries (per patient he has had three)  are also affecting patient's functional outcome.   REHAB POTENTIAL: Excellent  CLINICAL DECISION MAKING:  Stable/uncomplicated  EVALUATION COMPLEXITY: Low  GOALS: LONG TERM GOALS: Target date: 10/18/22  Ind with an HEP. Goal status: IN PROGRESS  2.  Walk a community distance with pain not > 3/10. Goal status: MET  3.  Patient reports a 50% reduction in right LE symptoms. Goal status: MET  PLAN:  PT FREQUENCY: 2x/week  PT DURATION: 6 weeks  PLANNED INTERVENTIONS: Therapeutic exercises, Therapeutic activity, Neuromuscular re-education, Patient/Family education, Self Care, Electrical stimulation, Cryotherapy, Moist heat, Ultrasound, and Manual therapy.  PLAN FOR NEXT SESSION: Progress per laminectomy protocol.  Begin with low-level core exercises.  Nustep.   Luceil Herrin,[name], PTA 09/29/2022, 5:45 PM

## 2022-09-30 ENCOUNTER — Other Ambulatory Visit: Payer: Self-pay

## 2022-09-30 MED ORDER — ROSUVASTATIN CALCIUM 5 MG PO TABS: 5.0000 mg | ORAL_TABLET | Freq: Every evening | ORAL | 1 refills | Status: DC

## 2022-10-03 ENCOUNTER — Ambulatory Visit: Payer: Medicare PPO | Attending: Nurse Practitioner | Admitting: *Deleted

## 2022-10-03 ENCOUNTER — Encounter: Payer: Self-pay | Admitting: *Deleted

## 2022-10-03 DIAGNOSIS — M5459 Other low back pain: Secondary | ICD-10-CM | POA: Diagnosis not present

## 2022-10-03 DIAGNOSIS — M79604 Pain in right leg: Secondary | ICD-10-CM

## 2022-10-03 NOTE — Therapy (Signed)
OUTPATIENT PHYSICAL THERAPY THORACOLUMBAR TREATMENT   Patient Name: Brett Jensen MRN: [id number] DOB:April 04, 1946, 77 y.o., male Today's Date: 10/03/2022  END OF SESSION:  PT End of Session - 10/03/22 0951     Visit Number 9    Number of Visits 12    Date for PT Re-Evaluation 10/18/22    Authorization Type FOTO AT LEAST EVERY 5TH VISIT.  PROGRESS NOTE AT 10TH VISIT.  KX MODIFIER AFTER 15 VISITS.    PT Start Time 0945    PT Stop Time 1035    PT Time Calculation (min) 50 min            Past Medical History:  Diagnosis Date   Arthritis    bilateral knees   Diabetes mellitus without complication (HCC)    Erectile dysfunction    Hyperlipidemia    Hypertension    Restless leg syndrome    Shingles    Past Surgical History:  Procedure Laterality Date   JOINT REPLACEMENT Right    ankle   SPINE SURGERY     lower back   Patient Active Problem List   Diagnosis Date Noted   Lumbar radiculopathy 04/02/2020   Myalgia due to statin 01/03/2018   Arthritis    Hyperlipidemia associated with type 2 diabetes mellitus ([place])    Morbid obesity ([place]) 02/14/2013   Erectile dysfunction 02/14/2013   Hypertension associated with diabetes ([place]) 11/09/2010   DM (diabetes mellitus) ([place]) 11/09/2010   REFERRING PROVIDER: Garlan Fillers FNP.  REFERRING DIAG: Spinal stenosis of lumbar region without neurogenic claudication s/p revision of bilateral L4-5 laminectomy.    Rationale for Evaluation and Treatment: Rehabilitation  THERAPY DIAG:  Pain in right leg  Other low back pain  ONSET DATE: Ongoing.  Surgery 08/16/22.  SUBJECTIVE:                                                                                                                                                                                           SUBJECTIVE STATEMENT:  Pt denies any pain today. Reports MD was pleased and wants him to finish out PT visits  PERTINENT HISTORY:  Prior laminectomy/medial facetectomy to L4-5,  righ total ankle replacement, HTN.   PAIN:  Are you having pain? No  PRECAUTIONS: Other: Lumbar laminectomy protocol.  PATIENT GOALS: Get rid of right LE pain and symptoms.  NEXT MD VISIT: 09/2022  OBJECTIVE:   PATIENT SURVEYS: FOTO 34.      FOTO performed on 6th   SENSATION: C/o numbness into right LE to region of Tibialis Anterior.  PALPATION: Patient c/o generalized low back soreness.  LOWER EXTREMITY ROM:  In supine:  Bilateral hip flexion to 100 degrees.  TODAY'S TREATMENT:                  EXERCISE LOG      10-03-22    surgery    08-16-22 LB   Exercise Repetitions and Resistance Comments  Nustep L5 x15 min   Shoulder extension Blue XTS  2x10 reps hold 5 secs   ROWS Blue XTS 2x10 hold 5 secs   Knee flexors 40#  2x15reps   Knee extensors 30# 2x15 reps   Standing modified bird-dog   Leg raise x 6 hold 5 secs Bil.        Standing hip extension    Sit <> stands X 15  with focus on hinge bending   Side-stepping Airex beam x 3 mins   Tandem Gait Airex beam x 3 mins            Blank cell = exercise not performed today                         ASSESSMENT:  CLINICAL IMPRESSION: FOTO next vist Pt arrives for today's treatment session reporting MD was pleased and wants him to finish his 4 visits and then DC to HEP. Pt did well with Rx today without complaints. Rx focused on core and LE strengthening with good movement patterns     OBJECTIVE IMPAIRMENTS: decreased activity tolerance and pain.   ACTIVITY LIMITATIONS: carrying, lifting, bending, and sit to stand transitions.  PARTICIPATION LIMITATIONS: cleaning, laundry, and yard work  PERSONAL FACTORS: Time since onset of injury/illness/exacerbation and 1 comorbidity: prior lumbar surgeries (per patient he has had three)  are also affecting patient's functional outcome.   REHAB POTENTIAL: Excellent  CLINICAL DECISION MAKING: Stable/uncomplicated  EVALUATION COMPLEXITY: Low  GOALS: LONG TERM GOALS: Target date:  10/18/22  Ind with an HEP. Goal status: IN PROGRESS  2.  Walk a community distance with pain not > 3/10. Goal status: MET  3.  Patient reports a 50% reduction in right LE symptoms. Goal status: MET  PLAN:  PT FREQUENCY: 2x/week  PT DURATION: 6 weeks  PLANNED INTERVENTIONS: Therapeutic exercises, Therapeutic activity, Neuromuscular re-education, Patient/Family education, Self Care, Electrical stimulation, Cryotherapy, Moist heat, Ultrasound, and Manual therapy.  PLAN FOR NEXT SESSION: Progress per laminectomy protocol.  Begin with low-level core exercises.  Nustep.   Tomoko Sandra,[name], PTA 10/03/2022, 3:04 PM

## 2022-10-06 ENCOUNTER — Ambulatory Visit: Payer: Medicare PPO

## 2022-10-06 DIAGNOSIS — M79604 Pain in right leg: Secondary | ICD-10-CM | POA: Diagnosis not present

## 2022-10-06 DIAGNOSIS — M5459 Other low back pain: Secondary | ICD-10-CM

## 2022-10-06 NOTE — Therapy (Signed)
OUTPATIENT PHYSICAL THERAPY THORACOLUMBAR TREATMENT   Patient Name: Brett Jensen MRN: [id number] DOB:09/13/1945, 77 y.o., male Today's Date: 10/06/2022  END OF SESSION:  PT End of Session - 10/06/22 1058     Visit Number 10    Number of Visits 12    Date for PT Re-Evaluation 10/18/22    Authorization Type FOTO AT LEAST EVERY 5TH VISIT.  PROGRESS NOTE AT 10TH VISIT.  KX MODIFIER AFTER 15 VISITS.    PT Start Time 1100    PT Stop Time 1141    PT Time Calculation (min) 41 min    Activity Tolerance Patient tolerated treatment well    Behavior During Therapy WFL for tasks assessed/performed            Past Medical History:  Diagnosis Date   Arthritis    bilateral knees   Diabetes mellitus without complication ([place])    Erectile dysfunction    Hyperlipidemia    Hypertension    Restless leg syndrome    Shingles    Past Surgical History:  Procedure Laterality Date   JOINT REPLACEMENT Right    ankle   SPINE SURGERY     lower back   Patient Active Problem List   Diagnosis Date Noted   Lumbar radiculopathy 04/02/2020   Myalgia due to statin 01/03/2018   Arthritis    Hyperlipidemia associated with type 2 diabetes mellitus ([place])    Morbid obesity ([place]) 02/14/2013   Erectile dysfunction 02/14/2013   Hypertension associated with diabetes ([place]) 11/09/2010   DM (diabetes mellitus) ([place]) 11/09/2010   REFERRING PROVIDER: Garlan Fillers FNP.  REFERRING DIAG: Spinal stenosis of lumbar region without neurogenic claudication s/p revision of bilateral L4-5 laminectomy.    Rationale for Evaluation and Treatment: Rehabilitation  THERAPY DIAG:  Pain in right leg  Other low back pain  ONSET DATE: Ongoing.  Surgery 08/16/22.  SUBJECTIVE:                                                                                                                                                                                           SUBJECTIVE STATEMENT:  Patient reports that he is able to do  everything he wants to do and has met all of his goals. "This is the best I have felt in a while."   PERTINENT HISTORY:  Prior laminectomy/medial facetectomy to L4-5, righ total ankle replacement, HTN.   PAIN:  Are you having pain? No  PRECAUTIONS: Other: Lumbar laminectomy protocol.  PATIENT GOALS: Get rid of right LE pain and symptoms.  NEXT MD VISIT: 09/2022  OBJECTIVE:   PATIENT SURVEYS: FOTO 94.05 on 10/06/22  SENSATION: C/o numbness into right LE to region of Tibialis Anterior.  PALPATION: Patient c/o generalized low back soreness.  LOWER EXTREMITY ROM:    In supine:  Bilateral hip flexion to 100 degrees.  TODAY'S TREATMENT:                                   3/7 EXERCISE LOG  Exercise Repetitions and Resistance Comments  Nustep  L6 x 16 minutes   Standing donkey kicks  5# x 20 reps each    SLR 5# x 15 reps each   Bridge with march 5# x 15 reps each    SL hip ABD  5# x 20 reps each    Cybex knee flexion  60# x 40 reps    Cybex knee extension  40# x 25 reps    Tandem balance on floor  4 x 30 seconds each     Blank cell = exercise not performed today                   EXERCISE LOG      10-03-22    surgery    08-16-22 LB   Exercise Repetitions and Resistance Comments  Nustep L5 x15 min   Shoulder extension Blue XTS  2x10 reps hold 5 secs   ROWS Blue XTS 2x10 hold 5 secs   Knee flexors 40#  2x15reps   Knee extensors 30# 2x15 reps   Standing modified bird-dog   Leg raise x 6 hold 5 secs Bil.        Standing hip extension    Sit <> stands X 15  with focus on hinge bending   Side-stepping Airex beam x 3 mins   Tandem Gait Airex beam x 3 mins            Blank cell = exercise not performed today                         ASSESSMENT:  CLINICAL IMPRESSION:  Patient has made excellent progress with skilled physical therapy as evidenced by his subjective reports, objective measures, functional mobility, and progress toward his goals. He was able to meet all of his  goals for therapy and felt comfortable being discharged at this time. His HEP was reviewed and he felt comfortable with these interventions. He reported feeling good and ready to be discharged at the conclusion of treatment.   PHYSICAL THERAPY DISCHARGE SUMMARY  Visits from Start of Care: 10  Current functional level related to goals / functional outcomes: Patient was able to meet all of his goals for therapy.    Remaining deficits: None    Education / Equipment: HEP   Patient agrees to discharge. Patient goals were met. Patient is being discharged due to meeting the stated rehab goals.   OBJECTIVE IMPAIRMENTS: decreased activity tolerance and pain.   ACTIVITY LIMITATIONS: carrying, lifting, bending, and sit to stand transitions.  PARTICIPATION LIMITATIONS: cleaning, laundry, and yard work  PERSONAL FACTORS: Time since onset of injury/illness/exacerbation and 1 comorbidity: prior lumbar surgeries (per patient he has had three)  are also affecting patient's functional outcome.   REHAB POTENTIAL: Excellent  CLINICAL DECISION MAKING: Stable/uncomplicated  EVALUATION COMPLEXITY: Low  GOALS: LONG TERM GOALS: Target date: 10/18/22  Ind with an HEP. Goal status: MET  2.  Walk a community distance with pain not > 3/10. Goal status: MET  3.  Patient reports a 50% reduction in right LE symptoms. Goal status: MET  PLAN:  PT FREQUENCY: 2x/week  PT DURATION: 6 weeks  PLANNED INTERVENTIONS: Therapeutic exercises, Therapeutic activity, Neuromuscular re-education, Patient/Family education, Self Care, Electrical stimulation, Cryotherapy, Moist heat, Ultrasound, and Manual therapy.  PLAN FOR NEXT SESSION: Progress per laminectomy protocol.  Begin with low-level core exercises.  Nustep.   Darlin Coco, PT 10/06/2022, 12:03 PM

## 2022-10-11 ENCOUNTER — Encounter: Payer: Medicare PPO | Admitting: *Deleted

## 2022-10-13 ENCOUNTER — Encounter: Payer: Medicare PPO | Admitting: *Deleted

## 2022-12-12 ENCOUNTER — Other Ambulatory Visit: Payer: Self-pay | Admitting: Family Medicine

## 2022-12-12 DIAGNOSIS — I152 Hypertension secondary to endocrine disorders: Secondary | ICD-10-CM

## 2022-12-20 DIAGNOSIS — Z961 Presence of intraocular lens: Secondary | ICD-10-CM | POA: Diagnosis not present

## 2022-12-20 DIAGNOSIS — H524 Presbyopia: Secondary | ICD-10-CM | POA: Diagnosis not present

## 2022-12-20 DIAGNOSIS — H2512 Age-related nuclear cataract, left eye: Secondary | ICD-10-CM | POA: Diagnosis not present

## 2022-12-20 DIAGNOSIS — E119 Type 2 diabetes mellitus without complications: Secondary | ICD-10-CM | POA: Diagnosis not present

## 2022-12-20 DIAGNOSIS — H43813 Vitreous degeneration, bilateral: Secondary | ICD-10-CM | POA: Diagnosis not present

## 2022-12-20 DIAGNOSIS — H52223 Regular astigmatism, bilateral: Secondary | ICD-10-CM | POA: Diagnosis not present

## 2022-12-23 ENCOUNTER — Other Ambulatory Visit: Payer: Self-pay | Admitting: Family Medicine

## 2022-12-23 ENCOUNTER — Encounter: Payer: Self-pay | Admitting: Family Medicine

## 2022-12-23 ENCOUNTER — Ambulatory Visit: Payer: Medicare PPO | Admitting: Family Medicine

## 2022-12-23 DIAGNOSIS — J029 Acute pharyngitis, unspecified: Secondary | ICD-10-CM | POA: Diagnosis not present

## 2022-12-23 LAB — RAPID STREP SCREEN (MED CTR MEBANE ONLY): Strep Gp A Ag, IA W/Reflex: NEGATIVE

## 2022-12-23 LAB — CULTURE, GROUP A STREP

## 2022-12-23 NOTE — Patient Instructions (Signed)
Flonase and Xyzal or Zyrtec or Allegra

## 2022-12-23 NOTE — Progress Notes (Signed)
Acute Office Visit  Subjective:  Patient ID: Brett Jensen, male    DOB: 1945-09-01, 77 y.o.   MRN: [id number]  Chief Complaint  Patient presents with   Acute Visit    Sore throat X4 days Concerned because strep has been going around   HPI Patient is in today for sore throat that started 4 days ago. Reports that it is better than it was yesterday. Denies other symptoms of URI such as cough, congestion, runny nose, fever, and ear pain. States that he is taking a lozenge. States that his niece had strep throat last week, but he was not in contact with her.   ROS As per HPI Objective:  BP 133/71   Pulse 74   Temp 98.5 F (36.9 C)   Ht 6\' 1"  (1.854 m)   Wt 293 lb (132.9 kg)   SpO2 96%   BMI 38.66 kg/m   Physical Exam Constitutional:      General: He is awake. He is not in acute distress.    Appearance: Normal appearance. He is well-developed and well-groomed. He is not ill-appearing, toxic-appearing or diaphoretic.  HENT:     Right Ear: No decreased hearing noted. No laceration, drainage, swelling or tenderness. No middle ear effusion. There is no impacted cerumen. No foreign body. No mastoid tenderness. No PE tube. No hemotympanum. Tympanic membrane is injected. Tympanic membrane is not scarred, perforated, erythematous, retracted or bulging.     Left Ear: No decreased hearing noted. No laceration, drainage, swelling or tenderness.  No middle ear effusion. There is no impacted cerumen. No foreign body. No mastoid tenderness. No PE tube. No hemotympanum. Tympanic membrane is injected. Tympanic membrane is not scarred, perforated, erythematous, retracted or bulging.     Nose:     Right Sinus: No maxillary sinus tenderness or frontal sinus tenderness.     Left Sinus: No maxillary sinus tenderness or frontal sinus tenderness.     Mouth/Throat:     Lips: Pink. No lesions.     Mouth: Mucous membranes are moist. No injury, lacerations, oral lesions or angioedema.     Tongue: No  lesions. Tongue does not deviate from midline.     Palate: No mass and lesions.     Pharynx: Posterior oropharyngeal erythema present. No pharyngeal swelling, oropharyngeal exudate or uvula swelling.     Tonsils: No tonsillar exudate or tonsillar abscesses. 1+ on the right. 1+ on the left.  Neck:     Thyroid: No thyroid mass, thyromegaly or thyroid tenderness.     Trachea: Trachea and phonation normal.  Cardiovascular:     Rate and Rhythm: Normal rate.     Pulses: Normal pulses.          Radial pulses are 2+ on the right side and 2+ on the left side.       Posterior tibial pulses are 2+ on the right side and 2+ on the left side.     Heart sounds: Normal heart sounds. No murmur heard.    No gallop.  Pulmonary:     Effort: Pulmonary effort is normal. No respiratory distress.     Breath sounds: Normal breath sounds. No stridor. No wheezing, rhonchi or rales.  Musculoskeletal:     Cervical back: Full passive range of motion without pain and neck supple.     Right lower leg: No edema.     Left lower leg: No edema.  Lymphadenopathy:     Cervical: No cervical adenopathy.  Right cervical: No superficial, deep or posterior cervical adenopathy.    Left cervical: No superficial, deep or posterior cervical adenopathy.  Skin:    General: Skin is warm.     Capillary Refill: Capillary refill takes less than 2 seconds.  Neurological:     General: No focal deficit present.     Mental Status: He is alert, oriented to person, place, and time and easily aroused. Mental status is at baseline.     GCS: GCS eye subscore is 4. GCS verbal subscore is 5. GCS motor subscore is 6.     Motor: No weakness.  Psychiatric:        Attention and Perception: Attention and perception normal.        Mood and Affect: Mood and affect normal.        Speech: Speech normal.        Behavior: Behavior normal. Behavior is cooperative.        Thought Content: Thought content normal. Thought content does not include  homicidal or suicidal ideation. Thought content does not include homicidal or suicidal plan.        Cognition and Memory: Cognition and memory normal.        Judgment: Judgment normal.    Assessment & Plan:  [name] was seen today for acute visit.  Diagnoses and all orders for this visit:  Sore throat Patient was negative on rapid screen today. Culture sent. Will communicate results to patient once available. Patient declined testing for covid, flu, rsv. Encouraged supportive and symptomatic care.  -     Rapid Strep Screen ([facility] ONLY) -     Culture, Group A Strep  The above assessment and management plan was discussed with the patient. The patient verbalized understanding of and has agreed to the management plan using shared-decision making. Patient is aware to call the clinic if they develop any new symptoms or if symptoms fail to improve or worsen. Patient is aware when to return to the clinic for a follow-up visit. Patient educated on when it is appropriate to go to the emergency department.   Return if symptoms worsen or fail to improve.  Neale Burly, DNP-FNP [facility] [address] [place], [place] [address] [phone]

## 2022-12-23 NOTE — Addendum Note (Signed)
Addended by: Angela Nevin D on: 12/23/2022 02:15 PM   Modules accepted: Orders

## 2022-12-25 LAB — CULTURE, GROUP A STREP: Strep A Culture: NEGATIVE

## 2022-12-28 ENCOUNTER — Encounter: Payer: Self-pay | Admitting: Family Medicine

## 2022-12-28 ENCOUNTER — Ambulatory Visit: Payer: Medicare PPO | Admitting: Family Medicine

## 2022-12-28 VITALS — BP 137/67 | HR 65 | Ht 73.0 in | Wt 293.0 lb

## 2022-12-28 DIAGNOSIS — E1169 Type 2 diabetes mellitus with other specified complication: Secondary | ICD-10-CM

## 2022-12-28 DIAGNOSIS — Z7984 Long term (current) use of oral hypoglycemic drugs: Secondary | ICD-10-CM | POA: Diagnosis not present

## 2022-12-28 DIAGNOSIS — E785 Hyperlipidemia, unspecified: Secondary | ICD-10-CM | POA: Diagnosis not present

## 2022-12-28 DIAGNOSIS — E1159 Type 2 diabetes mellitus with other circulatory complications: Secondary | ICD-10-CM | POA: Diagnosis not present

## 2022-12-28 DIAGNOSIS — I152 Hypertension secondary to endocrine disorders: Secondary | ICD-10-CM

## 2022-12-28 LAB — BAYER DCA HB A1C WAIVED: HB A1C (BAYER DCA - WAIVED): 6.1 % — ABNORMAL HIGH (ref 4.8–5.6)

## 2022-12-28 NOTE — Progress Notes (Signed)
BP 137/67   Pulse 65   Ht 6\' 1"  (1.854 m)   Wt 293 lb (132.9 kg)   SpO2 98%   BMI 38.66 kg/m    Subjective:   Patient ID: Brett Jensen, male    DOB: 11/11/1945, 77 y.o.   MRN: [id number]  HPI: Brett Jensen is a 77 y.o. male presenting on 12/28/2022 for Medical Management of Chronic Issues and Diabetes   HPI Type 2 diabetes mellitus Patient comes in today for recheck of his diabetes. Patient has been currently taking Actos. Patient is currently on an ACE inhibitor/ARB. Patient has not seen an ophthalmologist this year. Patient denies any new issues with their feet. The symptom started onset as an adult hypertension and hyperlipidemia ARE RELATED TO DM   Hypertension Patient is currently on lisinopril, and their blood pressure today is 137/67. Patient denies any lightheadedness or dizziness. Patient denies headaches, blurred vision, chest pains, shortness of breath, or weakness. Denies any side effects from medication and is content with current medication.   Hyperlipidemia Patient is coming in for recheck of his hyperlipidemia. The patient is currently taking Crestor.  He does complain of some myalgias occasionally.  They deny any issues history of liver damage from it. They deny any focal numbness or weakness or chest pain.   Relevant past medical, surgical, family and social history reviewed and updated as indicated. Interim medical history since our last visit reviewed. Allergies and medications reviewed and updated.  Review of Systems  Constitutional:  Negative for chills and fever.  Eyes:  Negative for visual disturbance.  Respiratory:  Negative for shortness of breath and wheezing.   Cardiovascular:  Negative for chest pain and leg swelling.  Musculoskeletal:  Negative for back pain and gait problem.  Skin:  Negative for rash.  Neurological:  Negative for dizziness, weakness and light-headedness.  All other systems reviewed and are negative.   Per HPI unless  specifically indicated above   Allergies as of 12/28/2022   No Known Allergies      Medication List        Accurate as of Dec 28, 2022  2:38 PM. If you have any questions, ask your nurse or doctor.          Accu-Chek Aviva Plus test strip Generic drug: glucose blood 1 each by Other route 2 (two) times daily. CHECK BLOOD SUGAR ONCE A DAY OR AS DIRECTED Dx E11.9   Accu-Chek Aviva Plus w/Device Kit 1 Device by Does not apply route 2 (two) times daily at 10 AM and 5 PM.   aspirin EC 81 MG tablet Take 81 mg by mouth daily.   desonide 0.05 % cream Commonly known as: DesOwen Apply topically 2 (two) times daily.   lisinopril 5 MG tablet Commonly known as: ZESTRIL TAKE 1 TABLET DAILY   pioglitazone 30 MG tablet Commonly known as: Actos Take 1 tablet (30 mg total) by mouth daily.   rosuvastatin 5 MG tablet Commonly known as: Crestor Take 1 tablet (5 mg total) by mouth at bedtime.   sildenafil 20 MG tablet Commonly known as: REVATIO Take 1-5 tablets (20-100 mg total) by mouth as needed.         Objective:   BP 137/67   Pulse 65   Ht 6\' 1"  (1.854 m)   Wt 293 lb (132.9 kg)   SpO2 98%   BMI 38.66 kg/m   Wt Readings from Last 3 Encounters:  12/28/22 293 lb (132.9 kg)  12/23/22  293 lb (132.9 kg)  09/26/22 287 lb (130.2 kg)    Physical Exam Vitals and nursing note reviewed.  Constitutional:      General: He is not in acute distress.    Appearance: He is well-developed. He is not diaphoretic.  Eyes:     General: No scleral icterus.    Conjunctiva/sclera: Conjunctivae normal.  Neck:     Thyroid: No thyromegaly.  Cardiovascular:     Rate and Rhythm: Normal rate and regular rhythm.     Heart sounds: Normal heart sounds. No murmur heard. Pulmonary:     Effort: Pulmonary effort is normal. No respiratory distress.     Breath sounds: Normal breath sounds. No wheezing.  Musculoskeletal:        General: Swelling (1+ pitting edema bilateral lower extremity,  normal for) present. Normal range of motion.     Cervical back: Neck supple.  Lymphadenopathy:     Cervical: No cervical adenopathy.  Skin:    General: Skin is warm and dry.     Findings: No rash.  Neurological:     Mental Status: He is alert and oriented to person, place, and time.     Coordination: Coordination normal.  Psychiatric:        Behavior: Behavior normal.       Assessment & Plan:   Problem List Items Addressed This Visit       Cardiovascular and Mediastinum   Hypertension associated with diabetes (HCC)     Endocrine   DM (diabetes mellitus) (HCC) - Primary   Relevant Orders   CBC with Differential/Platelet   Bayer DCA Hb A1c Waived (Completed)   Hyperlipidemia associated with type 2 diabetes mellitus (HCC)    Recommended he try the Crestor every other day and see if we can keep going at that level.  A1c was good at 6.1. Follow up plan: Return in about 3 months (around 03/30/2023), or if symptoms worsen or fail to improve, for dm and hld.  Counseling provided for all of the vaccine components Orders Placed This Encounter  Procedures   CBC with Differential/Platelet   Bayer DCA Hb A1c Waived    Arville Care, MD [facility] 12/28/2022, 2:38 PM

## 2022-12-29 LAB — CBC WITH DIFFERENTIAL/PLATELET
Basophils Absolute: 0 10*3/uL (ref 0.0–0.2)
Basos: 0 %
EOS (ABSOLUTE): 0.2 10*3/uL (ref 0.0–0.4)
Eos: 2 %
Hematocrit: 36.7 % — ABNORMAL LOW (ref 37.5–51.0)
Hemoglobin: 12.2 g/dL — ABNORMAL LOW (ref 13.0–17.7)
Immature Grans (Abs): 0 10*3/uL (ref 0.0–0.1)
Immature Granulocytes: 0 %
Lymphocytes Absolute: 2.2 10*3/uL (ref 0.7–3.1)
Lymphs: 35 %
MCH: 29.8 pg (ref 26.6–33.0)
MCHC: 33.2 g/dL (ref 31.5–35.7)
MCV: 90 fL (ref 79–97)
Monocytes Absolute: 0.5 10*3/uL (ref 0.1–0.9)
Monocytes: 8 %
Neutrophils Absolute: 3.4 10*3/uL (ref 1.4–7.0)
Neutrophils: 55 %
Platelets: 329 10*3/uL (ref 150–450)
RBC: 4.09 x10E6/uL — ABNORMAL LOW (ref 4.14–5.80)
RDW: 12.5 % (ref 11.6–15.4)
WBC: 6.3 10*3/uL (ref 3.4–10.8)

## 2022-12-30 NOTE — Progress Notes (Signed)
Duplicate. Previously addressed.

## 2023-03-06 ENCOUNTER — Encounter: Payer: Self-pay | Admitting: *Deleted

## 2023-03-27 ENCOUNTER — Other Ambulatory Visit: Payer: Self-pay | Admitting: Family Medicine

## 2023-03-27 DIAGNOSIS — E1159 Type 2 diabetes mellitus with other circulatory complications: Secondary | ICD-10-CM

## 2023-03-31 ENCOUNTER — Ambulatory Visit: Payer: Medicare PPO | Admitting: Family Medicine

## 2023-04-17 ENCOUNTER — Encounter: Payer: Self-pay | Admitting: Family Medicine

## 2023-04-17 ENCOUNTER — Ambulatory Visit: Payer: Medicare PPO | Admitting: Family Medicine

## 2023-04-17 VITALS — BP 139/57 | HR 69 | Temp 98.1°F | Ht 72.0 in | Wt 293.0 lb

## 2023-04-17 DIAGNOSIS — G4700 Insomnia, unspecified: Secondary | ICD-10-CM | POA: Diagnosis not present

## 2023-04-17 DIAGNOSIS — E1159 Type 2 diabetes mellitus with other circulatory complications: Secondary | ICD-10-CM

## 2023-04-17 DIAGNOSIS — E785 Hyperlipidemia, unspecified: Secondary | ICD-10-CM

## 2023-04-17 DIAGNOSIS — Z23 Encounter for immunization: Secondary | ICD-10-CM

## 2023-04-17 DIAGNOSIS — E1169 Type 2 diabetes mellitus with other specified complication: Secondary | ICD-10-CM

## 2023-04-17 DIAGNOSIS — I152 Hypertension secondary to endocrine disorders: Secondary | ICD-10-CM | POA: Diagnosis not present

## 2023-04-17 LAB — CBC WITH DIFFERENTIAL/PLATELET
Basophils Absolute: 0 10*3/uL (ref 0.0–0.2)
Basos: 0 %
EOS (ABSOLUTE): 0.2 10*3/uL (ref 0.0–0.4)
Eos: 3 %
Hematocrit: 42.2 % (ref 37.5–51.0)
Hemoglobin: 13.5 g/dL (ref 13.0–17.7)
Immature Grans (Abs): 0 10*3/uL (ref 0.0–0.1)
Immature Granulocytes: 0 %
Lymphocytes Absolute: 2.3 10*3/uL (ref 0.7–3.1)
Lymphs: 34 %
MCH: 29.1 pg (ref 26.6–33.0)
MCHC: 32 g/dL (ref 31.5–35.7)
MCV: 91 fL (ref 79–97)
Monocytes Absolute: 0.6 10*3/uL (ref 0.1–0.9)
Monocytes: 9 %
Neutrophils Absolute: 3.6 10*3/uL (ref 1.4–7.0)
Neutrophils: 54 %
Platelets: 263 10*3/uL (ref 150–450)
RBC: 4.64 x10E6/uL (ref 4.14–5.80)
RDW: 13.2 % (ref 11.6–15.4)
WBC: 6.7 10*3/uL (ref 3.4–10.8)

## 2023-04-17 LAB — CMP14+EGFR
ALT: 11 IU/L (ref 0–44)
AST: 18 IU/L (ref 0–40)
Albumin: 4.2 g/dL (ref 3.8–4.8)
Alkaline Phosphatase: 70 IU/L (ref 44–121)
BUN/Creatinine Ratio: 14 (ref 10–24)
BUN: 16 mg/dL (ref 8–27)
Bilirubin Total: 0.9 mg/dL (ref 0.0–1.2)
CO2: 24 mmol/L (ref 20–29)
Calcium: 9.3 mg/dL (ref 8.6–10.2)
Chloride: 102 mmol/L (ref 96–106)
Creatinine, Ser: 1.12 mg/dL (ref 0.76–1.27)
Globulin, Total: 2.5 g/dL (ref 1.5–4.5)
Glucose: 110 mg/dL — ABNORMAL HIGH (ref 70–99)
Potassium: 5.2 mmol/L (ref 3.5–5.2)
Sodium: 139 mmol/L (ref 134–144)
Total Protein: 6.7 g/dL (ref 6.0–8.5)
eGFR: 68 mL/min/{1.73_m2} (ref >59)

## 2023-04-17 LAB — LIPID PANEL
Chol/HDL Ratio: 3.4 ratio (ref 0.0–5.0)
Cholesterol, Total: 159 mg/dL (ref 100–199)
HDL: 47 mg/dL (ref >39)
LDL Chol Calc (NIH): 95 mg/dL (ref 0–99)
Triglycerides: 93 mg/dL (ref 0–149)
VLDL Cholesterol Cal: 17 mg/dL (ref 5–40)

## 2023-04-17 LAB — BAYER DCA HB A1C WAIVED: HB A1C (BAYER DCA - WAIVED): 5.8 % — ABNORMAL HIGH (ref 4.8–5.6)

## 2023-04-17 MED ORDER — TRAZODONE HCL 50 MG PO TABS
25.0000 mg | ORAL_TABLET | Freq: Every evening | ORAL | 3 refills | Status: AC | PRN
Start: 2023-04-17 — End: ?

## 2023-04-17 NOTE — Progress Notes (Signed)
BP (!) 139/57   Pulse 69   Temp 98.1 F (36.7 C)   Ht 6' (1.829 m)   Wt 293 lb (132.9 kg)   SpO2 99%   BMI 39.74 kg/m    Subjective:   Patient ID: Brett Jensen, male    DOB: 05-10-1946, 77 y.o.   MRN: [id number]  HPI: Brett Jensen is a 77 y.o. male presenting on 04/17/2023 for Insomnia (3 - 4 month - not staying alseep) and Medical Management of Chronic Issues (3 month)   HPI Type 2 diabetes mellitus Patient comes in today for recheck of his diabetes. Patient has been currently taking Actos. Patient is currently on an ACE inhibitor/ARB. Patient has seen an ophthalmologist this year. Patient denies any new issues with their feet. The symptom started onset as an adult hypertension and hyperlipidemia ARE RELATED TO DM   Hypertension Patient is currently on lisinopril, and their blood pressure today is 139/57. Patient denies any lightheadedness or dizziness. Patient denies headaches, blurred vision, chest pains, shortness of breath, or weakness. Denies any side effects from medication and is content with current medication.   Hyperlipidemia Patient is coming in for recheck of his hyperlipidemia. The patient is currently taking Crestor. They deny any issues with myalgias or history of liver damage from it. They deny any focal numbness or weakness or chest pain.   Insomnia Patient has been having trouble with insomnia.  He says that he has trouble falling asleep sometimes and then will wake up at 2 AM and has trouble falling back asleep.  He says sometimes he only sleeps for 4 to 5 hours.  He has not taken anything for this yet.  Relevant past medical, surgical, family and social history reviewed and updated as indicated. Interim medical history since our last visit reviewed. Allergies and medications reviewed and updated.  Review of Systems  Constitutional:  Negative for chills and fever.  Eyes:  Negative for visual disturbance.  Respiratory:  Negative for shortness of breath and  wheezing.   Cardiovascular:  Negative for chest pain and leg swelling.  Skin:  Negative for rash.  Psychiatric/Behavioral:  Positive for sleep disturbance.   All other systems reviewed and are negative.   Per HPI unless specifically indicated above   Allergies as of 04/17/2023   No Known Allergies      Medication List        Accurate as of April 17, 2023 10:47 AM. If you have any questions, ask your nurse or doctor.          Accu-Chek Aviva Plus test strip Generic drug: glucose blood 1 each by Other route 2 (two) times daily. CHECK BLOOD SUGAR ONCE A DAY OR AS DIRECTED Dx E11.9   Accu-Chek Aviva Plus w/Device Kit 1 Device by Does not apply route 2 (two) times daily at 10 AM and 5 PM.   aspirin EC 81 MG tablet Take 81 mg by mouth daily.   desonide 0.05 % cream Commonly known as: DesOwen Apply topically 2 (two) times daily.   lisinopril 5 MG tablet Commonly known as: ZESTRIL TAKE 1 TABLET DAILY   pioglitazone 30 MG tablet Commonly known as: ACTOS TAKE ONE TABLET ONCE DAILY   rosuvastatin 5 MG tablet Commonly known as: Crestor Take 1 tablet (5 mg total) by mouth at bedtime.   sildenafil 20 MG tablet Commonly known as: REVATIO Take 1-5 tablets (20-100 mg total) by mouth as needed.   traZODone 50 MG tablet Commonly known  as: DESYREL Take 0.5-1 tablets (25-50 mg total) by mouth at bedtime as needed for sleep. Started by: Elige Radon Suzy Kugel         Objective:   BP (!) 139/57   Pulse 69   Temp 98.1 F (36.7 C)   Ht 6' (1.829 m)   Wt 293 lb (132.9 kg)   SpO2 99%   BMI 39.74 kg/m   Wt Readings from Last 3 Encounters:  04/17/23 293 lb (132.9 kg)  12/28/22 293 lb (132.9 kg)  12/23/22 293 lb (132.9 kg)    Physical Exam Vitals and nursing note reviewed.  Constitutional:      General: He is not in acute distress.    Appearance: He is well-developed. He is not diaphoretic.  Eyes:     General: No scleral icterus.    Conjunctiva/sclera:  Conjunctivae normal.  Neck:     Thyroid: No thyromegaly.  Cardiovascular:     Rate and Rhythm: Normal rate and regular rhythm.     Heart sounds: Normal heart sounds. No murmur heard. Pulmonary:     Effort: Pulmonary effort is normal. No respiratory distress.     Breath sounds: Normal breath sounds. No wheezing.  Musculoskeletal:        General: Swelling (2+ peripheral edema, baseline for him) present. Normal range of motion.     Cervical back: Neck supple.  Lymphadenopathy:     Cervical: No cervical adenopathy.  Skin:    General: Skin is warm and dry.     Findings: No rash.  Neurological:     Mental Status: He is alert and oriented to person, place, and time.     Coordination: Coordination normal.  Psychiatric:        Behavior: Behavior normal.       Assessment & Plan:   Problem List Items Addressed This Visit       Cardiovascular and Mediastinum   Hypertension associated with diabetes (HCC)   Relevant Orders   Bayer DCA Hb A1c Waived   CBC with Differential/Platelet   CMP14+EGFR   Lipid panel     Endocrine   DM (diabetes mellitus) (HCC) - Primary   Relevant Orders   Bayer DCA Hb A1c Waived   CBC with Differential/Platelet   CMP14+EGFR   Lipid panel   Hyperlipidemia associated with type 2 diabetes mellitus (HCC)   Relevant Orders   Bayer DCA Hb A1c Waived   CBC with Differential/Platelet   CMP14+EGFR   Lipid panel   Other Visit Diagnoses     Insomnia, unspecified type       Relevant Medications   traZODone (DESYREL) 50 MG tablet       A1c looks good at 5.8.  Will try trazodone for insomnia and see if it helps.  Blood pressure and blood sugars all look great as well. Follow up plan: Return in about 3 months (around 07/17/2023), or if symptoms worsen or fail to improve, for Diabetes recheck.  Counseling provided for all of the vaccine components Orders Placed This Encounter  Procedures   Bayer DCA Hb A1c Waived   CBC with Differential/Platelet    CMP14+EGFR   Lipid panel    Arville Care, MD [facility] 04/17/2023, 10:47 AM

## 2023-05-27 ENCOUNTER — Other Ambulatory Visit: Payer: Self-pay | Admitting: Family Medicine

## 2023-07-06 ENCOUNTER — Other Ambulatory Visit: Payer: Self-pay | Admitting: Family Medicine

## 2023-07-06 DIAGNOSIS — E1159 Type 2 diabetes mellitus with other circulatory complications: Secondary | ICD-10-CM

## 2023-07-17 ENCOUNTER — Encounter: Payer: Self-pay | Admitting: Family Medicine

## 2023-07-17 ENCOUNTER — Ambulatory Visit: Payer: Medicare PPO | Admitting: Family Medicine

## 2023-07-17 VITALS — BP 155/68 | HR 63 | Ht 72.0 in | Wt 297.0 lb

## 2023-07-17 DIAGNOSIS — Z7984 Long term (current) use of oral hypoglycemic drugs: Secondary | ICD-10-CM

## 2023-07-17 DIAGNOSIS — E785 Hyperlipidemia, unspecified: Secondary | ICD-10-CM

## 2023-07-17 DIAGNOSIS — E1169 Type 2 diabetes mellitus with other specified complication: Secondary | ICD-10-CM | POA: Diagnosis not present

## 2023-07-17 DIAGNOSIS — E1159 Type 2 diabetes mellitus with other circulatory complications: Secondary | ICD-10-CM | POA: Diagnosis not present

## 2023-07-17 DIAGNOSIS — I152 Hypertension secondary to endocrine disorders: Secondary | ICD-10-CM

## 2023-07-17 LAB — BAYER DCA HB A1C WAIVED: HB A1C (BAYER DCA - WAIVED): 6 % — ABNORMAL HIGH (ref 4.8–5.6)

## 2023-07-17 NOTE — Progress Notes (Signed)
BP (!) 155/68   Pulse 63   Ht 6' (1.829 m)   Wt 297 lb (134.7 kg)   SpO2 98%   BMI 40.28 kg/m    Subjective:   Patient ID: Brett Jensen, male    DOB: 19-Feb-1946, 77 y.o.   MRN: [id number]  HPI: Brett Jensen is a 77 y.o. male presenting on 07/17/2023 for Medical Management of Chronic Issues and Diabetes   HPI Type 2 diabetes mellitus Patient comes in today for recheck of his diabetes. Patient has been currently taking Actos. Patient is currently on an ACE inhibitor/ARB. Patient has seen an ophthalmologist this year. Patient denies any new issues with their feet. The symptom started onset as an adult hypertension and hyperlipidemia ARE RELATED TO DM   Hypertension Patient is currently on lisinopril, and their blood pressure today is 155/68. Patient denies any lightheadedness or dizziness. Patient denies headaches, blurred vision, chest pains, shortness of breath, or weakness. Denies any side effects from medication and is content with current medication.   Hyperlipidemia Patient is coming in for recheck of his hyperlipidemia. The patient is currently taking Crestor. They deny any issues with myalgias or history of liver damage from it. They deny any focal numbness or weakness or chest pain.   Relevant past medical, surgical, family and social history reviewed and updated as indicated. Interim medical history since our last visit reviewed. Allergies and medications reviewed and updated.  Review of Systems  Constitutional:  Negative for chills and fever.  Eyes:  Negative for visual disturbance.  Respiratory:  Negative for shortness of breath and wheezing.   Cardiovascular:  Negative for chest pain and leg swelling.  Skin:  Negative for rash.  Neurological:  Negative for dizziness, weakness and light-headedness.  All other systems reviewed and are negative.   Per HPI unless specifically indicated above   Allergies as of 07/17/2023   No Known Allergies      Medication List         Accurate as of July 17, 2023  9:12 AM. If you have any questions, ask your nurse or doctor.          Accu-Chek Aviva Plus test strip Generic drug: glucose blood Check BS twice daily Dx E11.69   Accu-Chek Aviva Plus w/Device Kit 1 Device by Does not apply route 2 (two) times daily at 10 AM and 5 PM.   aspirin EC 81 MG tablet Take 81 mg by mouth daily.   desonide 0.05 % cream Commonly known as: DesOwen Apply topically 2 (two) times daily.   lisinopril 5 MG tablet Commonly known as: ZESTRIL TAKE 1 TABLET DAILY   pioglitazone 30 MG tablet Commonly known as: ACTOS TAKE ONE TABLET ONCE DAILY   rosuvastatin 5 MG tablet Commonly known as: CRESTOR TAKE ONE TABLET BY MOUTH AT BEDTIME   sildenafil 20 MG tablet Commonly known as: REVATIO Take 1-5 tablets (20-100 mg total) by mouth as needed.   traZODone 50 MG tablet Commonly known as: DESYREL Take 0.5-1 tablets (25-50 mg total) by mouth at bedtime as needed for sleep.         Objective:   BP (!) 155/68   Pulse 63   Ht 6' (1.829 m)   Wt 297 lb (134.7 kg)   SpO2 98%   BMI 40.28 kg/m   Wt Readings from Last 3 Encounters:  07/17/23 297 lb (134.7 kg)  04/17/23 293 lb (132.9 kg)  12/28/22 293 lb (132.9 kg)    Physical  Exam Vitals and nursing note reviewed.  Constitutional:      General: He is not in acute distress.    Appearance: He is well-developed. He is not diaphoretic.  Eyes:     General: No scleral icterus.    Conjunctiva/sclera: Conjunctivae normal.  Neck:     Thyroid: No thyromegaly.  Cardiovascular:     Rate and Rhythm: Normal rate and regular rhythm.     Heart sounds: Normal heart sounds. No murmur heard. Pulmonary:     Effort: Pulmonary effort is normal. No respiratory distress.     Breath sounds: Normal breath sounds. No wheezing.  Musculoskeletal:        General: Normal range of motion.     Cervical back: Neck supple.  Lymphadenopathy:     Cervical: No cervical adenopathy.   Skin:    General: Skin is warm and dry.     Findings: No rash.  Neurological:     Mental Status: He is alert and oriented to person, place, and time.     Coordination: Coordination normal.  Psychiatric:        Behavior: Behavior normal.     Results for orders placed or performed in visit on 04/17/23  HM DIABETES EYE EXAM   Collection Time: 12/20/22 12:00 AM  Result Value Ref Range   HM Diabetic Eye Exam No Retinopathy No Retinopathy    Assessment & Plan:   Problem List Items Addressed This Visit       Cardiovascular and Mediastinum   Hypertension associated with diabetes (HCC)     Endocrine   DM (diabetes mellitus) (HCC) - Primary   Relevant Orders   Bayer DCA Hb A1c Waived   Hyperlipidemia associated with type 2 diabetes mellitus (HCC)    A1c looks great at 6.0, continue with diet, seems to be doing well.  Blood pressure was slightly elevated at 147/66 and he will keep an eye on it at home but he thinks it runs in the 130s at home consistently.  No changes today Follow up plan: Return in about 3 months (around 10/15/2023), or if symptoms worsen or fail to improve, for Diabetes hypertension and cholesterol recheck.  Counseling provided for all of the vaccine components Orders Placed This Encounter  Procedures   Bayer DCA Hb A1c Waived    Arville Care, MD [facility] 07/17/2023, 9:12 AM

## 2023-08-11 ENCOUNTER — Encounter: Payer: Self-pay | Admitting: Family Medicine

## 2023-08-11 ENCOUNTER — Ambulatory Visit: Payer: Medicare PPO | Admitting: Family Medicine

## 2023-08-11 VITALS — BP 168/76 | HR 68 | Ht 72.0 in | Wt 301.0 lb

## 2023-08-11 DIAGNOSIS — L723 Sebaceous cyst: Secondary | ICD-10-CM | POA: Diagnosis not present

## 2023-08-11 DIAGNOSIS — L089 Local infection of the skin and subcutaneous tissue, unspecified: Secondary | ICD-10-CM | POA: Diagnosis not present

## 2023-08-11 MED ORDER — SULFAMETHOXAZOLE-TRIMETHOPRIM 800-160 MG PO TABS: 1.0000 | ORAL_TABLET | Freq: Two times a day (BID) | ORAL | 0 refills | Status: DC

## 2023-08-11 NOTE — Progress Notes (Signed)
 BP (!) 168/76   Pulse 68   Ht 6' (1.829 m)   Wt (!) 301 lb (136.5 kg)   SpO2 96%   BMI 40.82 kg/m    Subjective:   Patient ID: Brett Jensen, male    DOB: May 24, 1946, 78 y.o.   MRN: [id number]  HPI: Brett Jensen is a 78 y.o. male presenting on 08/11/2023 for Cyst (Right axilla, sore, red)   HPI Sore under right arm  4 days ago he noted a red swollen sore under right axilla. He says it has been painful and sore this whole time and is not improving.  He denies fevers or chills or sores anywhere else.  He has not gotten anything to drain out of it yet.  Relevant past medical, surgical, family and social history reviewed and updated as indicated. Interim medical history since our last visit reviewed. Allergies and medications reviewed and updated.  Review of Systems  Constitutional:  Negative for chills and fever.  Respiratory:  Negative for shortness of breath and wheezing.   Cardiovascular:  Negative for chest pain and leg swelling.  Skin:  Positive for color change. Negative for rash.  All other systems reviewed and are negative.   Per HPI unless specifically indicated above   Allergies as of 08/11/2023   No Known Allergies      Medication List        Accurate as of August 11, 2023  8:28 AM. If you have any questions, ask your nurse or doctor.          Accu-Chek Aviva Plus test strip Generic drug: glucose blood Check BS twice daily Dx E11.69   Accu-Chek Aviva Plus w/Device Kit 1 Device by Does not apply route 2 (two) times daily at 10 AM and 5 PM.   aspirin EC 81 MG tablet Take 81 mg by mouth daily.   desonide  0.05 % cream Commonly known as: DesOwen  Apply topically 2 (two) times daily.   lisinopril  5 MG tablet Commonly known as: ZESTRIL  TAKE 1 TABLET DAILY   pioglitazone  30 MG tablet Commonly known as: ACTOS  TAKE ONE TABLET ONCE DAILY   rosuvastatin  5 MG tablet Commonly known as: CRESTOR  TAKE ONE TABLET BY MOUTH AT BEDTIME   sildenafil  20 MG  tablet Commonly known as: REVATIO  Take 1-5 tablets (20-100 mg total) by mouth as needed.   sulfamethoxazole -trimethoprim  800-160 MG tablet Commonly known as: BACTRIM  DS Take 1 tablet by mouth 2 (two) times daily. Started by: Fonda LABOR Arali Somera   traZODone  50 MG tablet Commonly known as: DESYREL  Take 0.5-1 tablets (25-50 mg total) by mouth at bedtime as needed for sleep.         Objective:   BP (!) 168/76   Pulse 68   Ht 6' (1.829 m)   Wt (!) 301 lb (136.5 kg)   SpO2 96%   BMI 40.82 kg/m   Wt Readings from Last 3 Encounters:  08/11/23 (!) 301 lb (136.5 kg)  07/17/23 297 lb (134.7 kg)  04/17/23 293 lb (132.9 kg)    Physical Exam Vitals and nursing note reviewed.  Constitutional:      Appearance: Normal appearance.  Skin:    General: Skin is warm and dry.     Findings: Erythema and lesion present.       Neurological:     Mental Status: He is alert.       Assessment & Plan:   Problem List Items Addressed This Visit   None Visit Diagnoses  Infected sebaceous cyst    -  Primary   Relevant Medications   sulfamethoxazole -trimethoprim  (BACTRIM  DS) 800-160 MG tablet       Able to express purulent fluid, no need to lance today because it is already draining.  Will give him Bactrim . Follow up plan: Return if symptoms worsen or fail to improve.  Counseling provided for all of the vaccine components No orders of the defined types were placed in this encounter.   Fonda Levins, MD [facility] 08/11/2023, 8:28 AM

## 2023-08-11 NOTE — Progress Notes (Signed)
 SUBJECTIVE: HPI: Brett Jensen is a 78 y.o male that comes in for a knot under my right arm. Noticed it to be painful 4 days ago. Reports that it is sore and tenderness during this span. No drainage noted. Has taking nothing or done anything to try and alleviate the issue.The soreness stays local to that area. Denies fever or chills.  PMH, Surgical, Family, and social history reviewed and no change. Medications and allergies reviewed.  Review of Symptoms: Constitutional: No fever or chills. Skin: Positive for COLOR CHANGE and SORENESS in right axilla Cardiovascular: No chest pain or leg swelling. Pulmonary: No SOB or coughing.  OBJECTIVE: Vitals: HR 68, BP 168/72, RR 16, SpO2 96% of RA, Ht 6', Wt 136.5 kg, BMI 40.82  Physical Exam: Constitutional: No fever or diaphoresis General: No acute distress noted, well developed. Skin: Dry and warm. Positive finding for 1 CM ERYTHEMATOUS LESION noted in RIGHT axilla. Tender to touch. Able to express thick, yellow purulent fluid. Neuro: A/A x 4/4 behavior.  ASSESSMENT: Primary issues is an infected sebaceous cyst in the right axilla.  PLAN: Give SULFAMETHOXAZOLE -TRIMETHOPRIM  (BACTRIM ) 800-160 MG tablet. Lancing area was not performed due to being able to express fluid. Notify PCP if symptoms worsen or issue arises again with no relief.  Vicenta Hummer PA-S 08/11/2023

## 2023-08-17 DIAGNOSIS — I10 Essential (primary) hypertension: Secondary | ICD-10-CM | POA: Diagnosis not present

## 2023-08-17 DIAGNOSIS — M47816 Spondylosis without myelopathy or radiculopathy, lumbar region: Secondary | ICD-10-CM | POA: Diagnosis not present

## 2023-08-17 DIAGNOSIS — M5136 Other intervertebral disc degeneration, lumbar region with discogenic back pain only: Secondary | ICD-10-CM | POA: Diagnosis not present

## 2023-08-17 DIAGNOSIS — M25552 Pain in left hip: Secondary | ICD-10-CM | POA: Diagnosis not present

## 2023-08-17 DIAGNOSIS — M4316 Spondylolisthesis, lumbar region: Secondary | ICD-10-CM | POA: Diagnosis not present

## 2023-08-17 DIAGNOSIS — Z7982 Long term (current) use of aspirin: Secondary | ICD-10-CM | POA: Diagnosis not present

## 2023-08-17 DIAGNOSIS — E119 Type 2 diabetes mellitus without complications: Secondary | ICD-10-CM | POA: Diagnosis not present

## 2023-08-17 DIAGNOSIS — Z7984 Long term (current) use of oral hypoglycemic drugs: Secondary | ICD-10-CM | POA: Diagnosis not present

## 2023-08-17 DIAGNOSIS — M5432 Sciatica, left side: Secondary | ICD-10-CM | POA: Diagnosis not present

## 2023-08-17 DIAGNOSIS — Z79899 Other long term (current) drug therapy: Secondary | ICD-10-CM | POA: Diagnosis not present

## 2023-08-17 DIAGNOSIS — F17210 Nicotine dependence, cigarettes, uncomplicated: Secondary | ICD-10-CM | POA: Diagnosis not present

## 2023-08-21 ENCOUNTER — Ambulatory Visit: Payer: Medicare PPO | Admitting: Nurse Practitioner

## 2023-08-31 ENCOUNTER — Ambulatory Visit: Payer: Medicare PPO | Admitting: Family Medicine

## 2023-09-06 DIAGNOSIS — M4316 Spondylolisthesis, lumbar region: Secondary | ICD-10-CM | POA: Diagnosis not present

## 2023-09-06 DIAGNOSIS — M51361 Other intervertebral disc degeneration, lumbar region with lower extremity pain only: Secondary | ICD-10-CM | POA: Diagnosis not present

## 2023-09-06 DIAGNOSIS — M47816 Spondylosis without myelopathy or radiculopathy, lumbar region: Secondary | ICD-10-CM | POA: Diagnosis not present

## 2023-09-06 DIAGNOSIS — M47817 Spondylosis without myelopathy or radiculopathy, lumbosacral region: Secondary | ICD-10-CM | POA: Diagnosis not present

## 2023-09-06 DIAGNOSIS — M51369 Other intervertebral disc degeneration, lumbar region without mention of lumbar back pain or lower extremity pain: Secondary | ICD-10-CM | POA: Diagnosis not present

## 2023-09-17 DIAGNOSIS — M51361 Other intervertebral disc degeneration, lumbar region with lower extremity pain only: Secondary | ICD-10-CM | POA: Diagnosis not present

## 2023-09-17 DIAGNOSIS — M51369 Other intervertebral disc degeneration, lumbar region without mention of lumbar back pain or lower extremity pain: Secondary | ICD-10-CM | POA: Diagnosis not present

## 2023-09-17 DIAGNOSIS — M4316 Spondylolisthesis, lumbar region: Secondary | ICD-10-CM | POA: Diagnosis not present

## 2023-09-17 DIAGNOSIS — M48061 Spinal stenosis, lumbar region without neurogenic claudication: Secondary | ICD-10-CM | POA: Diagnosis not present

## 2023-09-17 DIAGNOSIS — M47816 Spondylosis without myelopathy or radiculopathy, lumbar region: Secondary | ICD-10-CM | POA: Diagnosis not present

## 2023-09-17 DIAGNOSIS — M5126 Other intervertebral disc displacement, lumbar region: Secondary | ICD-10-CM | POA: Diagnosis not present

## 2023-10-26 ENCOUNTER — Encounter: Payer: Self-pay | Admitting: Family Medicine

## 2023-10-26 ENCOUNTER — Ambulatory Visit: Payer: Medicare PPO | Admitting: Family Medicine

## 2023-10-26 VITALS — BP 149/72 | HR 61 | Ht 72.0 in | Wt 301.0 lb

## 2023-10-26 DIAGNOSIS — G4700 Insomnia, unspecified: Secondary | ICD-10-CM

## 2023-10-26 DIAGNOSIS — E785 Hyperlipidemia, unspecified: Secondary | ICD-10-CM | POA: Diagnosis not present

## 2023-10-26 DIAGNOSIS — E1169 Type 2 diabetes mellitus with other specified complication: Secondary | ICD-10-CM

## 2023-10-26 DIAGNOSIS — E1159 Type 2 diabetes mellitus with other circulatory complications: Secondary | ICD-10-CM

## 2023-10-26 DIAGNOSIS — I152 Hypertension secondary to endocrine disorders: Secondary | ICD-10-CM

## 2023-10-26 LAB — LIPID PANEL

## 2023-10-26 LAB — BAYER DCA HB A1C WAIVED: HB A1C (BAYER DCA - WAIVED): 5.7 % — ABNORMAL HIGH (ref 4.8–5.6)

## 2023-10-26 MED ORDER — DESONIDE 0.05 % EX CREA: TOPICAL_CREAM | Freq: Two times a day (BID) | CUTANEOUS | 1 refills | Status: AC

## 2023-10-26 MED ORDER — PIOGLITAZONE HCL 30 MG PO TABS: 30.0000 mg | ORAL_TABLET | Freq: Every day | ORAL | 3 refills | Status: AC

## 2023-10-26 MED ORDER — ROSUVASTATIN CALCIUM 5 MG PO TABS
5.0000 mg | ORAL_TABLET | Freq: Every day | ORAL | 3 refills | Status: AC
Start: 2023-10-26 — End: ?

## 2023-10-26 MED ORDER — LISINOPRIL 5 MG PO TABS: 5.0000 mg | ORAL_TABLET | Freq: Every day | ORAL | 3 refills | Status: AC

## 2023-10-26 NOTE — Progress Notes (Signed)
 BP (!) 149/72   Pulse 61   Ht 6' (1.829 m)   Wt (!) 301 lb (136.5 kg)   SpO2 97%   BMI 40.82 kg/m    Subjective:   Patient ID: Brett Jensen, male    DOB: 03/19/1946, 78 y.o.   MRN: [id number]  HPI: Brett Jensen is a 78 y.o. male presenting on 10/26/2023 for Medical Management of Chronic Issues, Diabetes, Hyperlipidemia, and Hypertension   HPI Type 2 diabetes mellitus Patient comes in today for recheck of his diabetes. Patient has been currently taking Actos. Patient is currently on an ACE inhibitor/ARB. Patient has not seen an ophthalmologist this year. Patient denies any new issues with their feet. The symptom started onset as an adult hypertension and hyperlipidemia ARE RELATED TO DM   Hypertension Patient is currently on lisinopril, and their blood pressure today is 149/72, he says at home this morning it was 110/50. Patient denies any lightheadedness or dizziness. Patient denies headaches, blurred vision, chest pains, shortness of breath, or weakness. Denies any side effects from medication and is content with current medication.   Hyperlipidemia Patient is coming in for recheck of his hyperlipidemia. The patient is currently taking Crestor. They deny any issues with myalgias or history of liver damage from it. They deny any focal numbness or weakness or chest pain.   Relevant past medical, surgical, family and social history reviewed and updated as indicated. Interim medical history since our last visit reviewed. Allergies and medications reviewed and updated.  Review of Systems  Constitutional:  Negative for chills and fever.  Eyes:  Negative for visual disturbance.  Respiratory:  Negative for shortness of breath and wheezing.   Cardiovascular:  Negative for chest pain and leg swelling.  Musculoskeletal:  Negative for back pain and gait problem.  Skin:  Negative for rash.  Neurological:  Negative for dizziness and light-headedness.  All other systems reviewed and are  negative.   Per HPI unless specifically indicated above   Allergies as of 10/26/2023   No Known Allergies      Medication List        Accurate as of October 26, 2023  9:06 AM. If you have any questions, ask your nurse or doctor.          STOP taking these medications    sulfamethoxazole-trimethoprim 800-160 MG tablet Commonly known as: BACTRIM DS Stopped by: Elige Radon Costa Jha       TAKE these medications    Accu-Chek Aviva Plus test strip Generic drug: glucose blood Check BS twice daily Dx E11.69   Accu-Chek Aviva Plus w/Device Kit 1 Device by Does not apply route 2 (two) times daily at 10 AM and 5 PM.   aspirin EC 81 MG tablet Take 81 mg by mouth daily.   desonide 0.05 % cream Commonly known as: DesOwen Apply topically 2 (two) times daily.   lisinopril 5 MG tablet Commonly known as: ZESTRIL Take 1 tablet (5 mg total) by mouth daily.   pioglitazone 30 MG tablet Commonly known as: ACTOS Take 1 tablet (30 mg total) by mouth daily.   rosuvastatin 5 MG tablet Commonly known as: CRESTOR Take 1 tablet (5 mg total) by mouth at bedtime.   sildenafil 20 MG tablet Commonly known as: REVATIO Take 1-5 tablets (20-100 mg total) by mouth as needed.   traZODone 50 MG tablet Commonly known as: DESYREL Take 0.5-1 tablets (25-50 mg total) by mouth at bedtime as needed for sleep.  Objective:   BP (!) 149/72   Pulse 61   Ht 6' (1.829 m)   Wt (!) 301 lb (136.5 kg)   SpO2 97%   BMI 40.82 kg/m   Wt Readings from Last 3 Encounters:  10/26/23 (!) 301 lb (136.5 kg)  08/11/23 (!) 301 lb (136.5 kg)  07/17/23 297 lb (134.7 kg)    Physical Exam Vitals and nursing note reviewed.  Constitutional:      General: He is not in acute distress.    Appearance: He is well-developed. He is not diaphoretic.  Eyes:     General: No scleral icterus.    Conjunctiva/sclera: Conjunctivae normal.  Neck:     Thyroid: No thyromegaly.  Cardiovascular:     Rate and  Rhythm: Normal rate and regular rhythm.     Heart sounds: Normal heart sounds. No murmur heard. Pulmonary:     Effort: Pulmonary effort is normal. No respiratory distress.     Breath sounds: Normal breath sounds. No wheezing.  Musculoskeletal:        General: No swelling. Normal range of motion.     Cervical back: Neck supple.  Lymphadenopathy:     Cervical: No cervical adenopathy.  Skin:    General: Skin is warm and dry.     Findings: No rash.  Neurological:     Mental Status: He is alert and oriented to person, place, and time.     Coordination: Coordination normal.  Psychiatric:        Behavior: Behavior normal.       Assessment & Plan:   Problem List Items Addressed This Visit       Cardiovascular and Mediastinum   Hypertension associated with diabetes (HCC)   Relevant Medications   lisinopril (ZESTRIL) 5 MG tablet   pioglitazone (ACTOS) 30 MG tablet   rosuvastatin (CRESTOR) 5 MG tablet   Other Relevant Orders   CBC with Differential/Platelet   CMP14+EGFR   Lipid panel   PSA, total and free   Bayer DCA Hb A1c Waived     Endocrine   DM (diabetes mellitus) (HCC)   Relevant Medications   lisinopril (ZESTRIL) 5 MG tablet   pioglitazone (ACTOS) 30 MG tablet   rosuvastatin (CRESTOR) 5 MG tablet   Other Relevant Orders   CBC with Differential/Platelet   CMP14+EGFR   Lipid panel   PSA, total and free   Bayer DCA Hb A1c Waived   Microalbumin/Creatinine Ratio, Urine   Hyperlipidemia associated with type 2 diabetes mellitus (HCC) - Primary   Relevant Medications   lisinopril (ZESTRIL) 5 MG tablet   pioglitazone (ACTOS) 30 MG tablet   rosuvastatin (CRESTOR) 5 MG tablet   Other Visit Diagnoses       Insomnia, unspecified type           A1c looks good at 5.7.  Blood pressure at home seems to be running well, instructed patient to continue to monitor his blood pressures.  To leave urine on the way out today.  Otherwise he seems to be doing really well.  He said  the trazodone did not help as much for insomnia and he will try melatonin Follow up plan: Return in about 3 months (around 01/26/2024), or if symptoms worsen or fail to improve, for Diabetes recheck.  Counseling provided for all of the vaccine components Orders Placed This Encounter  Procedures   CBC with Differential/Platelet   CMP14+EGFR   Lipid panel   PSA, total and free   Bayer DCA Hb  A1c Waived   Microalbumin/Creatinine Ratio, Urine    Arville Care, MD [facility] 10/26/2023, 9:06 AM

## 2023-10-27 DIAGNOSIS — M48061 Spinal stenosis, lumbar region without neurogenic claudication: Secondary | ICD-10-CM | POA: Diagnosis not present

## 2023-10-27 DIAGNOSIS — M5416 Radiculopathy, lumbar region: Secondary | ICD-10-CM | POA: Diagnosis not present

## 2023-10-27 DIAGNOSIS — M5126 Other intervertebral disc displacement, lumbar region: Secondary | ICD-10-CM | POA: Diagnosis not present

## 2023-10-27 LAB — CBC WITH DIFFERENTIAL/PLATELET
Basophils Absolute: 0 10*3/uL (ref 0.0–0.2)
Basos: 1 %
EOS (ABSOLUTE): 0.2 10*3/uL (ref 0.0–0.4)
Eos: 4 %
Hematocrit: 42.3 % (ref 37.5–51.0)
Hemoglobin: 13.7 g/dL (ref 13.0–17.7)
Immature Grans (Abs): 0 10*3/uL (ref 0.0–0.1)
Immature Granulocytes: 0 %
Lymphocytes Absolute: 2.3 10*3/uL (ref 0.7–3.1)
Lymphs: 37 %
MCH: 30.4 pg (ref 26.6–33.0)
MCHC: 32.4 g/dL (ref 31.5–35.7)
MCV: 94 fL (ref 79–97)
Monocytes Absolute: 0.6 10*3/uL (ref 0.1–0.9)
Monocytes: 9 %
Neutrophils Absolute: 3.1 10*3/uL (ref 1.4–7.0)
Neutrophils: 49 %
Platelets: 250 10*3/uL (ref 150–450)
RBC: 4.51 x10E6/uL (ref 4.14–5.80)
RDW: 12.9 % (ref 11.6–15.4)
WBC: 6.3 10*3/uL (ref 3.4–10.8)

## 2023-10-27 LAB — CMP14+EGFR
ALT: 16 IU/L (ref 0–44)
AST: 22 IU/L (ref 0–40)
Albumin: 4.1 g/dL (ref 3.8–4.8)
Alkaline Phosphatase: 62 IU/L (ref 44–121)
BUN/Creatinine Ratio: 17 (ref 10–24)
BUN: 19 mg/dL (ref 8–27)
Bilirubin Total: 1.1 mg/dL (ref 0.0–1.2)
CO2: 23 mmol/L (ref 20–29)
Calcium: 9.7 mg/dL (ref 8.6–10.2)
Chloride: 102 mmol/L (ref 96–106)
Creatinine, Ser: 1.13 mg/dL (ref 0.76–1.27)
Globulin, Total: 2.9 g/dL (ref 1.5–4.5)
Glucose: 104 mg/dL — ABNORMAL HIGH (ref 70–99)
Potassium: 4.8 mmol/L (ref 3.5–5.2)
Sodium: 138 mmol/L (ref 134–144)
Total Protein: 7 g/dL (ref 6.0–8.5)
eGFR: 67 mL/min/{1.73_m2} (ref >59)

## 2023-10-27 LAB — LIPID PANEL
Cholesterol, Total: 159 mg/dL (ref 100–199)
HDL: 47 mg/dL (ref >39)
LDL CALC COMMENT:: 3.4 ratio (ref 0.0–5.0)
LDL Chol Calc (NIH): 94 mg/dL (ref 0–99)
Triglycerides: 98 mg/dL (ref 0–149)
VLDL Cholesterol Cal: 18 mg/dL (ref 5–40)

## 2023-10-27 LAB — PSA, TOTAL AND FREE
PSA, Free Pct: 40 %
PSA, Free: 0.32 ng/mL
Prostate Specific Ag, Serum: 0.8 ng/mL (ref 0.0–4.0)

## 2023-10-27 LAB — MICROALBUMIN / CREATININE URINE RATIO
Creatinine, Urine: 69.8 mg/dL
Microalb/Creat Ratio: 35 mg/g{creat} — ABNORMAL HIGH (ref 0–29)
Microalbumin, Urine: 24.3 ug/mL

## 2023-10-30 ENCOUNTER — Encounter: Payer: Self-pay | Admitting: Family Medicine

## 2023-11-07 ENCOUNTER — Ambulatory Visit: Attending: Nurse Practitioner

## 2023-11-07 DIAGNOSIS — M79604 Pain in right leg: Secondary | ICD-10-CM | POA: Diagnosis not present

## 2023-11-07 DIAGNOSIS — M6281 Muscle weakness (generalized): Secondary | ICD-10-CM | POA: Diagnosis not present

## 2023-11-07 DIAGNOSIS — M5459 Other low back pain: Secondary | ICD-10-CM | POA: Diagnosis not present

## 2023-11-07 NOTE — Therapy (Signed)
 OUTPATIENT PHYSICAL THERAPY THORACOLUMBAR EVALUATION   Patient Name: Brett Jensen MRN: [id number] DOB:July 13, 1946, 78 y.o., male Today's Date: 11/07/2023  END OF SESSION:  PT End of Session - 11/07/23 0832     Visit Number 1    Number of Visits 8    Date for PT Re-Evaluation 01/26/24    PT Start Time 0848    PT Stop Time 0912    PT Time Calculation (min) 24 min    Activity Tolerance Patient tolerated treatment well    Behavior During Therapy [facility] for tasks assessed/performed             Past Medical History:  Diagnosis Date   Arthritis    bilateral knees   Diabetes mellitus without complication (HCC)    Erectile dysfunction    Hyperlipidemia    Hypertension    Restless leg syndrome    Shingles    Past Surgical History:  Procedure Laterality Date   JOINT REPLACEMENT Right    ankle   SPINE SURGERY     lower back   Patient Active Problem List   Diagnosis Date Noted   Lumbar radiculopathy 04/02/2020   Myalgia due to statin 01/03/2018   Arthritis    Hyperlipidemia associated with type 2 diabetes mellitus (HCC)    Morbid obesity (HCC) 02/14/2013   Erectile dysfunction 02/14/2013   Hypertension associated with diabetes (HCC) 11/09/2010   DM (diabetes mellitus) (HCC) 11/09/2010   REFERRING PROVIDER: Jonetta Speak, NP   REFERRING DIAG: Radiculopathy, lumbar region   Rationale for Evaluation and Treatment: Rehabilitation  THERAPY DIAG:  Muscle weakness (generalized)  Other low back pain  ONSET DATE: February 2025  SUBJECTIVE:                                                                                                                                                                                           SUBJECTIVE STATEMENT: Patient reports that he began having low back and left leg pain about 2 months ago. His pain got so bad that he had to go to the emergency department which gave him medication to help with his pain. This seemed to help a lot. He  feels that his pain has gotten better, but he feels like his left leg is really weak. He has not fallen, but he did have to use a cane for safety for a while.   PERTINENT HISTORY:  Hypertension, diabetes, arthritis, current smoker, and previous lumbar surgery  PAIN:  Are you having pain? Yes: NPRS scale: Current: 0/10 Best: 0/10 Worst: 5/10 Pain location: low back and left leg Pain description: primarily weakness  PRECAUTIONS:  None  RED FLAGS: None   WEIGHT BEARING RESTRICTIONS: No  FALLS:  Has patient fallen in last 6 months? No  LIVING ENVIRONMENT: Lives with: lives with their son Lives in: House/apartment Stairs: No Has following equipment at home: None  OCCUPATION: retired  PLOF: Independent  PATIENT GOALS: improved strength  NEXT MD VISIT: 12/15/23  OBJECTIVE:  Note: Objective measures were completed at Evaluation unless otherwise noted.  PATIENT SURVEYS:  Modified Oswestry 40% disability   COGNITION: Overall cognitive status: Within functional limits for tasks assessed     SENSATION: Patient reports no numbness or tingling  LUMBAR ROM:   AROM eval  Flexion 60  Extension 10  Right lateral flexion 75% limited; unable to isolate lateral flexion  Left lateral flexion 75% limited; unable to isolate lateral flexion   Right rotation 50% limited  Left rotation 50% limited   (Blank rows = not tested)  LOWER EXTREMITY ROM: WFL for activities assessed  LOWER EXTREMITY MMT:    MMT Right eval Left eval  Hip flexion 4/5 4-/5  Hip extension    Hip abduction    Hip adduction    Hip internal rotation    Hip external rotation    Knee flexion 4/5 4/5  Knee extension 4/5 4/5  Ankle dorsiflexion 4-/5 4-/5  Ankle plantarflexion    Ankle inversion    Ankle eversion     (Blank rows = not tested)  FUNCTIONAL TESTS:  5 times sit to stand: 32.81 seconds with upper extremity from the armrests Sit to stand: requires upper extremity assistance from armrest,  multiple attempts required   GAIT: Assistive device utilized: None Level of assistance: Complete Independence  TREATMENT DATE:                                                                                                                                  PATIENT EDUCATION:  Education details: POC, healing, prognosis, objective findings, and goals for physical therapy Person educated: Patient Education method: Explanation Education comprehension: verbalized understanding  HOME EXERCISE PROGRAM:   ASSESSMENT:  CLINICAL IMPRESSION: Patient is a 78 y.o. male who was seen today for physical therapy evaluation and treatment for lumbar radiculopathy.  He presented with low pain severity and irritability as none of today's assessments reproduced his familiar low back pain.  However, he exhibited reduced lower extremity muscular strength and power as evidenced by his objective testing.  Recommend that he continue with skilled physical therapy to address his impairments to return to his prior level of function.  OBJECTIVE IMPAIRMENTS: decreased activity tolerance, decreased mobility, decreased ROM, decreased strength, and pain.   ACTIVITY LIMITATIONS: lifting, stairs, and transfers  PARTICIPATION LIMITATIONS: community activity and yard work  PERSONAL FACTORS: Past/current experiences and 3+ comorbidities: Hypertension, diabetes, arthritis, current smoker, and previous lumbar surgery  are also affecting patient's functional outcome.   REHAB POTENTIAL: Good  CLINICAL DECISION MAKING: Evolving/moderate complexity  EVALUATION COMPLEXITY: Moderate  GOALS: Goals reviewed with patient? Yes  SHORT TERM GOALS: Target date: 11/28/23  Patient will be independent with his initial HEP. Baseline: Goal status: INITIAL  2.  Patient will improve his 5 times sit to stand time to 20 seconds or less for improved lower extremity power. Baseline:  Goal status: INITIAL  LONG TERM GOALS: Target  date: 12/19/23  Patient will be independent with his advanced HEP. Baseline:  Goal status: INITIAL  2.  Patient will improve his 5 times sit to stand time to 12 seconds or less to reduce his fall risk. Baseline:  Goal status: INITIAL  3.  Patient will be able to transfer from sitting to standing without upper extremity assistance for improved household independence. Baseline:  Goal status: INITIAL  4.  Patient will improve his modified Oswestry score to 30% disability or less for improved perceived function with his daily activities. Baseline:  Goal status: INITIAL  PLAN:  PT FREQUENCY: 1-2x/week  PT DURATION: 6 weeks  PLANNED INTERVENTIONS: 97164- PT Re-evaluation, 97110-Therapeutic exercises, 97530- Therapeutic activity, 97112- Neuromuscular re-education, 97535- Self Care, [id number]- Manual therapy, G0283- Electrical stimulation (unattended), Patient/Family education, Balance training, Stair training, Joint mobilization, Spinal mobilization, Cryotherapy, and Moist heat.  PLAN FOR NEXT SESSION: Nustep, lumbar and lower extremity strengthening and power interventions   Granville Lewis, PT 11/07/2023, 6:51 PM

## 2023-11-13 ENCOUNTER — Ambulatory Visit: Admitting: Physical Therapy

## 2023-11-13 DIAGNOSIS — M6281 Muscle weakness (generalized): Secondary | ICD-10-CM

## 2023-11-13 DIAGNOSIS — M5459 Other low back pain: Secondary | ICD-10-CM

## 2023-11-13 DIAGNOSIS — M79604 Pain in right leg: Secondary | ICD-10-CM

## 2023-11-13 NOTE — Therapy (Signed)
 OUTPATIENT PHYSICAL THERAPY THORACOLUMBAR EVALUATION   Patient Name: Brett Jensen MRN: [id number] DOB:1945-10-10, 78 y.o., male Today's Date: 11/13/2023  END OF SESSION:  PT End of Session - 11/13/23 0907     Visit Number 2    Number of Visits 8    Date for PT Re-Evaluation 01/26/24    PT Start Time 0845    PT Stop Time 0929    PT Time Calculation (min) 44 min    Activity Tolerance Patient tolerated treatment well    Behavior During Therapy [facility] for tasks assessed/performed              Past Medical History:  Diagnosis Date   Arthritis    bilateral knees   Diabetes mellitus without complication (HCC)    Erectile dysfunction    Hyperlipidemia    Hypertension    Restless leg syndrome    Shingles    Past Surgical History:  Procedure Laterality Date   JOINT REPLACEMENT Right    ankle   SPINE SURGERY     lower back   Patient Active Problem List   Diagnosis Date Noted   Lumbar radiculopathy 04/02/2020   Myalgia due to statin 01/03/2018   Arthritis    Hyperlipidemia associated with type 2 diabetes mellitus (HCC)    Morbid obesity (HCC) 02/14/2013   Erectile dysfunction 02/14/2013   Hypertension associated with diabetes (HCC) 11/09/2010   DM (diabetes mellitus) (HCC) 11/09/2010   REFERRING PROVIDER: Marikay Show, NP   REFERRING DIAG: Radiculopathy, lumbar region   Rationale for Evaluation and Treatment: Rehabilitation  THERAPY DIAG:  Muscle weakness (generalized)  Other low back pain  Pain in right leg  ONSET DATE: February 2025  SUBJECTIVE:                                                                                                                                                                                           SUBJECTIVE STATEMENT: Pain at a 4 today.  Left leg bothers me.    PERTINENT HISTORY:  Hypertension, diabetes, arthritis, current smoker, and previous lumbar surgery  PAIN:  Are you having pain? Yes: NPRS scale: Current: 0/10  Best: 0/10 Worst: 5/10 Pain location: low back and left leg Pain description: primarily weakness  PRECAUTIONS: None  RED FLAGS: None   WEIGHT BEARING RESTRICTIONS: No  FALLS:  Has patient fallen in last 6 months? No  LIVING ENVIRONMENT: Lives with: lives with their son Lives in: House/apartment Stairs: No Has following equipment at home: None  OCCUPATION: retired  PLOF: Independent  PATIENT GOALS: improved strength  NEXT MD VISIT: 12/15/23  OBJECTIVE:  Note: Objective  measures were completed at Evaluation unless otherwise noted.  PATIENT SURVEYS:  Modified Oswestry 40% disability   COGNITION: Overall cognitive status: Within functional limits for tasks assessed     SENSATION: Patient reports no numbness or tingling  LUMBAR ROM:   AROM eval  Flexion 60  Extension 10  Right lateral flexion 75% limited; unable to isolate lateral flexion  Left lateral flexion 75% limited; unable to isolate lateral flexion   Right rotation 50% limited  Left rotation 50% limited   (Blank rows = not tested)  LOWER EXTREMITY ROM: WFL for activities assessed  LOWER EXTREMITY MMT:    MMT Right eval Left eval  Hip flexion 4/5 4-/5  Hip extension    Hip abduction    Hip adduction    Hip internal rotation    Hip external rotation    Knee flexion 4/5 4/5  Knee extension 4/5 4/5  Ankle dorsiflexion 4-/5 4-/5  Ankle plantarflexion    Ankle inversion    Ankle eversion     (Blank rows = not tested)  FUNCTIONAL TESTS:  5 times sit to stand: 32.81 seconds with upper extremity from the armrests Sit to stand: requires upper extremity assistance from armrest, multiple attempts required   GAIT: Assistive device utilized: None Level of assistance: Complete Independence  TREATMENT DATE:   11/13/23:                                                                                                                               PATIENT EDUCATION:  Education details: See  below. Person educated: Patient Education method: Programmer, multimedia, demo Education comprehension: verbalized understanding,handout  HOME EXERCISE PROGRAM:  SKTC  [[id number]]  SINGLE KNEE TO CHEST STRETCH - SKTC -  Repeat 3 Repetitions, Hold 1 Minute, Complete 1 Set, Perform 3 Times a Day  DOUBLE KNEE TO CHEST STRETCH - DKTC -  Repeat 2 Repetitions, Hold 1 Minute, Complete 1 Set, Perform 3 Times a Day  Bridges -  Repeat 15 Repetitions, Hold 2 Seconds, Complete 2 Sets, Perform 2 Times a Day  TODAY's TX:  11/13/23:                                     EXERCISE LOG  Exercise Repetitions and Resistance Comments  Nustep Level 4 x 15 minutes   Rockerboard 5 minutes   SKTC 1 minutes each (bilateral)   DKTC 2 minutes   Hip bridges 2 x 10   Patient right sdly position with pillow between knees for comfort:  STW/M x 11 minutes to patient's left lower lumbar region.    ASSESSMENT:  CLINICAL IMPRESSION: Patient did very well with therex today and performed with excellent technique and without pain increase.  He tolerated STW/M very well and felt better after treatment.    OBJECTIVE IMPAIRMENTS: decreased activity tolerance, decreased mobility, decreased ROM, decreased strength, and pain.  ACTIVITY LIMITATIONS: lifting, stairs, and transfers  PARTICIPATION LIMITATIONS: community activity and yard work  PERSONAL FACTORS: Past/current experiences and 3+ comorbidities: Hypertension, diabetes, arthritis, current smoker, and previous lumbar surgery  are also affecting patient's functional outcome.   REHAB POTENTIAL: Good  CLINICAL DECISION MAKING: Evolving/moderate complexity  EVALUATION COMPLEXITY: Moderate   GOALS: Goals reviewed with patient? Yes  SHORT TERM GOALS: Target date: 11/28/23  Patient will be independent with his initial HEP. Baseline: Goal status: INITIAL  2.  Patient will improve his 5 times sit to stand time to 20 seconds or less for improved lower extremity  power. Baseline:  Goal status: INITIAL  LONG TERM GOALS: Target date: 12/19/23  Patient will be independent with his advanced HEP. Baseline:  Goal status: INITIAL  2.  Patient will improve his 5 times sit to stand time to 12 seconds or less to reduce his fall risk. Baseline:  Goal status: INITIAL  3.  Patient will be able to transfer from sitting to standing without upper extremity assistance for improved household independence. Baseline:  Goal status: INITIAL  4.  Patient will improve his modified Oswestry score to 30% disability or less for improved perceived function with his daily activities. Baseline:  Goal status: INITIAL  PLAN:  PT FREQUENCY: 1-2x/week  PT DURATION: 6 weeks  PLANNED INTERVENTIONS: 97164- PT Re-evaluation, 97110-Therapeutic exercises, 97530- Therapeutic activity, 97112- Neuromuscular re-education, 97535- Self Care, 16109- Manual therapy, G0283- Electrical stimulation (unattended), Patient/Family education, Balance training, Stair training, Joint mobilization, Spinal mobilization, Cryotherapy, and Moist heat.  PLAN FOR NEXT SESSION: Nustep, lumbar and lower extremity strengthening and power interventions   Mohamed Portlock, [place], PT 11/13/2023, 9:36 AM  OUTPATIENT PHYSICAL THERAPY THORACOLUMBAR EVALUATION   Patient Name: Brett Jensen MRN: [id number] DOB:10/28/1945, 78 y.o., male Today's Date: 11/13/2023  END OF SESSION:  PT End of Session - 11/13/23 0907     Visit Number 2    Number of Visits 8    Date for PT Re-Evaluation 01/26/24    PT Start Time 0845    PT Stop Time 0929    PT Time Calculation (min) 44 min    Activity Tolerance Patient tolerated treatment well    Behavior During Therapy [facility] for tasks assessed/performed             Past Medical History:  Diagnosis Date   Arthritis    bilateral knees   Diabetes mellitus without complication (HCC)    Erectile dysfunction    Hyperlipidemia    Hypertension    Restless leg syndrome     Shingles    Past Surgical History:  Procedure Laterality Date   JOINT REPLACEMENT Right    ankle   SPINE SURGERY     lower back   Patient Active Problem List   Diagnosis Date Noted   Lumbar radiculopathy 04/02/2020   Myalgia due to statin 01/03/2018   Arthritis    Hyperlipidemia associated with type 2 diabetes mellitus (HCC)    Morbid obesity (HCC) 02/14/2013   Erectile dysfunction 02/14/2013   Hypertension associated with diabetes (HCC) 11/09/2010   DM (diabetes mellitus) (HCC) 11/09/2010   REFERRING PROVIDER: Jonetta Speak, NP   REFERRING DIAG: Radiculopathy, lumbar region   Rationale for Evaluation and Treatment: Rehabilitation  THERAPY DIAG:  Muscle weakness (generalized)  Other low back pain  Pain in right leg  ONSET DATE: February 2025  SUBJECTIVE:  SUBJECTIVE STATEMENT: Patient reports that he began having low back and left leg pain about 2 months ago. His pain got so bad that he had to go to the emergency department which gave him medication to help with his pain. This seemed to help a lot. He feels that his pain has gotten better, but he feels like his left leg is really weak. He has not fallen, but he did have to use a cane for safety for a while.   PERTINENT HISTORY:  Hypertension, diabetes, arthritis, current smoker, and previous lumbar surgery  PAIN:  Are you having pain? Yes: NPRS scale: Current: 0/10 Best: 0/10 Worst: 5/10 Pain location: low back and left leg Pain description: primarily weakness  PRECAUTIONS: None  RED FLAGS: None   WEIGHT BEARING RESTRICTIONS: No  FALLS:  Has patient fallen in last 6 months? No  LIVING ENVIRONMENT: Lives with: lives with their son Lives in: House/apartment Stairs: No Has following equipment at home: None  OCCUPATION:  retired  PLOF: Independent  PATIENT GOALS: improved strength  NEXT MD VISIT: 12/15/23  OBJECTIVE:  Note: Objective measures were completed at Evaluation unless otherwise noted.  PATIENT SURVEYS:  Modified Oswestry 40% disability   COGNITION: Overall cognitive status: Within functional limits for tasks assessed     SENSATION: Patient reports no numbness or tingling  LUMBAR ROM:   AROM eval  Flexion 60  Extension 10  Right lateral flexion 75% limited; unable to isolate lateral flexion  Left lateral flexion 75% limited; unable to isolate lateral flexion   Right rotation 50% limited  Left rotation 50% limited   (Blank rows = not tested)  LOWER EXTREMITY ROM: WFL for activities assessed  LOWER EXTREMITY MMT:    MMT Right eval Left eval  Hip flexion 4/5 4-/5  Hip extension    Hip abduction    Hip adduction    Hip internal rotation    Hip external rotation    Knee flexion 4/5 4/5  Knee extension 4/5 4/5  Ankle dorsiflexion 4-/5 4-/5  Ankle plantarflexion    Ankle inversion    Ankle eversion     (Blank rows = not tested)  FUNCTIONAL TESTS:  5 times sit to stand: 32.81 seconds with upper extremity from the armrests Sit to stand: requires upper extremity assistance from armrest, multiple attempts required   GAIT: Assistive device utilized: None Level of assistance: Complete Independence  TREATMENT DATE:                                                                                                                                  PATIENT EDUCATION:  Education details: POC, healing, prognosis, objective findings, and goals for physical therapy Person educated: Patient Education method: Explanation Education comprehension: verbalized understanding  HOME EXERCISE PROGRAM:   ASSESSMENT:  CLINICAL IMPRESSION: Patient is a 78 y.o. male who was seen today for physical therapy evaluation and treatment for lumbar radiculopathy.  He presented  with low pain  severity and irritability as none of today's assessments reproduced his familiar low back pain.  However, he exhibited reduced lower extremity muscular strength and power as evidenced by his objective testing.  Recommend that he continue with skilled physical therapy to address his impairments to return to his prior level of function.  OBJECTIVE IMPAIRMENTS: decreased activity tolerance, decreased mobility, decreased ROM, decreased strength, and pain.   ACTIVITY LIMITATIONS: lifting, stairs, and transfers  PARTICIPATION LIMITATIONS: community activity and yard work  PERSONAL FACTORS: Past/current experiences and 3+ comorbidities: Hypertension, diabetes, arthritis, current smoker, and previous lumbar surgery  are also affecting patient's functional outcome.   REHAB POTENTIAL: Good  CLINICAL DECISION MAKING: Evolving/moderate complexity  EVALUATION COMPLEXITY: Moderate   GOALS: Goals reviewed with patient? Yes  SHORT TERM GOALS: Target date: 11/28/23  Patient will be independent with his initial HEP. Baseline: Goal status: INITIAL  2.  Patient will improve his 5 times sit to stand time to 20 seconds or less for improved lower extremity power. Baseline:  Goal status: INITIAL  LONG TERM GOALS: Target date: 12/19/23  Patient will be independent with his advanced HEP. Baseline:  Goal status: INITIAL  2.  Patient will improve his 5 times sit to stand time to 12 seconds or less to reduce his fall risk. Baseline:  Goal status: INITIAL  3.  Patient will be able to transfer from sitting to standing without upper extremity assistance for improved household independence. Baseline:  Goal status: INITIAL  4.  Patient will improve his modified Oswestry score to 30% disability or less for improved perceived function with his daily activities. Baseline:  Goal status: INITIAL  PLAN:  PT FREQUENCY: 1-2x/week  PT DURATION: 6 weeks  PLANNED INTERVENTIONS: 97164- PT Re-evaluation,  97110-Therapeutic exercises, 97530- Therapeutic activity, 97112- Neuromuscular re-education, 97535- Self Care, 62952- Manual therapy, G0283- Electrical stimulation (unattended), Patient/Family education, Balance training, Stair training, Joint mobilization, Spinal mobilization, Cryotherapy, and Moist heat.  PLAN FOR NEXT SESSION: Nustep, lumbar and lower extremity strengthening and power interventions   [name] Schuff, [place], PT 11/13/2023, 9:36 AM

## 2023-11-20 ENCOUNTER — Ambulatory Visit: Admitting: Physical Therapy

## 2023-11-20 DIAGNOSIS — M5459 Other low back pain: Secondary | ICD-10-CM

## 2023-11-20 DIAGNOSIS — M79604 Pain in right leg: Secondary | ICD-10-CM | POA: Diagnosis not present

## 2023-11-20 DIAGNOSIS — M6281 Muscle weakness (generalized): Secondary | ICD-10-CM | POA: Diagnosis not present

## 2023-11-20 NOTE — Therapy (Signed)
 OUTPATIENT PHYSICAL THERAPY THORACOLUMBAR TREATMENT   Patient Name: Brett Jensen MRN: [id number] DOB:29-Jan-1946, 78 y.o., male Today's Date: 11/20/2023  END OF SESSION:  PT End of Session - 11/20/23 0821     Visit Number 3    Number of Visits 8    Date for PT Re-Evaluation 01/26/24    PT Start Time 0817    PT Stop Time 0909    PT Time Calculation (min) 52 min    Activity Tolerance Patient tolerated treatment well    Behavior During Therapy [facility] for tasks assessed/performed             Past Medical History:  Diagnosis Date   Arthritis    bilateral knees   Diabetes mellitus without complication (HCC)    Erectile dysfunction    Hyperlipidemia    Hypertension    Restless leg syndrome    Shingles    Past Surgical History:  Procedure Laterality Date   JOINT REPLACEMENT Right    ankle   SPINE SURGERY     lower back   Patient Active Problem List   Diagnosis Date Noted   Lumbar radiculopathy 04/02/2020   Myalgia due to statin 01/03/2018   Arthritis    Hyperlipidemia associated with type 2 diabetes mellitus (HCC)    Morbid obesity (HCC) 02/14/2013   Erectile dysfunction 02/14/2013   Hypertension associated with diabetes (HCC) 11/09/2010   DM (diabetes mellitus) (HCC) 11/09/2010   REFERRING PROVIDER: Marikay Show, NP   REFERRING DIAG: Radiculopathy, lumbar region   Rationale for Evaluation and Treatment: Rehabilitation  THERAPY DIAG:  Muscle weakness (generalized)  Other low back pain  Pain in right leg  ONSET DATE: February 2025  SUBJECTIVE:                                                                                                                                                                                           SUBJECTIVE STATEMENT: Last treatment helped.  Pain "not too bad."  PERTINENT HISTORY:  Hypertension, diabetes, arthritis, current smoker, and previous lumbar surgery  PAIN:  Are you having pain? Yes: NPRS scale: Current: 0/10  Best: 0/10 Worst: 5/10 Pain location: low back and left leg Pain description: primarily weakness  PRECAUTIONS: None  RED FLAGS: None   WEIGHT BEARING RESTRICTIONS: No  FALLS:  Has patient fallen in last 6 months? No  LIVING ENVIRONMENT: Lives with: lives with their son Lives in: House/apartment Stairs: No Has following equipment at home: None  OCCUPATION: retired  PLOF: Independent  PATIENT GOALS: improved strength  NEXT MD VISIT: 12/15/23  OBJECTIVE:  Note: Objective measures were completed at Evaluation  unless otherwise noted.  PATIENT SURVEYS:  Modified Oswestry 40% disability   COGNITION: Overall cognitive status: Within functional limits for tasks assessed     SENSATION: Patient reports no numbness or tingling  LUMBAR ROM:   AROM eval  Flexion 60  Extension 10  Right lateral flexion 75% limited; unable to isolate lateral flexion  Left lateral flexion 75% limited; unable to isolate lateral flexion   Right rotation 50% limited  Left rotation 50% limited   (Blank rows = not tested)  LOWER EXTREMITY ROM: WFL for activities assessed  LOWER EXTREMITY MMT:    MMT Right eval Left eval  Hip flexion 4/5 4-/5  Hip extension    Hip abduction    Hip adduction    Hip internal rotation    Hip external rotation    Knee flexion 4/5 4/5  Knee extension 4/5 4/5  Ankle dorsiflexion 4-/5 4-/5  Ankle plantarflexion    Ankle inversion    Ankle eversion     (Blank rows = not tested)  FUNCTIONAL TESTS:  5 times sit to stand: 32.81 seconds with upper extremity from the armrests Sit to stand: requires upper extremity assistance from armrest, multiple attempts required   GAIT: Assistive device utilized: None Level of assistance: Complete Independence  TREATMENT DATE:      11/20/23:  Nustep level 3 x 15 minutes                                                                                                                            PATIENT EDUCATION:   Education details: POC, healing, prognosis, objective findings, and goals for physical therapy Person educated: Patient Education method: Explanation Education comprehension: verbalized understanding  HOME EXERCISE PROGRAM:  TODAY's TX:  11/20/23:  Nustep level 3 x 15 minutes f/b STW/M x 8 minutes to patient's left low back with patient in right sdly position with pillow between knees for comfort f/b HMP and IFC at 80-150 Hz on 40% scan x 20 minutes to patient's low back.   Normal modality response following removal of modality.    ASSESSMENT:  CLINICAL IMPRESSION:  Patient did very well with treatment today.  He presented to the clinic today with low pain.  He states he has been dancing a couple times a week.  He had no complaints following treatment.     OBJECTIVE IMPAIRMENTS: decreased activity tolerance, decreased mobility, decreased ROM, decreased strength, and pain.   ACTIVITY LIMITATIONS: lifting, stairs, and transfers  PARTICIPATION LIMITATIONS: community activity and yard work  PERSONAL FACTORS: Past/current experiences and 3+ comorbidities: Hypertension, diabetes, arthritis, current smoker, and previous lumbar surgery  are also affecting patient's functional outcome.   REHAB POTENTIAL: Good  CLINICAL DECISION MAKING: Evolving/moderate complexity  EVALUATION COMPLEXITY: Moderate   GOALS: Goals reviewed with patient? Yes  SHORT TERM GOALS: Target date: 11/28/23  Patient will be independent with his initial HEP. Baseline: Goal status: INITIAL  2.  Patient will improve his 5 times sit  to stand time to 20 seconds or less for improved lower extremity power. Baseline:  Goal status: INITIAL  LONG TERM GOALS: Target date: 12/19/23  Patient will be independent with his advanced HEP. Baseline:  Goal status: INITIAL  2.  Patient will improve his 5 times sit to stand time to 12 seconds or less to reduce his fall risk. Baseline:  Goal status: INITIAL  3.  Patient  will be able to transfer from sitting to standing without upper extremity assistance for improved household independence. Baseline:  Goal status: INITIAL  4.  Patient will improve his modified Oswestry score to 30% disability or less for improved perceived function with his daily activities. Baseline:  Goal status: INITIAL  PLAN:  PT FREQUENCY: 1-2x/week  PT DURATION: 6 weeks  PLANNED INTERVENTIONS: 97164- PT Re-evaluation, 97110-Therapeutic exercises, 97530- Therapeutic activity, 97112- Neuromuscular re-education, 97535- Self Care, 69629- Manual therapy, G0283- Electrical stimulation (unattended), Patient/Family education, Balance training, Stair training, Joint mobilization, Spinal mobilization, Cryotherapy, and Moist heat.  PLAN FOR NEXT SESSION: Nustep, lumbar and lower extremity strengthening and power interventions   Asaf Elmquist, [place], PT 11/20/2023, 9:19 AM

## 2023-11-22 ENCOUNTER — Ambulatory Visit: Admitting: Physical Therapy

## 2023-11-22 DIAGNOSIS — M79604 Pain in right leg: Secondary | ICD-10-CM | POA: Diagnosis not present

## 2023-11-22 DIAGNOSIS — M6281 Muscle weakness (generalized): Secondary | ICD-10-CM

## 2023-11-22 DIAGNOSIS — M5459 Other low back pain: Secondary | ICD-10-CM

## 2023-11-22 NOTE — Therapy (Signed)
 OUTPATIENT PHYSICAL THERAPY THORACOLUMBAR TREATMENT   Patient Name: Brett Jensen MRN: [id number] DOB:11/24/1945, 78 y.o., male Today's Date: 11/22/2023  END OF SESSION:  PT End of Session - 11/22/23 0901     Visit Number 4    Number of Visits 8    Date for PT Re-Evaluation 01/26/24    PT Start Time 0845    PT Stop Time 0935    PT Time Calculation (min) 50 min    Activity Tolerance Patient tolerated treatment well    Behavior During Therapy [facility] for tasks assessed/performed             Past Medical History:  Diagnosis Date   Arthritis    bilateral knees   Diabetes mellitus without complication (HCC)    Erectile dysfunction    Hyperlipidemia    Hypertension    Restless leg syndrome    Shingles    Past Surgical History:  Procedure Laterality Date   JOINT REPLACEMENT Right    ankle   SPINE SURGERY     lower back   Patient Active Problem List   Diagnosis Date Noted   Lumbar radiculopathy 04/02/2020   Myalgia due to statin 01/03/2018   Arthritis    Hyperlipidemia associated with type 2 diabetes mellitus (HCC)    Morbid obesity (HCC) 02/14/2013   Erectile dysfunction 02/14/2013   Hypertension associated with diabetes (HCC) 11/09/2010   DM (diabetes mellitus) (HCC) 11/09/2010   REFERRING PROVIDER: Marikay Show, NP   REFERRING DIAG: Radiculopathy, lumbar region   Rationale for Evaluation and Treatment: Rehabilitation  THERAPY DIAG:  Muscle weakness (generalized)  Other low back pain  Pain in right leg  ONSET DATE: February 2025  SUBJECTIVE:                                                                                                                                                                                           SUBJECTIVE STATEMENT: Doing okay. PERTINENT HISTORY:  Hypertension, diabetes, arthritis, current smoker, and previous lumbar surgery  PAIN:  Are you having pain? Yes: NPRS scale: Current: 0/10 Best: 0/10 Worst: 5/10 Pain  location: low back and left leg Pain description: primarily weakness  PRECAUTIONS: None  RED FLAGS: None   WEIGHT BEARING RESTRICTIONS: No  FALLS:  Has patient fallen in last 6 months? No  LIVING ENVIRONMENT: Lives with: lives with their son Lives in: House/apartment Stairs: No Has following equipment at home: None  OCCUPATION: retired  PLOF: Independent  PATIENT GOALS: improved strength  NEXT MD VISIT: 12/15/23  OBJECTIVE:  Note: Objective measures were completed at Evaluation unless otherwise noted.  PATIENT SURVEYS:  Modified Oswestry 40% disability   COGNITION: Overall cognitive status: Within functional limits for tasks assessed     SENSATION: Patient reports no numbness or tingling  LUMBAR ROM:   AROM eval  Flexion 60  Extension 10  Right lateral flexion 75% limited; unable to isolate lateral flexion  Left lateral flexion 75% limited; unable to isolate lateral flexion   Right rotation 50% limited  Left rotation 50% limited   (Blank rows = not tested)  LOWER EXTREMITY ROM: WFL for activities assessed  LOWER EXTREMITY MMT:    MMT Right eval Left eval  Hip flexion 4/5 4-/5  Hip extension    Hip abduction    Hip adduction    Hip internal rotation    Hip external rotation    Knee flexion 4/5 4/5  Knee extension 4/5 4/5  Ankle dorsiflexion 4-/5 4-/5  Ankle plantarflexion    Ankle inversion    Ankle eversion     (Blank rows = not tested)  FUNCTIONAL TESTS:  5 times sit to stand: 32.81 seconds with upper extremity from the armrests Sit to stand: requires upper extremity assistance from armrest, multiple attempts required   GAIT: Assistive device utilized: None Level of assistance: Complete Independence  TREATMENT DATE:      11/22/23:  Nustep level 3 x 15 minutes  f/b knee ext at 10# x 3 minutes f/b ham curls at 30# x 3 minutes f/b leg press at 2 plates x 3 minutes f/b  HMP and IFC at 80-150 Hz on 40% scan x 20 minutes to patient's low back.   Normal modality response following removal of modality.                          PATIENT EDUCATION:  Education details: POC, healing, prognosis, objective findings, and goals for physical therapy Person educated: Patient Education method: Explanation Education comprehension: verbalized understanding  HOME EXERCISE PROGRAM:  TODAY's TX:  11/20/23:  Nustep level 3 x 15 minutes f/b STW/M x 8 minutes to patient's left low back with patient in right sdly position with pillow between knees for comfort f/b HMP and IFC at 80-150 Hz on 40% scan x 20 minutes to patient's low back.   Normal modality response following removal of modality.    ASSESSMENT:  CLINICAL IMPRESSION:  Patient did very well with the addition of weight machines today.  He performed with excellent technique and no complaints.   OBJECTIVE IMPAIRMENTS: decreased activity tolerance, decreased mobility, decreased ROM, decreased strength, and pain.   ACTIVITY LIMITATIONS: lifting, stairs, and transfers  PARTICIPATION LIMITATIONS: community activity and yard work  PERSONAL FACTORS: Past/current experiences and 3+ comorbidities: Hypertension, diabetes, arthritis, current smoker, and previous lumbar surgery  are also affecting patient's functional outcome.   REHAB POTENTIAL: Good  CLINICAL DECISION MAKING: Evolving/moderate complexity  EVALUATION COMPLEXITY: Moderate   GOALS: Goals reviewed with patient? Yes  SHORT TERM GOALS: Target date: 11/28/23  Patient will be independent with his initial HEP. Baseline: Goal status: INITIAL  2.  Patient will improve his 5 times sit to stand time to 20 seconds or less for improved lower extremity power. Baseline:  Goal status: INITIAL  LONG TERM GOALS: Target date: 12/19/23  Patient will be independent with his advanced HEP. Baseline:  Goal status: INITIAL  2.  Patient will improve his 5 times sit to stand time to 12 seconds or less to reduce his fall risk. Baseline:  Goal  status: INITIAL  3.  Patient will be able to transfer from sitting to standing without upper extremity assistance for improved household independence. Baseline:  Goal status: INITIAL  4.  Patient will improve his modified Oswestry score to 30% disability or less for improved perceived function with his daily activities. Baseline:  Goal status: INITIAL  PLAN:  PT FREQUENCY: 1-2x/week  PT DURATION: 6 weeks  PLANNED INTERVENTIONS: 97164- PT Re-evaluation, 97110-Therapeutic exercises, 97530- Therapeutic activity, 97112- Neuromuscular re-education, 97535- Self Care, 32440- Manual therapy, G0283- Electrical stimulation (unattended), Patient/Family education, Balance training, Stair training, Joint mobilization, Spinal mobilization, Cryotherapy, and Moist heat.  PLAN FOR NEXT SESSION: Nustep, lumbar and lower extremity strengthening and power interventions   Ali Mohl, [place], PT 11/22/2023, 9:44 AM

## 2023-11-27 ENCOUNTER — Ambulatory Visit

## 2023-11-27 DIAGNOSIS — M5459 Other low back pain: Secondary | ICD-10-CM | POA: Diagnosis not present

## 2023-11-27 DIAGNOSIS — M79604 Pain in right leg: Secondary | ICD-10-CM

## 2023-11-27 DIAGNOSIS — M6281 Muscle weakness (generalized): Secondary | ICD-10-CM | POA: Diagnosis not present

## 2023-11-27 NOTE — Therapy (Signed)
 OUTPATIENT PHYSICAL THERAPY THORACOLUMBAR TREATMENT   Patient Name: Brett Jensen MRN: [id number] DOB:05/10/1946, 78 y.o., male Today's Date: 11/27/2023  END OF SESSION:  PT End of Session - 11/27/23 0848     Visit Number 5    Number of Visits 8    Date for PT Re-Evaluation 01/26/24    PT Start Time 0845    PT Stop Time 0940    PT Time Calculation (min) 55 min    Activity Tolerance Patient tolerated treatment well    Behavior During Therapy [facility] for tasks assessed/performed             Past Medical History:  Diagnosis Date   Arthritis    bilateral knees   Diabetes mellitus without complication (HCC)    Erectile dysfunction    Hyperlipidemia    Hypertension    Restless leg syndrome    Shingles    Past Surgical History:  Procedure Laterality Date   JOINT REPLACEMENT Right    ankle   SPINE SURGERY     lower back   Patient Active Problem List   Diagnosis Date Noted   Lumbar radiculopathy 04/02/2020   Myalgia due to statin 01/03/2018   Arthritis    Hyperlipidemia associated with type 2 diabetes mellitus (HCC)    Morbid obesity (HCC) 02/14/2013   Erectile dysfunction 02/14/2013   Hypertension associated with diabetes (HCC) 11/09/2010   DM (diabetes mellitus) (HCC) 11/09/2010   REFERRING PROVIDER: Marikay Show, NP   REFERRING DIAG: Radiculopathy, lumbar region   Rationale for Evaluation and Treatment: Rehabilitation  THERAPY DIAG:  Muscle weakness (generalized)  Other low back pain  Pain in right leg  ONSET DATE: February 2025  SUBJECTIVE:                                                                                                                                                                                           SUBJECTIVE STATEMENT: Pt reports 2/10 right leg pain today.   PERTINENT HISTORY:  Hypertension, diabetes, arthritis, current smoker, and previous lumbar surgery  PAIN:  Are you having pain? Yes: NPRS scale: 2/10 Pain location:  right leg Pain description: primarily weakness  PRECAUTIONS: None  RED FLAGS: None   WEIGHT BEARING RESTRICTIONS: No  FALLS:  Has patient fallen in last 6 months? No  LIVING ENVIRONMENT: Lives with: lives with their son Lives in: House/apartment Stairs: No Has following equipment at home: None  OCCUPATION: retired  PLOF: Independent  PATIENT GOALS: improved strength  NEXT MD VISIT: 12/15/23  OBJECTIVE:  Note: Objective measures were completed at Evaluation unless otherwise noted.  PATIENT SURVEYS:  Modified  Oswestry 40% disability   COGNITION: Overall cognitive status: Within functional limits for tasks assessed     SENSATION: Patient reports no numbness or tingling  LUMBAR ROM:   AROM eval  Flexion 60  Extension 10  Right lateral flexion 75% limited; unable to isolate lateral flexion  Left lateral flexion 75% limited; unable to isolate lateral flexion   Right rotation 50% limited  Left rotation 50% limited   (Blank rows = not tested)  LOWER EXTREMITY ROM: WFL for activities assessed  LOWER EXTREMITY MMT:    MMT Right eval Left eval  Hip flexion 4/5 4-/5  Hip extension    Hip abduction    Hip adduction    Hip internal rotation    Hip external rotation    Knee flexion 4/5 4/5  Knee extension 4/5 4/5  Ankle dorsiflexion 4-/5 4-/5  Ankle plantarflexion    Ankle inversion    Ankle eversion     (Blank rows = not tested)  FUNCTIONAL TESTS:  5 times sit to stand: 32.81 seconds with upper extremity from the armrests Sit to stand: requires upper extremity assistance from armrest, multiple attempts required   GAIT: Assistive device utilized: None Level of assistance: Complete Independence  TREATMENT DATE:         11/27/23                                  EXERCISE LOG  Exercise Repetitions and Resistance Comments  Nustep  Lvl 4 x 15 mins   Cybex Knee Flexion 50# x 3.5 mins   Cybex Knee Extension 20# x 3.5 mins   Rockerboard 3 mins   Lunges  14" box x 3 mins   Standing Hip Abduction 25 reps bil   Standing Marches 25 reps bil    Blank cell = exercise not performed today   Modalities  Date:  Unattended Estim: Lumbar, IFC 80-150 Hz, 15 mins, Pain Hot Pack: Lumbar, 15 mins, Pain   11/22/23:  Nustep level 3 x 15 minutes  f/b knee ext at 10# x 3 minutes f/b ham curls at 30# x 3 minutes f/b leg press at 2 plates x 3 minutes f/b  HMP and IFC at 80-150 Hz on 40% scan x 20 minutes to patient's low back.  Normal modality response following removal of modality.  PATIENT EDUCATION:   Education details: POC, healing, prognosis, objective findings, and goals for physical therapy Person educated: Patient Education method: Explanation Education comprehension: verbalized understanding  HOME EXERCISE PROGRAM:    ASSESSMENT:  CLINICAL IMPRESSION:  Pt arrives for today's treatment session reporting 2/10 right leg pain.  Pt able to tolerate introduction to several standing exercises today with min cues given for proper technique and pacing.  Pt able to tolerate increased weight and time with all cybex exercises today.  Normal responses to estim and Mh noted upon removal.  Pt reported minimal decrease in pain at completion of today's treatment session.    OBJECTIVE IMPAIRMENTS: decreased activity tolerance, decreased mobility, decreased ROM, decreased strength, and pain.   ACTIVITY LIMITATIONS: lifting, stairs, and transfers  PARTICIPATION LIMITATIONS: community activity and yard work  PERSONAL FACTORS: Past/current experiences and 3+ comorbidities: Hypertension, diabetes, arthritis, current smoker, and previous lumbar surgery  are also affecting patient's functional outcome.   REHAB POTENTIAL: Good  CLINICAL DECISION MAKING: Evolving/moderate complexity  EVALUATION COMPLEXITY: Moderate   GOALS: Goals reviewed with patient? Yes  SHORT TERM GOALS:  Target date: 11/28/23  Patient will be independent with his initial  HEP. Baseline: Goal status: INITIAL  2.  Patient will improve his 5 times sit to stand time to 20 seconds or less for improved lower extremity power. Baseline:  Goal status: INITIAL  LONG TERM GOALS: Target date: 12/19/23  Patient will be independent with his advanced HEP. Baseline:  Goal status: INITIAL  2.  Patient will improve his 5 times sit to stand time to 12 seconds or less to reduce his fall risk. Baseline:  Goal status: INITIAL  3.  Patient will be able to transfer from sitting to standing without upper extremity assistance for improved household independence. Baseline:  Goal status: INITIAL  4.  Patient will improve his modified Oswestry score to 30% disability or less for improved perceived function with his daily activities. Baseline:  Goal status: INITIAL  PLAN:  PT FREQUENCY: 1-2x/week  PT DURATION: 6 weeks  PLANNED INTERVENTIONS: 97164- PT Re-evaluation, 97110-Therapeutic exercises, 97530- Therapeutic activity, 97112- Neuromuscular re-education, 97535- Self Care, 30865- Manual therapy, G0283- Electrical stimulation (unattended), Patient/Family education, Balance training, Stair training, Joint mobilization, Spinal mobilization, Cryotherapy, and Moist heat.  PLAN FOR NEXT SESSION: Nustep, lumbar and lower extremity strengthening and power interventions   Deryl Flora, PTA 11/27/2023, 9:51 AM

## 2023-11-29 ENCOUNTER — Ambulatory Visit: Admitting: Physical Therapy

## 2023-11-29 ENCOUNTER — Encounter: Payer: Self-pay | Admitting: Physical Therapy

## 2023-11-29 DIAGNOSIS — M6281 Muscle weakness (generalized): Secondary | ICD-10-CM | POA: Diagnosis not present

## 2023-11-29 DIAGNOSIS — M5459 Other low back pain: Secondary | ICD-10-CM | POA: Diagnosis not present

## 2023-11-29 DIAGNOSIS — M79604 Pain in right leg: Secondary | ICD-10-CM | POA: Diagnosis not present

## 2023-11-29 NOTE — Therapy (Signed)
 OUTPATIENT PHYSICAL THERAPY THORACOLUMBAR TREATMENT   Patient Name: Brett Jensen MRN: [id number] DOB:August 27, 1945, 78 y.o., male Today's Date: 11/29/2023  END OF SESSION:  PT End of Session - 11/29/23 0924     Visit Number 7    Number of Visits 8    Date for PT Re-Evaluation 01/26/24    PT Start Time 0845    Activity Tolerance Patient tolerated treatment well    Behavior During Therapy [facility] for tasks assessed/performed             Past Medical History:  Diagnosis Date   Arthritis    bilateral knees   Diabetes mellitus without complication (HCC)    Erectile dysfunction    Hyperlipidemia    Hypertension    Restless leg syndrome    Shingles    Past Surgical History:  Procedure Laterality Date   JOINT REPLACEMENT Right    ankle   SPINE SURGERY     lower back   Patient Active Problem List   Diagnosis Date Noted   Lumbar radiculopathy 04/02/2020   Myalgia due to statin 01/03/2018   Arthritis    Hyperlipidemia associated with type 2 diabetes mellitus (HCC)    Morbid obesity (HCC) 02/14/2013   Erectile dysfunction 02/14/2013   Hypertension associated with diabetes (HCC) 11/09/2010   DM (diabetes mellitus) (HCC) 11/09/2010   REFERRING PROVIDER: Marikay Show, NP   REFERRING DIAG: Radiculopathy, lumbar region   Rationale for Evaluation and Treatment: Rehabilitation  THERAPY DIAG:  Muscle weakness (generalized)  Other low back pain  Pain in right leg  ONSET DATE: February 2025  SUBJECTIVE:                                                                                                                                                                                           SUBJECTIVE STATEMENT: No new complaints.  PERTINENT HISTORY:  Hypertension, diabetes, arthritis, current smoker, and previous lumbar surgery  PAIN:  Are you having pain? Yes: NPRS scale: 2/10 Pain location: right leg Pain description: primarily weakness  PRECAUTIONS: None  RED  FLAGS: None   WEIGHT BEARING RESTRICTIONS: No  FALLS:  Has patient fallen in last 6 months? No  LIVING ENVIRONMENT: Lives with: lives with their son Lives in: House/apartment Stairs: No Has following equipment at home: None  OCCUPATION: retired  PLOF: Independent  PATIENT GOALS: improved strength  NEXT MD VISIT: 12/15/23  OBJECTIVE:  Note: Objective measures were completed at Evaluation unless otherwise noted.  PATIENT SURVEYS:  Modified Oswestry 40% disability   COGNITION: Overall cognitive status: Within functional limits for tasks assessed     SENSATION: Patient  reports no numbness or tingling  LUMBAR ROM:   AROM eval  Flexion 60  Extension 10  Right lateral flexion 75% limited; unable to isolate lateral flexion  Left lateral flexion 75% limited; unable to isolate lateral flexion   Right rotation 50% limited  Left rotation 50% limited   (Blank rows = not tested)  LOWER EXTREMITY ROM: WFL for activities assessed  LOWER EXTREMITY MMT:    MMT Right eval Left eval  Hip flexion 4/5 4-/5  Hip extension    Hip abduction    Hip adduction    Hip internal rotation    Hip external rotation    Knee flexion 4/5 4/5  Knee extension 4/5 4/5  Ankle dorsiflexion 4-/5 4-/5  Ankle plantarflexion    Ankle inversion    Ankle eversion     (Blank rows = not tested)  FUNCTIONAL TESTS:  5 times sit to stand: 32.81 seconds with upper extremity from the armrests Sit to stand: requires upper extremity assistance from armrest, multiple attempts required   GAIT: Assistive device utilized: None Level of assistance: Complete Independence  TREATMENT DATE:       11/29/23                                     EXERCISE LOG  Exercise Repetitions and Resistance Comments  Nustep Level 4 x 17 minutes   Back ext machine 60# with draw-in activation x 4 minutes   Ab curls  60# with draw-in activation x 4 minutes            HMP and IFC at 80-150 Hz on 40% scan x 20 minutes  to patient's low back.  Normal modality response following removal of modality.    11/27/23                                  EXERCISE LOG  Exercise Repetitions and Resistance Comments  Nustep  Lvl 4 x 15 mins   Cybex Knee Flexion 50# x 3.5 mins   Cybex Knee Extension 20# x 3.5 mins   Rockerboard 3 mins   Lunges 14" box x 3 mins   Standing Hip Abduction 25 reps bil   Standing Marches 25 reps bil    Blank cell = exercise not performed today   Modalities  Date:  Unattended Estim: Lumbar, IFC 80-150 Hz, 15 mins, Pain Hot Pack: Lumbar, 15 mins, Pain   11/22/23:  Nustep level 3 x 15 minutes  f/b knee ext at 10# x 3 minutes f/b ham curls at 30# x 3 minutes f/b leg press at 2 plates x 3 minutes f/b  HMP and IFC at 80-150 Hz on 40% scan x 20 minutes to patient's low back.  Normal modality response following removal of modality.  PATIENT EDUCATION:   Education details: POC, healing, prognosis, objective findings, and goals for physical therapy Person educated: Patient Education method: Explanation Education comprehension: verbalized understanding  HOME EXERCISE PROGRAM:    ASSESSMENT:  CLINICAL IMPRESSION:  Patient did very well with the addition of the back and ab machine performing with excellent technique and no pain increase.  OBJECTIVE IMPAIRMENTS: decreased activity tolerance, decreased mobility, decreased ROM, decreased strength, and pain.   ACTIVITY LIMITATIONS: lifting, stairs, and transfers  PARTICIPATION LIMITATIONS: community activity and yard work  PERSONAL FACTORS: Past/current experiences and 3+ comorbidities: Hypertension,  diabetes, arthritis, current smoker, and previous lumbar surgery  are also affecting patient's functional outcome.   REHAB POTENTIAL: Good  CLINICAL DECISION MAKING: Evolving/moderate complexity  EVALUATION COMPLEXITY: Moderate   GOALS: Goals reviewed with patient? Yes  SHORT TERM GOALS: Target date: 11/28/23  Patient will be  independent with his initial HEP. Baseline: Goal status: INITIAL  2.  Patient will improve his 5 times sit to stand time to 20 seconds or less for improved lower extremity power. Baseline:  Goal status: INITIAL  LONG TERM GOALS: Target date: 12/19/23  Patient will be independent with his advanced HEP. Baseline:  Goal status: INITIAL  2.  Patient will improve his 5 times sit to stand time to 12 seconds or less to reduce his fall risk. Baseline:  Goal status: INITIAL  3.  Patient will be able to transfer from sitting to standing without upper extremity assistance for improved household independence. Baseline:  Goal status: INITIAL  4.  Patient will improve his modified Oswestry score to 30% disability or less for improved perceived function with his daily activities. Baseline:  Goal status: INITIAL  PLAN:  PT FREQUENCY: 1-2x/week  PT DURATION: 6 weeks  PLANNED INTERVENTIONS: 97164- PT Re-evaluation, 97110-Therapeutic exercises, 97530- Therapeutic activity, 97112- Neuromuscular re-education, 97535- Self Care, 16109- Manual therapy, G0283- Electrical stimulation (unattended), Patient/Family education, Balance training, Stair training, Joint mobilization, Spinal mobilization, Cryotherapy, and Moist heat.  PLAN FOR NEXT SESSION: Nustep, lumbar and lower extremity strengthening and power interventions   Refugio Vandevoorde, [place], PT 11/29/2023, 10:28 AM

## 2023-12-04 ENCOUNTER — Ambulatory Visit: Attending: Nurse Practitioner

## 2023-12-04 DIAGNOSIS — M79604 Pain in right leg: Secondary | ICD-10-CM | POA: Diagnosis not present

## 2023-12-04 DIAGNOSIS — M6281 Muscle weakness (generalized): Secondary | ICD-10-CM | POA: Diagnosis not present

## 2023-12-04 DIAGNOSIS — M5459 Other low back pain: Secondary | ICD-10-CM | POA: Insufficient documentation

## 2023-12-04 NOTE — Therapy (Signed)
 OUTPATIENT PHYSICAL THERAPY THORACOLUMBAR TREATMENT   Patient Name: Brett Jensen MRN: [id number] DOB:[birth year], 78 y.o., male Today's Date: 12/04/2023  END OF SESSION:  PT End of Session - 12/04/23 0829     Visit Number 7    Number of Visits 8    Date for PT Re-Evaluation 01/26/24    PT Start Time 0826    PT Stop Time 0921    PT Time Calculation (min) 55 min    Activity Tolerance Patient tolerated treatment well    Behavior During Therapy [facility] for tasks assessed/performed              Past Medical History:  Diagnosis Date   Arthritis    bilateral knees   Diabetes mellitus without complication (HCC)    Erectile dysfunction    Hyperlipidemia    Hypertension    Restless leg syndrome    Shingles    Past Surgical History:  Procedure Laterality Date   JOINT REPLACEMENT Right    ankle   SPINE SURGERY     lower back   Patient Active Problem List   Diagnosis Date Noted   Lumbar radiculopathy 04/02/2020   Myalgia due to statin 01/03/2018   Arthritis    Hyperlipidemia associated with type 2 diabetes mellitus (HCC)    Morbid obesity (HCC) 02/14/2013   Erectile dysfunction 02/14/2013   Hypertension associated with diabetes (HCC) 11/09/2010   DM (diabetes mellitus) (HCC) 11/09/2010   REFERRING PROVIDER: Marikay Show, NP   REFERRING DIAG: Radiculopathy, lumbar region   Rationale for Evaluation and Treatment: Rehabilitation  THERAPY DIAG:  Muscle weakness (generalized)  Other low back pain  ONSET DATE: February 2025  SUBJECTIVE:                                                                                                                                                                                           SUBJECTIVE STATEMENT: Patient notes that his back feels alright, but his legs still feel weak. He feels that he is about 80% back to his prior level of function.   PERTINENT HISTORY:  Hypertension, diabetes, arthritis, current smoker, and previous  lumbar surgery  PAIN:  Are you having pain? Yes: NPRS scale: no pain score provided Pain location: right leg Pain description: primarily weakness  PRECAUTIONS: None  RED FLAGS: None   WEIGHT BEARING RESTRICTIONS: No  FALLS:  Has patient fallen in last 6 months? No  LIVING ENVIRONMENT: Lives with: lives with their son Lives in: House/apartment Stairs: No Has following equipment at home: None  OCCUPATION: retired  PLOF: Independent  PATIENT GOALS: improved strength  NEXT MD VISIT:  12/15/23  OBJECTIVE:  Note: Objective measures were completed at Evaluation unless otherwise noted.  PATIENT SURVEYS:  Modified Oswestry 40% disability   COGNITION: Overall cognitive status: Within functional limits for tasks assessed     SENSATION: Patient reports no numbness or tingling  LUMBAR ROM:   AROM eval  Flexion 60  Extension 10  Right lateral flexion 75% limited; unable to isolate lateral flexion  Left lateral flexion 75% limited; unable to isolate lateral flexion   Right rotation 50% limited  Left rotation 50% limited   (Blank rows = not tested)  LOWER EXTREMITY ROM: WFL for activities assessed  LOWER EXTREMITY MMT:    MMT Right eval Left eval  Hip flexion 4/5 4-/5  Hip extension    Hip abduction    Hip adduction    Hip internal rotation    Hip external rotation    Knee flexion 4/5 4/5  Knee extension 4/5 4/5  Ankle dorsiflexion 4-/5 4-/5  Ankle plantarflexion    Ankle inversion    Ankle eversion     (Blank rows = not tested)  FUNCTIONAL TESTS:  5 times sit to stand: 32.81 seconds with upper extremity from the armrests Sit to stand: requires upper extremity assistance from armrest, multiple attempts required   GAIT: Assistive device utilized: None Level of assistance: Complete Independence  TREATMENT DATE:                                        12/04/23 EXERCISE LOG  Exercise Repetitions and Resistance Comments  Nustep  L6 x 18 minutes   Cybex  knee flexion  60# x 2.5 minutes   Cybex knee extension  20# x 2 minutes    Heel raise  2 minutes  From inclined wedge  Standing hip ABD 2 minutes  Alternating LE  Standing hip extension  2 minutes  Alternating LE  Sit to stand  25 reps  Without UE support  Seated hip ADD isometric  3 minutes w/ 5 second hold   Step up onto BOSU Ball up x 3 minutes   Rocker board  4.5 minutes   Tandem walking on foam  10 laps  Forward and backward; BUE support on foam        Blank cell = exercise not performed today   11/29/23 EXERCISE LOG  Exercise Repetitions and Resistance Comments  Nustep Level 4 x 17 minutes   Back ext machine 60# with draw-in activation x 4 minutes   Ab curls  60# with draw-in activation x 4 minutes            HMP and IFC at 80-150 Hz on 40% scan x 20 minutes to patient's low back.  Normal modality response following removal of modality.    11/27/23                                  EXERCISE LOG  Exercise Repetitions and Resistance Comments  Nustep  Lvl 4 x 15 mins   Cybex Knee Flexion 50# x 3.5 mins   Cybex Knee Extension 20# x 3.5 mins   Rockerboard 3 mins   Lunges 14" box x 3 mins   Standing Hip Abduction 25 reps bil   Standing Marches 25 reps bil    Blank cell = exercise not performed today   Modalities  Date:  Unattended Estim: Lumbar, IFC 80-150 Hz, 15 mins, Pain Hot Pack: Lumbar, 15 mins, Pain   PATIENT EDUCATION:   Education details: POC  Person educated: Patient Education method: Explanation Education comprehension: verbalized understanding  HOME EXERCISE PROGRAM:    ASSESSMENT:  CLINICAL IMPRESSION:  Patient was progressed with multiple new interventions for improved muscular strength and endurance needed to improve his functional mobility. He required minimal cueing with today's weighted interventions for improved eccentric control. He experienced a mild increase in fatigue with today's interventions, but this did not limit his ability to complete  any of today's interventions. He reported feeling good upon the conclusion of treatment. He continues to require skilled physical therapy to address his remaining impairments to return to his prior level of function.   OBJECTIVE IMPAIRMENTS: decreased activity tolerance, decreased mobility, decreased ROM, decreased strength, and pain.   ACTIVITY LIMITATIONS: lifting, stairs, and transfers  PARTICIPATION LIMITATIONS: community activity and yard work  PERSONAL FACTORS: Past/current experiences and 3+ comorbidities: Hypertension, diabetes, arthritis, current smoker, and previous lumbar surgery  are also affecting patient's functional outcome.   REHAB POTENTIAL: Good  CLINICAL DECISION MAKING: Evolving/moderate complexity  EVALUATION COMPLEXITY: Moderate   GOALS: Goals reviewed with patient? Yes  SHORT TERM GOALS: Target date: 11/28/23  Patient will be independent with his initial HEP. Baseline: Goal status: MET  2.  Patient will improve his 5 times sit to stand time to 20 seconds or less for improved lower extremity power. Baseline:  Goal status: INITIAL  LONG TERM GOALS: Target date: 12/19/23  Patient will be independent with his advanced HEP. Baseline:  Goal status: MET  2.  Patient will improve his 5 times sit to stand time to 12 seconds or less to reduce his fall risk. Baseline:  Goal status: INITIAL  3.  Patient will be able to transfer from sitting to standing without upper extremity assistance for improved household independence. Baseline:  Goal status: INITIAL  4.  Patient will improve his modified Oswestry score to 30% disability or less for improved perceived function with his daily activities. Baseline:  Goal status: INITIAL  PLAN:  PT FREQUENCY: 1-2x/week  PT DURATION: 6 weeks  PLANNED INTERVENTIONS: 97164- PT Re-evaluation, 97110-Therapeutic exercises, 97530- Therapeutic activity, 97112- Neuromuscular re-education, 97535- Self Care, 65784- Manual therapy,  G0283- Electrical stimulation (unattended), Patient/Family education, Balance training, Stair training, Joint mobilization, Spinal mobilization, Cryotherapy, and Moist heat.  PLAN FOR NEXT SESSION: Nustep, lumbar and lower extremity strengthening and power interventions   Lane Pinon, PT 12/04/2023, 9:25 AM

## 2023-12-06 ENCOUNTER — Ambulatory Visit: Admitting: Physical Therapy

## 2023-12-06 DIAGNOSIS — M79604 Pain in right leg: Secondary | ICD-10-CM | POA: Diagnosis not present

## 2023-12-06 DIAGNOSIS — M6281 Muscle weakness (generalized): Secondary | ICD-10-CM | POA: Diagnosis not present

## 2023-12-06 DIAGNOSIS — M5459 Other low back pain: Secondary | ICD-10-CM

## 2023-12-06 NOTE — Therapy (Signed)
 OUTPATIENT PHYSICAL THERAPY THORACOLUMBAR TREATMENT   Patient Name: Brett Jensen MRN: [id number] DOB:[birth year], 78 y.o., male Today's Date: 12/06/2023  END OF SESSION:  PT End of Session - 12/06/23 0900     Visit Number 8    Number of Visits 8    Date for PT Re-Evaluation 01/26/24    PT Start Time 0845    PT Stop Time 0930    PT Time Calculation (min) 45 min    Activity Tolerance Patient tolerated treatment well    Behavior During Therapy [facility] for tasks assessed/performed              Past Medical History:  Diagnosis Date   Arthritis    bilateral knees   Diabetes mellitus without complication (HCC)    Erectile dysfunction    Hyperlipidemia    Hypertension    Restless leg syndrome    Shingles    Past Surgical History:  Procedure Laterality Date   JOINT REPLACEMENT Right    ankle   SPINE SURGERY     lower back   Patient Active Problem List   Diagnosis Date Noted   Lumbar radiculopathy 04/02/2020   Myalgia due to statin 01/03/2018   Arthritis    Hyperlipidemia associated with type 2 diabetes mellitus (HCC)    Morbid obesity (HCC) 02/14/2013   Erectile dysfunction 02/14/2013   Hypertension associated with diabetes (HCC) 11/09/2010   DM (diabetes mellitus) (HCC) 11/09/2010   REFERRING PROVIDER: Marikay Show, NP   REFERRING DIAG: Radiculopathy, lumbar region   Rationale for Evaluation and Treatment: Rehabilitation  THERAPY DIAG:  Muscle weakness (generalized)  Other low back pain  Pain in right leg  ONSET DATE: February 2025  SUBJECTIVE:                                                                                                                                                                                           SUBJECTIVE STATEMENT: No pain.  PERTINENT HISTORY:  Hypertension, diabetes, arthritis, current smoker, and previous lumbar surgery  PAIN:  Are you having pain? Yes: NPRS scale: no pain score provided Pain location: right  leg Pain description: primarily weakness  PRECAUTIONS: None  RED FLAGS: None   WEIGHT BEARING RESTRICTIONS: No  FALLS:  Has patient fallen in last 6 months? No  LIVING ENVIRONMENT: Lives with: lives with their son Lives in: House/apartment Stairs: No Has following equipment at home: None  OCCUPATION: retired  PLOF: Independent  PATIENT GOALS: improved strength  NEXT MD VISIT: 12/15/23  OBJECTIVE:  Note: Objective measures were completed at Evaluation unless otherwise noted.  PATIENT SURVEYS:  Modified Oswestry 40%  disability   COGNITION: Overall cognitive status: Within functional limits for tasks assessed     SENSATION: Patient reports no numbness or tingling  LUMBAR ROM:   AROM eval  Flexion 60  Extension 10  Right lateral flexion 75% limited; unable to isolate lateral flexion  Left lateral flexion 75% limited; unable to isolate lateral flexion   Right rotation 50% limited  Left rotation 50% limited   (Blank rows = not tested)  LOWER EXTREMITY ROM: WFL for activities assessed  LOWER EXTREMITY MMT:    MMT Right eval Left eval  Hip flexion 4/5 4-/5  Hip extension    Hip abduction    Hip adduction    Hip internal rotation    Hip external rotation    Knee flexion 4/5 4/5  Knee extension 4/5 4/5  Ankle dorsiflexion 4-/5 4-/5  Ankle plantarflexion    Ankle inversion    Ankle eversion     (Blank rows = not tested)  FUNCTIONAL TESTS:  5 times sit to stand: 32.81 seconds with upper extremity from the armrests Sit to stand: requires upper extremity assistance from armrest, multiple attempts required   GAIT: Assistive device utilized: None Level of assistance: Complete Independence  TREATMENT DATE:       12/06/23:                                     EXERCISE LOG  Exercise Repetitions and Resistance Comments  Nustep Level 4 x 20 minutes   Back ext 60# x 4 minutes   Ab curls 60# x 4 minutes   Knee ext 10# x 4 minutes   Ham curls 40# x 4  minutes   Leg Press 2 plates x 2 minutes                                       12/04/23 EXERCISE LOG  Exercise Repetitions and Resistance Comments  Nustep  L6 x 18 minutes   Cybex knee flexion  60# x 2.5 minutes   Cybex knee extension  20# x 2 minutes    Heel raise  2 minutes  From inclined wedge  Standing hip ABD 2 minutes  Alternating LE  Standing hip extension  2 minutes  Alternating LE  Sit to stand  25 reps  Without UE support  Seated hip ADD isometric  3 minutes w/ 5 second hold   Step up onto BOSU Ball up x 3 minutes   Rocker board  4.5 minutes   Tandem walking on foam  10 laps  Forward and backward; BUE support on foam        Blank cell = exercise not performed today   11/29/23 EXERCISE LOG  Exercise Repetitions and Resistance Comments  Nustep Level 4 x 17 minutes   Back ext machine 60# with draw-in activation x 4 minutes   Ab curls  60# with draw-in activation x 4 minutes            HMP and IFC at 80-150 Hz on 40% scan x 20 minutes to patient's low back.  Normal modality response following removal of modality.    11/27/23  EXERCISE LOG  Exercise Repetitions and Resistance Comments  Nustep  Lvl 4 x 15 mins   Cybex Knee Flexion 50# x 3.5 mins   Cybex Knee Extension 20# x 3.5 mins   Rockerboard 3 mins   Lunges 14" box x 3 mins   Standing Hip Abduction 25 reps bil   Standing Marches 25 reps bil    Blank cell = exercise not performed today   Modalities  Date:  Unattended Estim: Lumbar, IFC 80-150 Hz, 15 mins, Pain Hot Pack: Lumbar, 15 mins, Pain   PATIENT EDUCATION:   Education details: POC  Person educated: Patient Education method: Explanation Education comprehension: verbalized understanding  HOME EXERCISE PROGRAM:    ASSESSMENT:  CLINICAL IMPRESSION:  Patient presented to clinic with no pain and ready for d/c.  OBJECTIVE IMPAIRMENTS: decreased activity tolerance, decreased mobility, decreased ROM, decreased  strength, and pain.   ACTIVITY LIMITATIONS: lifting, stairs, and transfers  PARTICIPATION LIMITATIONS: community activity and yard work  PERSONAL FACTORS: Past/current experiences and 3+ comorbidities: Hypertension, diabetes, arthritis, current smoker, and previous lumbar surgery  are also affecting patient's functional outcome.   REHAB POTENTIAL: Good  CLINICAL DECISION MAKING: Evolving/moderate complexity  EVALUATION COMPLEXITY: Moderate   GOALS: Goals reviewed with patient? Yes  SHORT TERM GOALS: Target date: 11/28/23  Patient will be independent with his initial HEP. Baseline: Goal status: MET  2.  Patient will improve his 5 times sit to stand time to 20 seconds or less for improved lower extremity power. Baseline:  Goal status: INITIAL  LONG TERM GOALS: Target date: 12/19/23  Patient will be independent with his advanced HEP. Baseline:  Goal status: MET  2.  Patient will improve his 5 times sit to stand time to 12 seconds or less to reduce his fall risk. Baseline:  Goal status: MET. (12 seconds).   3.  Patient will be able to transfer from sitting to standing without upper extremity assistance for improved household independence. Baseline:  Goal status: MET  4.  Patient will improve his modified Oswestry score to 30% disability or less for improved perceived function with his daily activities. Baseline:  Goal status: 1/50 (2%).  PLAN:  PT FREQUENCY: 1-2x/week  PT DURATION: 6 weeks  PLANNED INTERVENTIONS: 97164- PT Re-evaluation, 97110-Therapeutic exercises, 97530- Therapeutic activity, 97112- Neuromuscular re-education, 97535- Self Care, 16109- Manual therapy, G0283- Electrical stimulation (unattended), Patient/Family education, Balance training, Stair training, Joint mobilization, Spinal mobilization, Cryotherapy, and Moist heat.  PLAN FOR NEXT SESSION: Nustep, lumbar and lower extremity strengthening and power interventions  PHYSICAL THERAPY DISCHARGE  SUMMARY  Visits from Start of Care: 8.  Current functional level related to goals / functional outcomes: See above.   Remaining deficits: All goals met.   Education / Equipment: HEP.   Patient agrees to discharge. Patient goals were met. Patient is being discharged due to meeting the stated rehab goals.    Lavaya Defreitas, [place], PT 12/06/2023, 10:01 AM

## 2023-12-06 NOTE — Therapy (Signed)
 OUTPATIENT PHYSICAL THERAPY THORACOLUMBAR TREATMENT   Patient Name: Brett Jensen MRN: [id number] DOB:10/09/1945, 78 y.o., male Today's Date: 12/06/2023  END OF SESSION:     Past Medical History:  Diagnosis Date   Arthritis    bilateral knees   Diabetes mellitus without complication (HCC)    Erectile dysfunction    Hyperlipidemia    Hypertension    Restless leg syndrome    Shingles    Past Surgical History:  Procedure Laterality Date   JOINT REPLACEMENT Right    ankle   SPINE SURGERY     lower back   Patient Active Problem List   Diagnosis Date Noted   Lumbar radiculopathy 04/02/2020   Myalgia due to statin 01/03/2018   Arthritis    Hyperlipidemia associated with type 2 diabetes mellitus (HCC)    Morbid obesity (HCC) 02/14/2013   Erectile dysfunction 02/14/2013   Hypertension associated with diabetes (HCC) 11/09/2010   DM (diabetes mellitus) (HCC) 11/09/2010   REFERRING PROVIDER: Marikay Show, NP   REFERRING DIAG: Radiculopathy, lumbar region   Rationale for Evaluation and Treatment: Rehabilitation  THERAPY DIAG:  Muscle weakness (generalized)  Other low back pain  Pain in right leg  ONSET DATE: February 2025  SUBJECTIVE:                                                                                                                                                                                           SUBJECTIVE STATEMENT: Patient notes that his back feels alright, but his legs still feel weak. He feels that he is about 80% back to his prior level of function.   PERTINENT HISTORY:  Hypertension, diabetes, arthritis, current smoker, and previous lumbar surgery  PAIN:  Are you having pain? Yes: NPRS scale: no pain score provided Pain location: right leg Pain description: primarily weakness  PRECAUTIONS: None  RED FLAGS: None   WEIGHT BEARING RESTRICTIONS: No  FALLS:  Has patient fallen in last 6 months? No  LIVING ENVIRONMENT: Lives  with: lives with their son Lives in: House/apartment Stairs: No Has following equipment at home: None  OCCUPATION: retired  PLOF: Independent  PATIENT GOALS: improved strength  NEXT MD VISIT: 12/15/23  OBJECTIVE:  Note: Objective measures were completed at Evaluation unless otherwise noted.  PATIENT SURVEYS:  Modified Oswestry 40% disability   COGNITION: Overall cognitive status: Within functional limits for tasks assessed     SENSATION: Patient reports no numbness or tingling  LUMBAR ROM:   AROM eval  Flexion 60  Extension 10  Right lateral flexion 75% limited; unable to isolate lateral flexion  Left lateral flexion 75% limited; unable  to isolate lateral flexion   Right rotation 50% limited  Left rotation 50% limited   (Blank rows = not tested)  LOWER EXTREMITY ROM: WFL for activities assessed  LOWER EXTREMITY MMT:    MMT Right eval Left eval  Hip flexion 4/5 4-/5  Hip extension    Hip abduction    Hip adduction    Hip internal rotation    Hip external rotation    Knee flexion 4/5 4/5  Knee extension 4/5 4/5  Ankle dorsiflexion 4-/5 4-/5  Ankle plantarflexion    Ankle inversion    Ankle eversion     (Blank rows = not tested)  FUNCTIONAL TESTS:  5 times sit to stand: 32.81 seconds with upper extremity from the armrests Sit to stand: requires upper extremity assistance from armrest, multiple attempts required   GAIT: Assistive device utilized: None Level of assistance: Complete Independence  TREATMENT DATE:                                        12/04/23 EXERCISE LOG  Exercise Repetitions and Resistance Comments  Nustep  L6 x 18 minutes   Cybex knee flexion  60# x 2.5 minutes   Cybex knee extension  20# x 2 minutes    Heel raise  2 minutes  From inclined wedge  Standing hip ABD 2 minutes  Alternating LE  Standing hip extension  2 minutes  Alternating LE  Sit to stand  25 reps  Without UE support  Seated hip ADD isometric  3 minutes w/ 5 second  hold   Step up onto BOSU Ball up x 3 minutes   Rocker board  4.5 minutes   Tandem walking on foam  10 laps  Forward and backward; BUE support on foam        Blank cell = exercise not performed today   11/29/23 EXERCISE LOG  Exercise Repetitions and Resistance Comments  Nustep Level 4 x 17 minutes   Back ext machine 60# with draw-in activation x 4 minutes   Ab curls  60# with draw-in activation x 4 minutes            HMP and IFC at 80-150 Hz on 40% scan x 20 minutes to patient's low back.  Normal modality response following removal of modality.    11/27/23                                  EXERCISE LOG  Exercise Repetitions and Resistance Comments  Nustep  Lvl 4 x 15 mins   Cybex Knee Flexion 50# x 3.5 mins   Cybex Knee Extension 20# x 3.5 mins   Rockerboard 3 mins   Lunges 14" box x 3 mins   Standing Hip Abduction 25 reps bil   Standing Marches 25 reps bil    Blank cell = exercise not performed today   Modalities  Date:  Unattended Estim: Lumbar, IFC 80-150 Hz, 15 mins, Pain Hot Pack: Lumbar, 15 mins, Pain   PATIENT EDUCATION:   Education details: POC  Person educated: Patient Education method: Explanation Education comprehension: verbalized understanding  HOME EXERCISE PROGRAM:    ASSESSMENT:  CLINICAL IMPRESSION:  Patient was progressed with multiple new interventions for improved muscular strength and endurance needed to improve his functional mobility. He required minimal cueing with today's weighted interventions  for improved eccentric control. He experienced a mild increase in fatigue with today's interventions, but this did not limit his ability to complete any of today's interventions. He reported feeling good upon the conclusion of treatment. He continues to require skilled physical therapy to address his remaining impairments to return to his prior level of function.   OBJECTIVE IMPAIRMENTS: decreased activity tolerance, decreased mobility, decreased ROM,  decreased strength, and pain.   ACTIVITY LIMITATIONS: lifting, stairs, and transfers  PARTICIPATION LIMITATIONS: community activity and yard work  PERSONAL FACTORS: Past/current experiences and 3+ comorbidities: Hypertension, diabetes, arthritis, current smoker, and previous lumbar surgery  are also affecting patient's functional outcome.   REHAB POTENTIAL: Good  CLINICAL DECISION MAKING: Evolving/moderate complexity  EVALUATION COMPLEXITY: Moderate   GOALS: Goals reviewed with patient? Yes  SHORT TERM GOALS: Target date: 11/28/23  Patient will be independent with his initial HEP. Baseline: Goal status: MET  2.  Patient will improve his 5 times sit to stand time to 20 seconds or less for improved lower extremity power. Baseline:  Goal status: INITIAL  LONG TERM GOALS: Target date: 12/19/23  Patient will be independent with his advanced HEP. Baseline:  Goal status: MET  2.  Patient will improve his 5 times sit to stand time to 12 seconds or less to reduce his fall risk. Baseline:  Goal status: INITIAL  3.  Patient will be able to transfer from sitting to standing without upper extremity assistance for improved household independence. Baseline:  Goal status: INITIAL  4.  Patient will improve his modified Oswestry score to 30% disability or less for improved perceived function with his daily activities. Baseline:  Goal status: INITIAL  PLAN:  PT FREQUENCY: 1-2x/week  PT DURATION: 6 weeks  PLANNED INTERVENTIONS: 97164- PT Re-evaluation, 97110-Therapeutic exercises, 97530- Therapeutic activity, 97112- Neuromuscular re-education, 97535- Self Care, 96045- Manual therapy, [id number]- Electrical stimulation (unattended), Patient/Family education, Balance training, Stair training, Joint mobilization, Spinal mobilization, Cryotherapy, and Moist heat.  PLAN FOR NEXT SESSION: Nustep, lumbar and lower extremity strengthening and power interventions   Jersi Mcmaster, [place], PT 12/06/2023,  9:01 AM

## 2023-12-15 DIAGNOSIS — M5126 Other intervertebral disc displacement, lumbar region: Secondary | ICD-10-CM | POA: Diagnosis not present

## 2023-12-15 DIAGNOSIS — M47816 Spondylosis without myelopathy or radiculopathy, lumbar region: Secondary | ICD-10-CM | POA: Diagnosis not present

## 2023-12-15 DIAGNOSIS — M5416 Radiculopathy, lumbar region: Secondary | ICD-10-CM | POA: Diagnosis not present

## 2023-12-15 DIAGNOSIS — M48061 Spinal stenosis, lumbar region without neurogenic claudication: Secondary | ICD-10-CM | POA: Diagnosis not present

## 2024-02-12 ENCOUNTER — Encounter: Payer: Self-pay | Admitting: Family Medicine

## 2024-02-12 ENCOUNTER — Ambulatory Visit: Admitting: Family Medicine

## 2024-02-12 VITALS — BP 146/75 | HR 59 | Ht 72.0 in | Wt 306.0 lb

## 2024-02-12 DIAGNOSIS — I152 Hypertension secondary to endocrine disorders: Secondary | ICD-10-CM

## 2024-02-12 DIAGNOSIS — E785 Hyperlipidemia, unspecified: Secondary | ICD-10-CM | POA: Diagnosis not present

## 2024-02-12 DIAGNOSIS — E1169 Type 2 diabetes mellitus with other specified complication: Secondary | ICD-10-CM | POA: Diagnosis not present

## 2024-02-12 DIAGNOSIS — E1159 Type 2 diabetes mellitus with other circulatory complications: Secondary | ICD-10-CM

## 2024-02-12 LAB — BAYER DCA HB A1C WAIVED: HB A1C (BAYER DCA - WAIVED): 6.2 % — ABNORMAL HIGH (ref 4.8–5.6)

## 2024-02-12 NOTE — Progress Notes (Signed)
 BP (!) 146/75   Pulse (!) 59   Ht 6' (1.829 m)   Wt (!) 306 lb (138.8 kg)   SpO2 97%   BMI 41.50 kg/m    Subjective:   Patient ID: Brett Jensen, male    DOB: November 17, 1945, 78 y.o.   MRN: [id number]  HPI: Brett Jensen is a 78 y.o. male presenting on 02/12/2024 for Medical Management of Chronic Issues, Hyperlipidemia, and Diabetes   HPI Type 2 diabetes mellitus Patient comes in today for recheck of his diabetes. Patient has been currently taking no medication, has been diet controlled, he does say that his blood work showing up just a little bit recently. Patient is currently on an ACE inhibitor/ARB. Patient has not seen an ophthalmologist this year. Patient denies any new issues with their feet. The symptom started onset as an adult hypertension and hyperlipidemia ARE RELATED TO DM   Hypertension Patient is currently on lisinopril , and their blood pressure today is 46/75. Patient denies any lightheadedness or dizziness. Patient denies headaches, blurred vision, chest pains, shortness of breath, or weakness. Denies any side effects from medication and is content with current medication.   Hyperlipidemia Patient is coming in for recheck of his hyperlipidemia. The patient is currently taking Crestor . They deny any issues with myalgias or history of liver damage from it. They deny any focal numbness or weakness or chest pain.   Relevant past medical, surgical, family and social history reviewed and updated as indicated. Interim medical history since our last visit reviewed. Allergies and medications reviewed and updated.  Review of Systems  Constitutional:  Negative for chills and fever.  Eyes:  Negative for visual disturbance.  Respiratory:  Negative for shortness of breath and wheezing.   Cardiovascular:  Negative for chest pain and leg swelling.  Musculoskeletal:  Negative for back pain and gait problem.  Skin:  Negative for rash.  Neurological:  Negative for dizziness and  light-headedness.  All other systems reviewed and are negative.   Per HPI unless specifically indicated above   Allergies as of 02/12/2024   No Known Allergies      Medication List        Accurate as of February 12, 2024 11:01 AM. If you have any questions, ask your nurse or doctor.          STOP taking these medications    sildenafil  20 MG tablet Commonly known as: REVATIO  Stopped by: F[facility] Sharai [place]       TAKE these medications    Accu-Chek Aviva Plus test strip Generic drug: glucose blood Check BS twice daily Dx E11.69   Accu-Chek Aviva Plus w/Device Kit 1 Device by Does not apply route 2 (two) times daily at 10 AM and 5 PM.   aspirin EC 81 MG tablet Take 81 mg by mouth daily.   desonide  0.05 % cream Commonly known as: DesOwen  Apply topically 2 (two) times daily.   lisinopril  5 MG tablet Commonly known as: ZESTRIL  Take 1 tablet (5 mg total) by mouth daily.   pioglitazone  30 MG tablet Commonly known as: ACTOS  Take 1 tablet (30 mg total) by mouth daily.   rosuvastatin  5 MG tablet Commonly known as: CRESTOR  Take 1 tablet (5 mg total) by mouth at bedtime.   traZODone  50 MG tablet Commonly known as: DESYREL  Take 0.5-1 tablets (25-50 mg total) by mouth at bedtime as needed for sleep.         Objective:   BP (!) 146/75  Pulse (!) 59   Ht 6' (1.829 m)   Wt (!) 306 lb (138.8 kg)   SpO2 97%   BMI 41.50 kg/m   Wt Readings from Last 3 Encounters:  02/12/24 (!) 306 lb (138.8 kg)  10/26/23 (!) 301 lb (136.5 kg)  08/11/23 (!) 301 lb (136.5 kg)    Physical Exam Vitals and nursing note reviewed.  Constitutional:      General: He is not in acute distress.    Appearance: He is well-developed. He is not diaphoretic.  Eyes:     General: No scleral icterus.    Conjunctiva/sclera: Conjunctivae normal.  Neck:     Thyroid : No thyromegaly.  Cardiovascular:     Rate and Rhythm: Normal rate and regular rhythm.     Heart sounds: Normal heart sounds.  No murmur heard. Pulmonary:     Effort: Pulmonary effort is normal. No respiratory distress.     Breath sounds: Normal breath sounds. No wheezing.  Musculoskeletal:        General: Normal range of motion.     Cervical back: Neck supple.  Lymphadenopathy:     Cervical: No cervical adenopathy.  Skin:    General: Skin is warm and dry.     Findings: No rash.  Neurological:     Mental Status: He is alert and oriented to person, place, and time.     Coordination: Coordination normal.  Psychiatric:        Behavior: Behavior normal.       Assessment & Plan:   Problem List Items Addressed This Visit       Cardiovascular and Mediastinum   Hypertension associated with diabetes (HCC)     Endocrine   DM (diabetes mellitus) (HCC) - Primary   Relevant Orders   Bayer DCA Hb A1c Waived   Hyperlipidemia associated with type 2 diabetes mellitus (HCC)    A1c looks decent so at 6.2, it was a little higher to where he normally was. Follow up plan: Return in about 3 months (around 05/14/2024), or if symptoms worsen or fail to improve, for Diabetes.  Counseling provided for all of the vaccine components Orders Placed This Encounter  Procedures   Bayer DCA Hb A1c Waived    Fonda Levins, MD [facility] 02/12/2024, 11:01 AM

## 2024-03-18 DIAGNOSIS — M48061 Spinal stenosis, lumbar region without neurogenic claudication: Secondary | ICD-10-CM | POA: Diagnosis not present

## 2024-03-18 DIAGNOSIS — M5126 Other intervertebral disc displacement, lumbar region: Secondary | ICD-10-CM | POA: Diagnosis not present

## 2024-03-18 DIAGNOSIS — M5416 Radiculopathy, lumbar region: Secondary | ICD-10-CM | POA: Diagnosis not present

## 2024-03-28 DIAGNOSIS — M5116 Intervertebral disc disorders with radiculopathy, lumbar region: Secondary | ICD-10-CM | POA: Diagnosis not present

## 2024-03-28 DIAGNOSIS — M48061 Spinal stenosis, lumbar region without neurogenic claudication: Secondary | ICD-10-CM | POA: Diagnosis not present

## 2024-03-28 DIAGNOSIS — M5126 Other intervertebral disc displacement, lumbar region: Secondary | ICD-10-CM | POA: Diagnosis not present

## 2024-03-28 DIAGNOSIS — M5416 Radiculopathy, lumbar region: Secondary | ICD-10-CM | POA: Diagnosis not present

## 2024-04-23 DIAGNOSIS — M5126 Other intervertebral disc displacement, lumbar region: Secondary | ICD-10-CM | POA: Diagnosis not present

## 2024-04-23 DIAGNOSIS — M48061 Spinal stenosis, lumbar region without neurogenic claudication: Secondary | ICD-10-CM | POA: Diagnosis not present

## 2024-04-23 DIAGNOSIS — M5416 Radiculopathy, lumbar region: Secondary | ICD-10-CM | POA: Diagnosis not present

## 2024-05-15 ENCOUNTER — Ambulatory Visit: Admitting: Family Medicine

## 2024-05-15 ENCOUNTER — Encounter: Payer: Self-pay | Admitting: Family Medicine

## 2024-05-15 VITALS — BP 127/64 | HR 58 | Ht 72.0 in | Wt 300.0 lb

## 2024-05-15 DIAGNOSIS — I152 Hypertension secondary to endocrine disorders: Secondary | ICD-10-CM | POA: Diagnosis not present

## 2024-05-15 DIAGNOSIS — E1159 Type 2 diabetes mellitus with other circulatory complications: Secondary | ICD-10-CM | POA: Diagnosis not present

## 2024-05-15 DIAGNOSIS — E1169 Type 2 diabetes mellitus with other specified complication: Secondary | ICD-10-CM | POA: Diagnosis not present

## 2024-05-15 DIAGNOSIS — E785 Hyperlipidemia, unspecified: Secondary | ICD-10-CM | POA: Diagnosis not present

## 2024-05-15 DIAGNOSIS — R600 Localized edema: Secondary | ICD-10-CM | POA: Diagnosis not present

## 2024-05-15 DIAGNOSIS — Z7984 Long term (current) use of oral hypoglycemic drugs: Secondary | ICD-10-CM | POA: Diagnosis not present

## 2024-05-15 LAB — CBC WITH DIFFERENTIAL/PLATELET
Basophils Absolute: 0 x10E3/uL (ref 0.0–0.2)
Basos: 1 %
EOS (ABSOLUTE): 0.3 x10E3/uL (ref 0.0–0.4)
Eos: 5 %
Hematocrit: 41.6 % (ref 37.5–51.0)
Hemoglobin: 13.4 g/dL (ref 13.0–17.7)
Immature Grans (Abs): 0 x10E3/uL (ref 0.0–0.1)
Immature Granulocytes: 0 %
Lymphocytes Absolute: 1.9 x10E3/uL (ref 0.7–3.1)
Lymphs: 32 %
MCH: 30.9 pg (ref 26.6–33.0)
MCHC: 32.2 g/dL (ref 31.5–35.7)
MCV: 96 fL (ref 79–97)
Monocytes Absolute: 0.5 x10E3/uL (ref 0.1–0.9)
Monocytes: 9 %
Neutrophils Absolute: 3.2 x10E3/uL (ref 1.4–7.0)
Neutrophils: 53 %
Platelets: 247 x10E3/uL (ref 150–450)
RBC: 4.33 x10E6/uL (ref 4.14–5.80)
RDW: 12.4 % (ref 11.6–15.4)
WBC: 6 x10E3/uL (ref 3.4–10.8)

## 2024-05-15 LAB — CMP14+EGFR
ALT: 14 IU/L (ref 0–44)
AST: 22 IU/L (ref 0–40)
Albumin: 4.2 g/dL (ref 3.8–4.8)
Alkaline Phosphatase: 63 IU/L (ref 47–123)
BUN/Creatinine Ratio: 15 (ref 10–24)
BUN: 17 mg/dL (ref 8–27)
Bilirubin Total: 1 mg/dL (ref 0.0–1.2)
CO2: 24 mmol/L (ref 20–29)
Calcium: 9.7 mg/dL (ref 8.6–10.2)
Chloride: 101 mmol/L (ref 96–106)
Creatinine, Ser: 1.17 mg/dL (ref 0.76–1.27)
Globulin, Total: 2.6 g/dL (ref 1.5–4.5)
Glucose: 101 mg/dL — ABNORMAL HIGH (ref 70–99)
Potassium: 4.9 mmol/L (ref 3.5–5.2)
Sodium: 137 mmol/L (ref 134–144)
Total Protein: 6.8 g/dL (ref 6.0–8.5)
eGFR: 64 mL/min/1.73 (ref >59)

## 2024-05-15 LAB — LIPID PANEL
Chol/HDL Ratio: 3.9 ratio (ref 0.0–5.0)
Cholesterol, Total: 158 mg/dL (ref 100–199)
HDL: 41 mg/dL (ref >39)
LDL Chol Calc (NIH): 95 mg/dL (ref 0–99)
Triglycerides: 123 mg/dL (ref 0–149)
VLDL Cholesterol Cal: 22 mg/dL (ref 5–40)

## 2024-05-15 LAB — BAYER DCA HB A1C WAIVED: HB A1C (BAYER DCA - WAIVED): 5.6 % (ref 4.8–5.6)

## 2024-05-15 NOTE — Progress Notes (Signed)
 BP 127/64   Pulse (!) 58   Ht 6' (1.829 m)   Wt 300 lb (136.1 kg)   SpO2 97%   BMI 40.69 kg/m    Subjective:   Patient ID: Brett Jensen, male    DOB: [birth year], 78 y.o.   MRN: [id number]  HPI: Brett Jensen is a 78 y.o. male presenting on 05/15/2024 for Medical Management of Chronic Issues and Diabetes   Discussed the use of AI scribe software for clinical note transcription with the patient, who gave verbal consent to proceed.  History of Present Illness   Brett Jensen is a 78 year old male with diabetes who presents for a recheck of his condition.  Left foot plantar pain and swelling - Soreness localized to the plantar aspect of the left foot, present only with ambulation - No pain at rest - Pain is non-radiating and distributed across the bottom of the foot - Possible swelling of the left foot - No pain with palpation of the left foot - No sores present on the left foot - No pain in the right foot  Diabetes mellitus management - Currently taking Actos  for glycemic control - Recent hemoglobin A1c is 5.6  Chronic disease medication use - Takes a cholesterol medication almost daily - Takes lisinopril  5 mg for blood pressure management          Relevant past medical, surgical, family and social history reviewed and updated as indicated. Interim medical history since our last visit reviewed. Allergies and medications reviewed and updated.  Review of Systems  Constitutional:  Negative for chills and fever.  Respiratory:  Negative for shortness of breath and wheezing.   Cardiovascular:  Positive for leg swelling. Negative for chest pain.  Musculoskeletal:  Negative for back pain and gait problem.  Skin:  Negative for rash.  All other systems reviewed and are negative.   Per HPI unless specifically indicated above   Allergies as of 05/15/2024   No Known Allergies      Medication List        Accurate as of May 15, 2024  9:55 AM. If you have any  questions, ask your nurse or doctor.          Accu-Chek Aviva Plus test strip Generic drug: glucose blood Check BS twice daily Dx E11.69   Accu-Chek Aviva Plus w/Device Kit 1 Device by Does not apply route 2 (two) times daily at 10 AM and 5 PM.   aspirin EC 81 MG tablet Take 81 mg by mouth daily.   desonide  0.05 % cream Commonly known as: DesOwen  Apply topically 2 (two) times daily.   lisinopril  5 MG tablet Commonly known as: ZESTRIL  Take 1 tablet (5 mg total) by mouth daily.   pioglitazone  30 MG tablet Commonly known as: ACTOS  Take 1 tablet (30 mg total) by mouth daily.   rosuvastatin  5 MG tablet Commonly known as: CRESTOR  Take 1 tablet (5 mg total) by mouth at bedtime.   traZODone  50 MG tablet Commonly known as: DESYREL  Take 0.5-1 tablets (25-50 mg total) by mouth at bedtime as needed for sleep.         Objective:   BP 127/64   Pulse (!) 58   Ht 6' (1.829 m)   Wt 300 lb (136.1 kg)   SpO2 97%   BMI 40.69 kg/m   Wt Readings from Last 3 Encounters:  05/15/24 300 lb (136.1 kg)  02/12/24 (!) 306 lb (138.8 kg)  10/26/23 ROLLEN)  301 lb (136.5 kg)    Physical Exam Physical Exam   VITALS: BP- 127/64 CHEST: Lungs clear to auscultation. CARDIOVASCULAR: Regular rate and rhythm, no murmurs. EXTREMITIES: 2+ pitting edema in legs.       Results for orders placed or performed in visit on 02/12/24  Bayer DCA Hb A1c Waived   Collection Time: 02/12/24 10:11 AM  Result Value Ref Range   HB A1C (BAYER DCA - WAIVED) 6.2 (H) 4.8 - 5.6 %    Assessment & Plan:   Problem List Items Addressed This Visit       Cardiovascular and Mediastinum   Hypertension associated with diabetes (HCC) - Primary   Relevant Orders   CBC with Differential/Platelet   CMP14+EGFR   Lipid panel   Bayer DCA Hb A1c Waived     Endocrine   DM (diabetes mellitus) (HCC)   Hyperlipidemia associated with type 2 diabetes mellitus (HCC)   Relevant Orders   CBC with Differential/Platelet    CMP14+EGFR   Lipid panel   Bayer DCA Hb A1c Waived   Other Visit Diagnoses       Peripheral edema              Bilateral lower extremity edema Likely due to fluid retention. Differential diagnosis includes plantar fasciitis, but less likely due to absence of pain on palpation. - Provide ACE wraps for compression. - Instruct to wrap legs with ACE bandages during the day and cover at night. - Advise to elevate feet at home using an extra pillow.  Hypertension Well-controlled with lisinopril  5 mg daily.  Type 2 diabetes mellitus Well-controlled with an A1c of 5.6, improved from previous level of 6.1.  Hyperlipidemia Managed with cholesterol-lowering medication. Awaiting current blood work results to assess lipid levels.          Follow up plan: Return in about 3 months (around 08/15/2024), or if symptoms worsen or fail to improve, for Diabetes.  Counseling provided for all of the vaccine components Orders Placed This Encounter  Procedures   CBC with Differential/Platelet   CMP14+EGFR   Lipid panel   Bayer DCA Hb A1c Waived    Fonda Levins, MD [facility] 05/15/2024, 9:55 AM

## 2024-05-22 ENCOUNTER — Ambulatory Visit: Payer: Self-pay | Admitting: Family Medicine

## 2024-08-30 ENCOUNTER — Ambulatory Visit: Payer: Self-pay | Admitting: Family Medicine

## 2024-09-25 ENCOUNTER — Ambulatory Visit: Admitting: Family Medicine
